# Patient Record
Sex: Female | Born: 1940 | Race: White | Hispanic: No | Smoking: Former smoker
Health system: Southern US, Community
[De-identification: ages and names within clinical notes are randomized; demographics above are authoritative.]

## PROBLEM LIST (undated history)

## (undated) DIAGNOSIS — F419 Anxiety disorder, unspecified: Secondary | ICD-10-CM

## (undated) DIAGNOSIS — E785 Hyperlipidemia, unspecified: Secondary | ICD-10-CM

## (undated) DIAGNOSIS — F329 Major depressive disorder, single episode, unspecified: Secondary | ICD-10-CM

## (undated) DIAGNOSIS — F32A Depression, unspecified: Secondary | ICD-10-CM

## (undated) DIAGNOSIS — G43909 Migraine, unspecified, not intractable, without status migrainosus: Secondary | ICD-10-CM

## (undated) DIAGNOSIS — G8929 Other chronic pain: Secondary | ICD-10-CM

## (undated) DIAGNOSIS — K559 Vascular disorder of intestine, unspecified: Secondary | ICD-10-CM

## (undated) DIAGNOSIS — H269 Unspecified cataract: Secondary | ICD-10-CM

## (undated) DIAGNOSIS — T7840XA Allergy, unspecified, initial encounter: Secondary | ICD-10-CM

## (undated) DIAGNOSIS — M1990 Unspecified osteoarthritis, unspecified site: Secondary | ICD-10-CM

## (undated) DIAGNOSIS — M545 Low back pain, unspecified: Secondary | ICD-10-CM

## (undated) DIAGNOSIS — I341 Nonrheumatic mitral (valve) prolapse: Secondary | ICD-10-CM

## (undated) DIAGNOSIS — K219 Gastro-esophageal reflux disease without esophagitis: Secondary | ICD-10-CM

## (undated) DIAGNOSIS — K589 Irritable bowel syndrome without diarrhea: Secondary | ICD-10-CM

## (undated) HISTORY — DX: Anxiety disorder, unspecified: F41.9

## (undated) HISTORY — DX: Vascular disorder of intestine, unspecified: K55.9

## (undated) HISTORY — PX: BREAST BIOPSY: SHX20

## (undated) HISTORY — PX: APPENDECTOMY: SHX54

## (undated) HISTORY — DX: Depression, unspecified: F32.A

## (undated) HISTORY — PX: EYE SURGERY: SHX253

## (undated) HISTORY — DX: Unspecified cataract: H26.9

## (undated) HISTORY — DX: Irritable bowel syndrome, unspecified: K58.9

## (undated) HISTORY — DX: Nonrheumatic mitral (valve) prolapse: I34.1

## (undated) HISTORY — PX: TONSILLECTOMY AND ADENOIDECTOMY: SHX28

## (undated) HISTORY — DX: Low back pain, unspecified: G89.29

## (undated) HISTORY — DX: Allergy, unspecified, initial encounter: T78.40XA

## (undated) HISTORY — DX: Low back pain, unspecified: M54.50

## (undated) HISTORY — DX: Unspecified osteoarthritis, unspecified site: M19.90

## (undated) HISTORY — DX: Major depressive disorder, single episode, unspecified: F32.9

## (undated) HISTORY — DX: Low back pain: M54.5

## (undated) HISTORY — PX: OTHER SURGICAL HISTORY: SHX169

## (undated) HISTORY — DX: Migraine, unspecified, not intractable, without status migrainosus: G43.909

## (undated) HISTORY — DX: Hyperlipidemia, unspecified: E78.5

---

## 1989-10-23 HISTORY — PX: ABDOMINAL HYSTERECTOMY: SHX81

## 1991-11-18 HISTORY — PX: HEMILAMINOTOMY LUMBAR SPINE: SUR654

## 1993-10-23 HISTORY — PX: OTHER SURGICAL HISTORY: SHX169

## 1997-08-18 HISTORY — PX: SPINAL FUSION: SHX223

## 2004-10-23 HISTORY — PX: ESOPHAGOGASTRODUODENOSCOPY: SHX1529

## 2005-10-23 ENCOUNTER — Encounter: Payer: Self-pay | Admitting: Family Medicine

## 2008-02-17 ENCOUNTER — Ambulatory Visit: Payer: Self-pay | Admitting: Family Medicine

## 2008-02-17 ENCOUNTER — Telehealth (INDEPENDENT_AMBULATORY_CARE_PROVIDER_SITE_OTHER): Payer: Self-pay | Admitting: *Deleted

## 2008-02-17 DIAGNOSIS — N809 Endometriosis, unspecified: Secondary | ICD-10-CM | POA: Insufficient documentation

## 2008-02-17 DIAGNOSIS — M5137 Other intervertebral disc degeneration, lumbosacral region: Secondary | ICD-10-CM | POA: Insufficient documentation

## 2008-02-17 DIAGNOSIS — F331 Major depressive disorder, recurrent, moderate: Secondary | ICD-10-CM | POA: Insufficient documentation

## 2008-02-17 DIAGNOSIS — F339 Major depressive disorder, recurrent, unspecified: Secondary | ICD-10-CM | POA: Insufficient documentation

## 2008-02-17 DIAGNOSIS — Z8719 Personal history of other diseases of the digestive system: Secondary | ICD-10-CM | POA: Insufficient documentation

## 2008-02-25 ENCOUNTER — Ambulatory Visit: Payer: Self-pay | Admitting: Family Medicine

## 2008-02-25 DIAGNOSIS — R310 Gross hematuria: Secondary | ICD-10-CM | POA: Insufficient documentation

## 2008-02-25 LAB — CONVERTED CEMR LAB
Nitrite: NEGATIVE
Protein, U semiquant: NEGATIVE
Urobilinogen, UA: NEGATIVE

## 2008-02-26 ENCOUNTER — Encounter: Payer: Self-pay | Admitting: Family Medicine

## 2008-03-30 ENCOUNTER — Encounter: Payer: Self-pay | Admitting: Family Medicine

## 2008-03-30 DIAGNOSIS — K219 Gastro-esophageal reflux disease without esophagitis: Secondary | ICD-10-CM | POA: Insufficient documentation

## 2008-03-30 DIAGNOSIS — K5730 Diverticulosis of large intestine without perforation or abscess without bleeding: Secondary | ICD-10-CM | POA: Insufficient documentation

## 2008-04-17 ENCOUNTER — Telehealth: Payer: Self-pay | Admitting: Family Medicine

## 2008-06-02 ENCOUNTER — Ambulatory Visit: Payer: Self-pay | Admitting: Family Medicine

## 2008-06-02 DIAGNOSIS — R5383 Other fatigue: Secondary | ICD-10-CM

## 2008-06-02 DIAGNOSIS — R5381 Other malaise: Secondary | ICD-10-CM | POA: Insufficient documentation

## 2008-06-02 DIAGNOSIS — R531 Weakness: Secondary | ICD-10-CM | POA: Insufficient documentation

## 2008-06-02 DIAGNOSIS — K589 Irritable bowel syndrome without diarrhea: Secondary | ICD-10-CM | POA: Insufficient documentation

## 2008-06-03 LAB — CONVERTED CEMR LAB
AST: 20 units/L (ref 0–37)
Alkaline Phosphatase: 96 units/L (ref 39–117)
BUN: 12 mg/dL (ref 6–23)
Calcium: 9.2 mg/dL (ref 8.4–10.5)
Chloride: 101 meq/L (ref 96–112)
Creatinine, Ser: 0.8 mg/dL (ref 0.40–1.20)
HCT: 41.8 % (ref 36.0–46.0)
HDL: 46 mg/dL (ref >39)
Hemoglobin: 13.5 g/dL (ref 12.0–15.0)
MCHC: 32.3 g/dL (ref 30.0–36.0)
MCV: 99.1 fL (ref 78.0–100.0)
RDW: 13.4 % (ref 11.5–15.5)
TSH: 1.374 microintl units/mL (ref 0.350–4.50)
Total Bilirubin: 0.5 mg/dL (ref 0.3–1.2)
Total CHOL/HDL Ratio: 4.3

## 2008-06-17 ENCOUNTER — Ambulatory Visit: Payer: Self-pay | Admitting: Family Medicine

## 2008-06-17 DIAGNOSIS — N390 Urinary tract infection, site not specified: Secondary | ICD-10-CM | POA: Insufficient documentation

## 2008-06-17 LAB — CONVERTED CEMR LAB
Bilirubin Urine: NEGATIVE
Glucose, Urine, Semiquant: NEGATIVE
Protein, U semiquant: 30
pH: 5.5

## 2008-07-10 ENCOUNTER — Encounter: Payer: Self-pay | Admitting: Family Medicine

## 2008-07-20 ENCOUNTER — Telehealth: Payer: Self-pay | Admitting: Family Medicine

## 2008-08-19 ENCOUNTER — Ambulatory Visit: Payer: Self-pay | Admitting: Family Medicine

## 2008-09-02 ENCOUNTER — Telehealth: Payer: Self-pay | Admitting: Family Medicine

## 2008-09-30 ENCOUNTER — Encounter: Payer: Self-pay | Admitting: Family Medicine

## 2009-01-08 ENCOUNTER — Encounter: Payer: Self-pay | Admitting: Family Medicine

## 2009-03-31 ENCOUNTER — Ambulatory Visit: Payer: Self-pay | Admitting: Family Medicine

## 2009-04-21 ENCOUNTER — Ambulatory Visit: Payer: Self-pay | Admitting: Family Medicine

## 2009-04-21 DIAGNOSIS — R413 Other amnesia: Secondary | ICD-10-CM | POA: Insufficient documentation

## 2009-04-22 ENCOUNTER — Encounter: Payer: Self-pay | Admitting: Family Medicine

## 2009-04-22 LAB — CONVERTED CEMR LAB: TSH: 0.683 microintl units/mL (ref 0.350–4.500)

## 2009-05-25 ENCOUNTER — Telehealth (INDEPENDENT_AMBULATORY_CARE_PROVIDER_SITE_OTHER): Payer: Self-pay | Admitting: *Deleted

## 2009-06-02 ENCOUNTER — Ambulatory Visit: Payer: Self-pay | Admitting: Family Medicine

## 2009-06-02 DIAGNOSIS — D649 Anemia, unspecified: Secondary | ICD-10-CM | POA: Insufficient documentation

## 2009-06-02 DIAGNOSIS — R1033 Periumbilical pain: Secondary | ICD-10-CM | POA: Insufficient documentation

## 2009-06-02 LAB — CONVERTED CEMR LAB
Bilirubin Urine: NEGATIVE
Glucose, Urine, Semiquant: NEGATIVE
Specific Gravity, Urine: <1.005
pH: 6

## 2009-06-03 ENCOUNTER — Encounter: Payer: Self-pay | Admitting: Family Medicine

## 2009-06-03 LAB — CONVERTED CEMR LAB
ALT: 15 units/L (ref 0–35)
AST: 20 units/L (ref 0–37)
Amylase: 55 units/L (ref 0–105)
CO2: 26 meq/L (ref 19–32)
Calcium: 9.2 mg/dL (ref 8.4–10.5)
Chloride: 100 meq/L (ref 96–112)
Cholesterol: 218 mg/dL — ABNORMAL HIGH (ref 0–200)
LDH: 263 units/L — ABNORMAL HIGH (ref 94–250)
Lipase: 20 units/L (ref 0–75)
Lymphs Abs: 1.3 10*3/uL (ref 0.7–4.0)
Monocytes Relative: 6 % (ref 3–12)
Neutro Abs: 6.9 10*3/uL (ref 1.7–7.7)
Neutrophils Relative %: 78 % — ABNORMAL HIGH (ref 43–77)
RBC: 4.19 M/uL (ref 3.87–5.11)
Sodium: 140 meq/L (ref 135–145)
Total Bilirubin: 0.3 mg/dL (ref 0.3–1.2)
Total Protein: 7.4 g/dL (ref 6.0–8.3)
VLDL: 30 mg/dL (ref 0–40)
WBC: 8.8 10*3/uL (ref 4.0–10.5)

## 2009-06-08 ENCOUNTER — Ambulatory Visit: Payer: Self-pay | Admitting: Family Medicine

## 2009-06-08 ENCOUNTER — Encounter: Payer: Self-pay | Admitting: Family Medicine

## 2009-06-08 LAB — CONVERTED CEMR LAB: OCCULT 3: NEGATIVE

## 2009-06-21 ENCOUNTER — Ambulatory Visit: Payer: Self-pay | Admitting: Family Medicine

## 2009-06-21 LAB — CONVERTED CEMR LAB
Blood in Urine, dipstick: NEGATIVE
Nitrite: NEGATIVE
Urobilinogen, UA: 0.2

## 2009-06-22 ENCOUNTER — Encounter: Payer: Self-pay | Admitting: Family Medicine

## 2009-07-12 ENCOUNTER — Encounter: Payer: Self-pay | Admitting: Family Medicine

## 2009-07-21 ENCOUNTER — Encounter: Payer: Self-pay | Admitting: Family Medicine

## 2009-07-23 ENCOUNTER — Telehealth: Payer: Self-pay | Admitting: Family Medicine

## 2009-11-16 ENCOUNTER — Ambulatory Visit: Payer: Self-pay | Admitting: Family Medicine

## 2009-11-16 DIAGNOSIS — R1013 Epigastric pain: Secondary | ICD-10-CM | POA: Insufficient documentation

## 2010-02-14 ENCOUNTER — Ambulatory Visit: Payer: Self-pay | Admitting: Family Medicine

## 2010-02-14 DIAGNOSIS — M169 Osteoarthritis of hip, unspecified: Secondary | ICD-10-CM | POA: Insufficient documentation

## 2010-02-14 DIAGNOSIS — M1610 Unilateral primary osteoarthritis, unspecified hip: Secondary | ICD-10-CM | POA: Insufficient documentation

## 2010-02-14 DIAGNOSIS — M1990 Unspecified osteoarthritis, unspecified site: Secondary | ICD-10-CM | POA: Insufficient documentation

## 2010-03-28 ENCOUNTER — Telehealth: Payer: Self-pay | Admitting: Family Medicine

## 2010-04-18 ENCOUNTER — Encounter: Payer: Self-pay | Admitting: Family Medicine

## 2010-04-28 ENCOUNTER — Telehealth: Payer: Self-pay | Admitting: Family Medicine

## 2010-06-03 ENCOUNTER — Ambulatory Visit: Payer: Self-pay | Admitting: Family Medicine

## 2010-06-06 ENCOUNTER — Telehealth: Payer: Self-pay | Admitting: Family Medicine

## 2010-06-30 ENCOUNTER — Encounter: Payer: Self-pay | Admitting: Family Medicine

## 2010-07-01 LAB — CONVERTED CEMR LAB
ALT: 14 units/L (ref 0–35)
AST: 17 units/L (ref 0–37)
Albumin: 4.3 g/dL (ref 3.5–5.2)
Alkaline Phosphatase: 90 units/L (ref 39–117)
LDL Cholesterol: 148 mg/dL — ABNORMAL HIGH (ref 0–99)
MCHC: 32.8 g/dL (ref 30.0–36.0)
Platelets: 346 10*3/uL (ref 150–400)
Potassium: 4.8 meq/L (ref 3.5–5.3)
RBC: 3.97 M/uL (ref 3.87–5.11)
RDW: 13.4 % (ref 11.5–15.5)
Sodium: 140 meq/L (ref 135–145)
Total Bilirubin: 0.3 mg/dL (ref 0.3–1.2)
Total Protein: 6.6 g/dL (ref 6.0–8.3)
VLDL: 26 mg/dL (ref 0–40)

## 2010-07-04 ENCOUNTER — Telehealth: Payer: Self-pay | Admitting: Family Medicine

## 2010-07-19 ENCOUNTER — Encounter: Payer: Self-pay | Admitting: Family Medicine

## 2010-07-21 ENCOUNTER — Telehealth: Payer: Self-pay | Admitting: Family Medicine

## 2010-08-03 ENCOUNTER — Ambulatory Visit: Payer: Self-pay | Admitting: Family Medicine

## 2010-08-17 ENCOUNTER — Ambulatory Visit (HOSPITAL_COMMUNITY): Payer: Self-pay | Admitting: Psychology

## 2010-08-24 ENCOUNTER — Ambulatory Visit (HOSPITAL_COMMUNITY): Payer: Self-pay | Admitting: Psychology

## 2010-08-26 ENCOUNTER — Ambulatory Visit: Payer: Self-pay | Admitting: Family Medicine

## 2010-08-31 ENCOUNTER — Ambulatory Visit (HOSPITAL_COMMUNITY): Payer: Self-pay | Admitting: Psychology

## 2010-09-01 ENCOUNTER — Telehealth: Payer: Self-pay | Admitting: Family Medicine

## 2010-09-22 ENCOUNTER — Telehealth (INDEPENDENT_AMBULATORY_CARE_PROVIDER_SITE_OTHER): Payer: Self-pay | Admitting: *Deleted

## 2010-10-18 ENCOUNTER — Encounter: Payer: Self-pay | Admitting: Family Medicine

## 2010-10-25 ENCOUNTER — Telehealth: Payer: Self-pay | Admitting: Family Medicine

## 2010-11-03 ENCOUNTER — Ambulatory Visit
Admission: RE | Admit: 2010-11-03 | Discharge: 2010-11-03 | Payer: Self-pay | Source: Home / Self Care | Attending: Family Medicine | Admitting: Family Medicine

## 2010-11-04 ENCOUNTER — Encounter: Payer: Self-pay | Admitting: Family Medicine

## 2010-11-04 LAB — CONVERTED CEMR LAB
Cholesterol: 211 mg/dL — ABNORMAL HIGH (ref 0–200)
HDL: 45 mg/dL (ref >39)
LDL Cholesterol: 137 mg/dL — ABNORMAL HIGH (ref 0–99)
Triglycerides: 147 mg/dL (ref <150)
VLDL: 29 mg/dL (ref 0–40)

## 2010-11-22 NOTE — Progress Notes (Signed)
Summary: diagnosis codes  Phone Note Call from Patient Call back at [phone]  ext [phone]   Caller: [facility] Summary of Call: need diagnosis for Lipid panel- Medicare not covering the code v77.91 v77.99 Initial call taken by: [name] LPN,  July 21, 2010 4:08 PM  Follow-up for Phone Call        That's strange.  It had been a full 12 mos since the last one and it was covered under this code last year.  I really don't have another code to use. Follow-up by: Seymour Bars DO,  July 22, 2010 8:22 AM  Additional Follow-up for Phone Call Additional follow up Details #1::        Notified [facility] no other codes available for this lipid panel Additional Follow-up by: [name] LPN,  July 22, 2010 8:24 AM

## 2010-11-22 NOTE — Progress Notes (Signed)
Summary: Alprazolam refill  Phone Note Refill Request   Refills Requested: Medication #1:  ALPRAZOLAM 0.5 MG TABS 1 tab by mouth two times a day as needed anxiety Initial call taken by: Payton Spark CMA,  July 04, 2010 8:53 AM    Prescriptions: ALPRAZOLAM 0.5 MG TABS (ALPRAZOLAM) 1 tab by mouth two times a day as needed anxiety  #60 x 0   Entered and Authorized by:   Nani Gasser MD   Signed by:   Nani Gasser MD on 07/04/2010   Method used:   Printed then faxed to ...       [facility] [phone]* (retail)       [address] [place], [place]  [address]       Ph: [phone]       Fax: [phone]   RxID:   [id number]

## 2010-11-22 NOTE — Letter (Signed)
Summary: Depression & Anxiety Questionnaire  Depression & Anxiety Questionnaire   Imported By: Lanelle Bal 09/07/2010 11:07:33  _____________________________________________________________________  External Attachment:    Type:   Image     Comment:   External Document

## 2010-11-22 NOTE — Progress Notes (Signed)
Summary: Xanax refills  Phone Note Call from Patient   Caller: Patient Summary of Call: Pt LMOM stating her husband Leanna Sato) passed away on 03/28/10. Pt states anxiety is really bad right now and can't wait for Xanax to come in from mail order pharm. Pt would like to know if you will send 30 day supply before she orders from mail order. Please advise. Initial call taken by: Payton Spark CMA,  March 28, 2010 11:07 AM    Prescriptions: ALPRAZOLAM 0.5 MG TABS (ALPRAZOLAM) 1 tab by mouth two times a day as needed anxiety  #40 x 0   Entered and Authorized by:   Seymour Bars DO   Signed by:   Seymour Bars DO on 03/28/2010   Method used:   Printed then faxed to ...       [facility] [phone]* (retail)       [address] [place], [place]  [address]       Ph: [phone]       Fax: [phone]   RxID:   [id number]

## 2010-11-22 NOTE — Letter (Signed)
Summary: [facility]  [facility]   Imported By: Lanelle Bal 07/28/2010 10:31:03  _____________________________________________________________________  External Attachment:    Type:   Image     Comment:   External Document

## 2010-11-22 NOTE — Progress Notes (Signed)
Summary: Alprazolam refill  Phone Note Refill Request   Refills Requested: Medication #1:  ALPRAZOLAM 0.5 MG TABS 1 tab by mouth two times a day as needed anxiety Initial call taken by: Payton Spark CMA,  April 28, 2010 1:06 PM    Prescriptions: ALPRAZOLAM 0.5 MG TABS (ALPRAZOLAM) 1 tab by mouth two times a day as needed anxiety  #40 x 0   Entered and Authorized by:   Seymour Bars DO   Signed by:   Seymour Bars DO on 04/28/2010   Method used:   Printed then faxed to ...       [facility] [phone]* (retail)       [address] [place], [place]  [address]       Ph: [phone]       Fax: [phone]   RxID:   [id number]

## 2010-11-22 NOTE — Assessment & Plan Note (Signed)
Summary: f/u mood   Vital Signs:  Patient profile:   70 year old female Height:      66.5 inches Weight:      146 pounds BMI:     23.30 O2 Sat:      96 % on Room air Pulse rate:   83 / minute BP sitting:   109 / 67  (left arm) Cuff size:   regular  Vitals Entered By: Payton Spark CMA (June 03, 2010 1:39 PM)  O2 Flow:  Room air CC: F/U mood.    Primary Care Provider:  Seymour Bars DO  CC:  F/U mood. Marland Kitchen  History of Present Illness: 70 yo WF presents for f/u depression.  She lost her husband in June after a head injury.  She is grieving but she is improving a little bit.    She takes Effexor 75 mg two times a day and Trazadone at night with Xanax use two times a day.  She has a good support system.  She has found herself being nervous when away from home.  She has not done any therapy yet.  This is keeping her from seeing her grandchildren.    She is reading a book about the loss of a spouse.    She is heading to [place] in a wk with her sister.     Current Medications (verified): 1)  Venlafaxine Hcl 75 Mg Tabs (Venlafaxine Hcl) .Marland Kitchen.. 1 Tab By Mouth Bid 2)  Hydrocodone-Acetaminophen 10-500 Mg  Tabs (Hydrocodone-Acetaminophen) .... Take 1 Tablet By Mouth Three Times A Day 3)  Soma 350 Mg  Tabs (Carisoprodol) .... Take 1 Tablet By Mouth Four Times A Day 4)  Caltrate 600+d Plus 600-400 Mg-Unit  Tabs (Calcium Carbonate-Vit D-Min) .... Take Two By Mouth Two Times A Day 5)  Fish Oil 1200 Mg  Caps (Omega-3 Fatty Acids) .... Take 1 Tablet By Mouth Once A Day 6)  Glucosamine Relief 500 Mg  Caps (Glucosamine Sulfate) .... Take 1 Tablet By Mouth Two Times A Day 7)  Trazodone Hcl 50 Mg Tabs (Trazodone Hcl) .Marland Kitchen.. 1 By Mouth At Bedtime 8)  Alprazolam 0.5 Mg Tabs (Alprazolam) .Marland Kitchen.. 1 Tab By Mouth Two Times A Day As Needed Anxiety 9)  Omeprazole 20 Mg Cpdr (Omeprazole) .Marland Kitchen.. 1 Tab By Mouth Daily  Allergies (verified): 1)  ! Morphine  Past History:  Past Medical History: Reviewed  history from 06/02/2009 and no changes required. MVP depression chronic LBP, lumbar DDD endometriosis. [id number] migraines hematuria ischemic colitis hyperlipidemia  hx of SCC and BCC  gyn: Dr Ruby Cola  Past Surgical History: Reviewed history from 03/30/2008 and no changes required. TAH with bilat oophorectomy in 1991 for endometriosis back surgery 1993 laparasocpic laser sugery 1992, 1994 lapartomy 1995 spinal fusion 1998 T&A appendectomy R breast biopsy - benign  Social History: 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 Has 2 kids - son in [place] with 2 kids and daughter in [place] with 1 child. Quit smoking 1982 Denies ETOH. Good diet.   Plants to start going to the [facility].  Review of Systems Psych:  Complains of easily tearful; denies alternate hallucination ( auditory/visual), anxiety, depression, easily angered, irritability, panic attacks, suicidal thoughts/plans, thoughts of violence, unusual visions or sounds, and thoughts /plans of harming others.  Physical Exam  General:  alert, well-developed, well-nourished, and well-hydrated.   Psych:  good eye contact, not anxious appearing, and not depressed appearing.     Impression & Recommendations:  Problem # 1:  DEPRESSION (ICD-311) PHQ9 score of 11 but she is <2 mos s/p grief reaction after the loss of her husband.  She has a good support system.  She declined counseling.  She wants to stay on current meds.    F/U in 2 mos.   Her updated medication list for this problem includes:    Venlafaxine Hcl 75 Mg Tabs (Venlafaxine hcl) .Marland Kitchen... 1 tab by mouth bid    Trazodone Hcl 50 Mg Tabs (Trazodone hcl) .Marland Kitchen... 1 by mouth at bedtime    Alprazolam 0.5 Mg Tabs (Alprazolam) .Marland Kitchen... 1 tab by mouth two times a day as needed anxiety  Complete Medication List: 1)  Venlafaxine Hcl 75 Mg Tabs (Venlafaxine hcl) .Marland Kitchen.. 1 tab by mouth bid 2)  Hydrocodone-acetaminophen 10-500 Mg Tabs  (Hydrocodone-acetaminophen) .... Take 1 tablet by mouth three times a day 3)  Soma 350 Mg Tabs (Carisoprodol) .... Take 1 tablet by mouth four times a day 4)  Caltrate 600+d Plus 600-400 Mg-unit Tabs (Calcium carbonate-vit d-min) .... Take two by mouth two times a day 5)  Fish Oil 1200 Mg Caps (Omega-3 fatty acids) .... Take 1 tablet by mouth once a day 6)  Glucosamine Relief 500 Mg Caps (Glucosamine sulfate) .... Take 1 tablet by mouth two times a day 7)  Trazodone Hcl 50 Mg Tabs (Trazodone hcl) .Marland Kitchen.. 1 by mouth at bedtime 8)  Alprazolam 0.5 Mg Tabs (Alprazolam) .Marland Kitchen.. 1 tab by mouth two times a day as needed anxiety 9)  Omeprazole 20 Mg Cpdr (Omeprazole) .Marland Kitchen.. 1 tab by mouth daily  Other Orders: T-Comprehensive Metabolic Panel [id number]) T-Lipid Profile ([id number]) T-CBC No Diff ([id number])  Patient Instructions: 1)  Update fasting labs at your convenience. 2)  Will call you w/ results. 3)  Consider adding counseling or going up on your venlaxafine dose. 4)  Call me if any problems. 5)  Have fun on your trip. 6)  Return for f/u mood in 2 mos/ flu shot. Prescriptions: ALPRAZOLAM 0.5 MG TABS (ALPRAZOLAM) 1 tab by mouth two times a day as needed anxiety  #60 x 0   Entered and Authorized by:   Seymour Bars DO   Signed by:   Seymour Bars DO on 06/03/2010   Method used:   Print then Give to Patient   RxID:   [id number]

## 2010-11-22 NOTE — Progress Notes (Signed)
Summary: xanax refill  Phone Note Call from Patient Call back at [phone]   Caller: Patient Call For: [facility] Summary of Call: Pt out of town at daughters and will be back in for the weekend then gone all next week and weekend to help daughter out because she is having surgery. Needs refill on her Xanax but not due until Dec 10th but will not be in town to get it then so wanted to know if you would send it in to pharmacy so she could pick up while in town tomorrow or saturday. Please let pt know Initial call taken by: [name] LPN,  September 22, 2010 9:59 AM  Follow-up for Phone Call        Rx Called In Follow-up by: Nani Gasser MD,  September 22, 2010 10:56 AM    Prescriptions: ALPRAZOLAM 0.5 MG TABS (ALPRAZOLAM) 1 tab by mouth two times a day as needed anxiety  #60 x 0   Entered and Authorized by:   Nani Gasser MD   Signed by:   Nani Gasser MD on 09/22/2010   Method used:   Printed then faxed to ...       [facility] [phone]* (retail)       [address] [place], [place]  [address]       Ph: [phone]       Fax: [phone]   RxID:   [id number]

## 2010-11-22 NOTE — Letter (Signed)
Summary: Records Dated 12-01-08 thru 10-27-09/[facility]  Records Dated 12-01-08 thru 10-27-09/[facility]   Imported By: Lanelle Bal 02/28/2010 09:38:33  _____________________________________________________________________  External Attachment:    Type:   Image     Comment:   External Document

## 2010-11-22 NOTE — Letter (Signed)
Summary: Depression Questionnaire  Depression Questionnaire   Imported By: Lanelle Bal 06/14/2010 11:56:40  _____________________________________________________________________  External Attachment:    Type:   Image     Comment:   External Document

## 2010-11-22 NOTE — Progress Notes (Signed)
Summary: refill  Phone Note Refill Request Message from:  Patient on September 01, 2010 9:48 AM  Refills Requested: Medication #1:  ALPRAZOLAM 0.5 MG TABS 1 tab by mouth two times a day as needed anxiety   Supply Requested: 1 month Fax to [facility] in [place]   Method Requested: Fax to Local Pharmacy Initial call taken by: [name] LPN,  September 01, 2010 9:48 AM    Prescriptions: ALPRAZOLAM 0.5 MG TABS (ALPRAZOLAM) 1 tab by mouth two times a day as needed anxiety  #60 x 0   Entered and Authorized by:   Nani Gasser MD   Signed by:   Nani Gasser MD on 09/01/2010   Method used:   Printed then faxed to ...       [facility] [phone]* (retail)       [address] [place], [place]  [address]       Ph: [phone]       Fax: [phone]   RxID:   [id number]

## 2010-11-22 NOTE — Letter (Signed)
Summary: [facility]  [facility]   Imported By: Lanelle Bal 04/27/2010 09:27:21  _____________________________________________________________________  External Attachment:    Type:   Image     Comment:   External Document

## 2010-11-22 NOTE — Assessment & Plan Note (Signed)
Summary: epigastric pain   Vital Signs:  Patient profile:   70 year old female Height:      66.5 inches Weight:      148 pounds BMI:     23.62 O2 Sat:      98 % on Room air Temp:     98.3 degrees F oral Pulse rate:   93 / minute BP sitting:   124 / 72  (left arm) Cuff size:   regular  Vitals Entered By: Payton Spark CMA (November 16, 2009 10:38 AM)  O2 Flow:  Room air CC: Stomach pain    Primary Care Provider:  Seymour Bars DO  CC:  Stomach pain .  History of Present Illness: 70 yo WF presents for problems with chronic abdominal pain.  She has had a lengthy w/u with Dr Marcelene Butte in [place].  She has had CTs, colonoscopy and endoscopy all in the past year.  She has had some chronic constipation.  She is taking Miralax.  She has had chronic epigastric pain.  She has been on PPIs, probiotics and aloe water but nothing helps.  She had a gastrointestinal bug 2 wks ago.    The pain is not worse after eating.  She is taking Omeprazole daily.  No melena or hematochezia.  She is taking chronic pain meds and iron which sometimes constipate her.  She notes that taking Alprazolam relieves her epigastric pain.  Denies N/V/D.  Not taking any NSAIDs.  Current Medications (verified): 1)  Venlafaxine Hcl 75 Mg Tabs (Venlafaxine Hcl) .Marland Kitchen.. 1 Tab By Mouth Bid 2)  Hydrocodone-Acetaminophen 10-500 Mg  Tabs (Hydrocodone-Acetaminophen) .... Take 1 Tablet By Mouth Three Times A Day 3)  Soma 350 Mg  Tabs (Carisoprodol) .... Take 1 Tablet By Mouth Four Times A Day 4)  Caltrate 600+d Plus 600-400 Mg-Unit  Tabs (Calcium Carbonate-Vit D-Min) .... Take Two By Mouth Two Times A Day 5)  Fish Oil 1200 Mg  Caps (Omega-3 Fatty Acids) .... Take 1 Tablet By Mouth Once A Day 6)  Glucosamine Relief 500 Mg  Caps (Glucosamine Sulfate) .... Take 1 Tablet By Mouth Two Times A Day 7)  Co Q-10 100 Mg  Caps (Coenzyme Q10) .... Take 1 Tablet By Mouth Once A Day 8)  Trazodone Hcl 50 Mg Tabs (Trazodone Hcl) .Marland Kitchen.. 1 By Mouth At  Bedtime 9)  Alprazolam 0.5 Mg Tabs (Alprazolam) .Marland Kitchen.. 1 Tab By Mouth Two Times A Day As Needed Anxiety 10)  Omeprazole 40 Mg Cpdr (Omeprazole) .Marland Kitchen.. 1 Tab By Mouth Daily, Take 30 Min Before Breakfast  Allergies (verified): 1)  ! Morphine  Past History:  Past Medical History: Reviewed history from 06/02/2009 and no changes required. MVP depression chronic LBP, lumbar DDD endometriosis. [id number] migraines hematuria ischemic colitis hyperlipidemia  hx of SCC and BCC  gyn: Dr Ruby Cola  Past Surgical History: Reviewed history from 03/30/2008 and no changes required. TAH with bilat oophorectomy in 1991 for endometriosis back surgery 1993 laparasocpic laser sugery 1992, 1994 lapartomy 1995 spinal fusion 1998 T&A appendectomy R breast biopsy - benign  Social History: Reviewed history from 02/17/2008 and no changes required. 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 Has 2 kids - son in [place] with 2 kids and daughter in [place] with 1 child. Quit smoking 1982 Denies ETOH. Good diet.   Plants to start going to the [facility].  Review of Systems General:  Complains of fatigue; denies fever, loss of appetite, sleep disorder, sweats, weakness, and weight loss. GI:  Complains of abdominal pain, constipation, gas, and indigestion; denies bloody stools, change in bowel habits, dark tarry stools, diarrhea, loss of appetite, nausea, and vomiting. GU:  Denies dysuria.  Physical Exam  General:  alert, well-developed, well-nourished, and well-hydrated.   Head:  normocephalic and atraumatic.   Eyes:  sclera non icteric Nose:  no nasal discharge.   Mouth:  pharynx pink and moist.   Neck:  no masses.   Lungs:  Normal respiratory effort, chest expands symmetrically. Lungs are clear to auscultation, no crackles or wheezes. Heart:  normal rate, regular rhythm, no murmur, and systolic click.   Abdomen:  epigastric TTP w/o R/G/R.  No HSM.  NABS.  ND.   Extremities:   no LE edema Skin:  color normal.   Cervical Nodes:  No lymphadenopathy noted Psych:  good eye contact, not anxious appearing, and not depressed appearing.     Impression & Recommendations:  Problem # 1:  EPIGASTRIC PAIN (ICD-789.06) Reassured pt of normal CT abd/ pelvis, EGD and colonoscopy all done by Dr Marcelene Butte in the past year.  On daily PPI. Her symptoms can best be described as stress induced gastritis -- Alprazolam relieves her symptoms.  I RFd this for her along with an RX for hydroxyzine for anxiety.  She can try either one to see if epigastric pain improves.  If unchanged in 2 wks, will proceed with a urease breath test for H. Pylori.  We discussed the role of relaxation, dietary changes and meditation.  Pt agrees that her symptoms are directly related to stress.  Complete Medication List: 1)  Venlafaxine Hcl 75 Mg Tabs (Venlafaxine hcl) .Marland Kitchen.. 1 tab by mouth bid 2)  Hydrocodone-acetaminophen 10-500 Mg Tabs (Hydrocodone-acetaminophen) .... Take 1 tablet by mouth three times a day 3)  Soma 350 Mg Tabs (Carisoprodol) .... Take 1 tablet by mouth four times a day 4)  Caltrate 600+d Plus 600-400 Mg-unit Tabs (Calcium carbonate-vit d-min) .... Take two by mouth two times a day 5)  Fish Oil 1200 Mg Caps (Omega-3 fatty acids) .... Take 1 tablet by mouth once a day 6)  Glucosamine Relief 500 Mg Caps (Glucosamine sulfate) .... Take 1 tablet by mouth two times a day 7)  Co Q-10 100 Mg Caps (Coenzyme q10) .... Take 1 tablet by mouth once a day 8)  Trazodone Hcl 50 Mg Tabs (Trazodone hcl) .Marland Kitchen.. 1 by mouth at bedtime 9)  Alprazolam 0.5 Mg Tabs (Alprazolam) .Marland Kitchen.. 1 tab by mouth two times a day as needed anxiety 10)  Omeprazole 40 Mg Cpdr (Omeprazole) .Marland Kitchen.. 1 tab by mouth daily, take 30 min before breakfast 11)  Hydroxyzine Hcl 50 Mg Tabs (Hydroxyzine hcl) .Marland Kitchen.. 1 tab by mouth q 6 hrs as needed anxiety  Patient Instructions: 1)  For stress induced gastritis, 2)  Stay on current meds. 3)  Take 1  tab of Hydroxyzine after breakfast.   4)  The combo of this + Alprazolam may make you drowsy. 5)  Work on relaxation, bland diet. 6)  High fiber, plenty of water. 7)  If not improved in 10 days, call and we'll get you tested for H. Pylori. Prescriptions: ALPRAZOLAM 0.5 MG TABS (ALPRAZOLAM) 1 tab by mouth two times a day as needed anxiety  #60 x 0   Entered and Authorized by:   Seymour Bars DO   Signed by:   Seymour Bars DO on 11/16/2009   Method used:   Printed then faxed to ...       [facility] [phone]* (retail)       [address] [place], [place]  [address]       Ph: [phone]       Fax: [phone]   RxID:   [id number] HYDROXYZINE HCL 50 MG TABS (HYDROXYZINE HCL) 1 tab by mouth q 6 hrs as needed anxiety  #30 x 0   Entered and Authorized by:   Seymour Bars DO   Signed by:   Seymour Bars DO on 11/16/2009   Method used:   Electronically to        [facility] [phone]* (retail)       [address] [place], [place]  [address]       Ph: [phone]       Fax: [phone]   RxID:   [id number]

## 2010-11-22 NOTE — Assessment & Plan Note (Signed)
Summary: f/u mood   Vital Signs:  Patient profile:   70 year old female Height:      66.5 inches Weight:      143 pounds BMI:     22.82 O2 Sat:      98 % on Room air Pulse rate:   98 / minute BP sitting:   114 / 70  (left arm) Cuff size:   regular  Vitals Entered By: Payton Spark CMA (August 26, 2010 9:44 AM)  O2 Flow:  Room air CC: More frequent panic attacks and "melt downs"   Primary Care Jaslin Novitski:  Seymour Bars DO  CC:  More frequent panic attacks and "melt downs".  History of Present Illness: 70 yo WF presents for problems with her mood.  She is having more frequent panic attacks even on her meds.  She lost her husband a few month ago and just started counseling downstairs.  She is on Effexor 100 mg two times a day, Trazadone 50 mg at bedtime and Xanax 0.5 mg two times a day which she is using more of it.  She has been more tearful and having panic attacks.  She had refused to see a psychiatrist in the past.  She has a good support system.  She is upset with the holidays coming up.      Allergies: 1)  ! Morphine  Past History:  Past Medical History: Reviewed history from 06/02/2009 and no changes required. MVP depression chronic LBP, lumbar DDD endometriosis. [id number] migraines hematuria ischemic colitis hyperlipidemia  hx of SCC and BCC  gyn: Dr Ruby Cola  Past Surgical History: Reviewed history from 03/30/2008 and no changes required. TAH with bilat oophorectomy in 1991 for endometriosis back surgery 1993 laparasocpic laser sugery 1992, 1994 lapartomy 1995 spinal fusion 1998 T&A appendectomy R breast biopsy - benign  Family History: Reviewed history from 02/17/2008 and no changes required. mother died Alzheimers in her 66s father died ACS?, in his 80's sister Ataxia of unknown cause  Social History: Reviewed history from 06/03/2010 and no changes required. 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 Has 2 kids - son in  [place] with 2 kids and daughter in [place] with 1 child. Quit smoking 1982 Denies ETOH. Good diet.   Plants to start going to the [facility].  Review of Systems Psych:  Complains of anxiety, depression, and easily tearful; denies irritability and suicidal thoughts/plans.  Physical Exam  General:  alert, well-developed, well-nourished, and well-hydrated.   Psych:  good eye contact, not anxious appearing, and depressed affect.     Impression & Recommendations:  Problem # 1:  DEPRESSION (ICD-311) Assessment Deteriorated Grief reaction and upcoming holidays has worsened her mood some.  She has a good support system and has started counseling.  She is enjoying time with her grandkids and has thought about what would make her happy.  She denies any suidical thoughts.  Will incrase her Venlaxafine to 1.5 tabs two times a day (300 mg/ day total) and change her Trazadone to Seroquel at night.  Call if any problems.  Not due for Xanax.  Continue coiunseling.  F/U in 2 mos.   Her updated medication list for this problem includes:    Venlafaxine Hcl 100 Mg Tabs (Venlafaxine hcl) .Marland Kitchen... 1.5 tab by mouth two times a day    Alprazolam 0.5 Mg Tabs (Alprazolam) .Marland Kitchen... 1 tab by mouth two times a day as needed anxiety  Complete Medication List: 1)  Venlafaxine Hcl 100 Mg  Tabs (Venlafaxine hcl) .... 1.5 tab by mouth two times a day 2)  Hydrocodone-acetaminophen 10-500 Mg Tabs (Hydrocodone-acetaminophen) .... Take 1 tablet by mouth three times a day 3)  Soma 350 Mg Tabs (Carisoprodol) .... Take 1 tablet by mouth four times a day 4)  Caltrate 600+d Plus 600-400 Mg-unit Tabs (Calcium carbonate-vit d-min) .... Take two by mouth two times a day 5)  Fish Oil 1200 Mg Caps (Omega-3 fatty acids) .... Take 1 tablet by mouth once a day 6)  Glucosamine Relief 500 Mg Caps (Glucosamine sulfate) .... Take 1 tablet by mouth two times a day 7)  Seroquel Xr 50 Mg Xr24h-tab (Quetiapine fumarate) .Marland Kitchen.. 1 tab by mouth q  pm 8)  Alprazolam 0.5 Mg Tabs (Alprazolam) .Marland Kitchen.. 1 tab by mouth two times a day as needed anxiety 9)  Omeprazole 20 Mg Cpdr (Omeprazole) .Marland Kitchen.. 1 tab by mouth daily  Patient Instructions: 1)  Go up on Effexor (Venlaxafine) to 1.5 tabs by mouth two times a day. 2)  Change Trazadone to Seroquel XR -- take 1 tab at 7 or 8 pm every night. 3)  Use Alprazolam as needed -- do not exceed RX dose. 4)  Continue counseling. 5)  REturn for f/u depression in 6 wks. 6)  Call if any problems. Prescriptions: VENLAFAXINE HCL 100 MG TABS (VENLAFAXINE HCL) 1.5 tab by mouth two times a day  #90 x 2   Entered and Authorized by:   Seymour Bars DO   Signed by:   Seymour Bars DO on 08/26/2010   Method used:   Electronically to        [facility] [phone]* (retail)       [address] [place], [place]  [address]       Ph: [phone]       Fax: [phone]   RxID:   [id number] SEROQUEL XR 50 MG XR24H-TAB (QUETIAPINE FUMARATE) 1 tab by mouth q PM  #30 x 2   Entered and Authorized by:   Seymour Bars DO   Signed by:   Seymour Bars DO on 08/26/2010   Method used:   Electronically to        [facility] [phone]* (retail)       [address] [place], [place]  [address]       Ph: [phone]       Fax: [phone]   RxID:   [id number]    Orders Added: 1)  Est. Patient Level III [22025]

## 2010-11-22 NOTE — Letter (Signed)
Summary: Anxiety & Depression Questionnaire  Anxiety & Depression Questionnaire   Imported By: Lanelle Bal 08/12/2010 15:59:38  _____________________________________________________________________  External Attachment:    Type:   Image     Comment:   External Document

## 2010-11-22 NOTE — Assessment & Plan Note (Signed)
Summary: f/u mood   Vital Signs:  Patient profile:   70 year old female Height:      66.5 inches Weight:      145 pounds BMI:     23.14 O2 Sat:      95 % on Room air Pulse rate:   97 / minute BP sitting:   119 / 73  (right arm) Cuff size:   regular  Vitals Entered By: Payton Spark CMA (August 03, 2010 10:31 AM)  O2 Flow:  Room air CC: F/U mood and anxiety   Primary Care Stormie Ventola:  Seymour Bars DO  CC:  F/U mood and anxiety.  History of Present Illness: 70 yo WF presents for f/u depression and anxiety.    She lost her husband in June.  She goes to church regularly and has a best friend and talks to [employer] and her son almost everyday.  Her family is supportive.  She noticed some improvements with higher dose of Effexor.  Her PHQ-9 is a 9.  Her GAD score is a 6.    She has decided to go ahead and do counseling.    Current Medications (verified): 1)  Venlafaxine Hcl 100 Mg Tabs (Venlafaxine Hcl) .Marland Kitchen.. 1 Tab By Mouth Bid 2)  Hydrocodone-Acetaminophen 10-500 Mg  Tabs (Hydrocodone-Acetaminophen) .... Take 1 Tablet By Mouth Three Times A Day 3)  Soma 350 Mg  Tabs (Carisoprodol) .... Take 1 Tablet By Mouth Four Times A Day 4)  Caltrate 600+d Plus 600-400 Mg-Unit  Tabs (Calcium Carbonate-Vit D-Min) .... Take Two By Mouth Two Times A Day 5)  Fish Oil 1200 Mg  Caps (Omega-3 Fatty Acids) .... Take 1 Tablet By Mouth Once A Day 6)  Glucosamine Relief 500 Mg  Caps (Glucosamine Sulfate) .... Take 1 Tablet By Mouth Two Times A Day 7)  Trazodone Hcl 50 Mg Tabs (Trazodone Hcl) .Marland Kitchen.. 1 By Mouth At Bedtime 8)  Alprazolam 0.5 Mg Tabs (Alprazolam) .Marland Kitchen.. 1 Tab By Mouth Two Times A Day As Needed Anxiety 9)  Omeprazole 20 Mg Cpdr (Omeprazole) .Marland Kitchen.. 1 Tab By Mouth Daily  Allergies (verified): 1)  ! Morphine  Past History:  Past Medical History: Reviewed history from 06/02/2009 and no changes required. MVP depression chronic LBP, lumbar  DDD endometriosis. [id number] migraines hematuria ischemic colitis hyperlipidemia  hx of SCC and BCC  gyn: Dr Ruby Cola  Social History: Reviewed history from 06/03/2010 and no changes required. 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 Has 2 kids - son in [place] with 2 kids and daughter in [place] with 1 child. Quit smoking 1982 Denies ETOH. Good diet.   Plants to start going to the [facility].  Review of Systems      See HPI  Physical Exam  General:  alert, well-developed, well-nourished, and well-hydrated.   Skin:  color normal.   Psych:  good eye contact, not anxious appearing, and not depressed appearing.     Impression & Recommendations:  Problem # 1:  DEPRESSION (ICD-311)  PHQ9 score of 9 and GAD score of 6.   Will continue current meds.  Add counseling.  Has a good support system.  Mood worsened by grief reaction. Her updated medication list for this problem includes:    Venlafaxine Hcl 100 Mg Tabs (Venlafaxine hcl) .Marland Kitchen... 1 tab by mouth bid    Trazodone Hcl 50 Mg Tabs (Trazodone hcl) .Marland Kitchen... 1 by mouth at bedtime    Alprazolam 0.5 Mg Tabs (Alprazolam) .Marland Kitchen... 1 tab by mouth  two times a day as needed anxiety  Orders: Psychology Referral (Psychology)  Complete Medication List: 1)  Venlafaxine Hcl 100 Mg Tabs (Venlafaxine hcl) .Marland Kitchen.. 1 tab by mouth bid 2)  Hydrocodone-acetaminophen 10-500 Mg Tabs (Hydrocodone-acetaminophen) .... Take 1 tablet by mouth three times a day 3)  Soma 350 Mg Tabs (Carisoprodol) .... Take 1 tablet by mouth four times a day 4)  Caltrate 600+d Plus 600-400 Mg-unit Tabs (Calcium carbonate-vit d-min) .... Take two by mouth two times a day 5)  Fish Oil 1200 Mg Caps (Omega-3 fatty acids) .... Take 1 tablet by mouth once a day 6)  Glucosamine Relief 500 Mg Caps (Glucosamine sulfate) .... Take 1 tablet by mouth two times a day 7)  Trazodone Hcl 50 Mg Tabs (Trazodone hcl) .Marland Kitchen.. 1 by mouth at bedtime 8)  Alprazolam 0.5 Mg Tabs  (Alprazolam) .Marland Kitchen.. 1 tab by mouth two times a day as needed anxiety 9)  Omeprazole 20 Mg Cpdr (Omeprazole) .Marland Kitchen.. 1 tab by mouth daily  Other Orders: Flu Vaccine 79yrs + MEDICARE PATIENTS (V4098) Administration Flu vaccine - MCR (J1914) Flu Vaccine Consent Questions     Do you have a history of severe allergic reactions to this vaccine? no    Any prior history of allergic reactions to egg and/or gelatin? no    Do you have a sensitivity to the preservative Thimersol? no    Do you have a past history of Guillan-Barre Syndrome? no    Do you currently have an acute febrile illness? no    Have you ever had a severe reaction to latex? no    Vaccine information given and explained to patient? yes    Are you currently pregnant? no    Lot Number:[id number]   Exp Date:04/22/2011   Site Given  Left Deltoid IMccine - MCR ([id number])  Patient Instructions: 1)  Stay on current meds. 2)  Will add counseling downstairs. 3)  Return for PHYSICAL in 3 mos. Prescriptions: ALPRAZOLAM 0.5 MG TABS (ALPRAZOLAM) 1 tab by mouth two times a day as needed anxiety  #60 x 0   Entered and Authorized by:   Seymour Bars DO   Signed by:   Seymour Bars DO on 08/03/2010   Method used:   Printed then faxed to ...       [facility] [phone]* (retail)       [address] [place], [place]  [address]       Ph: [phone]       Fax: [phone]   RxID:   [id number]    .lbmedflu

## 2010-11-22 NOTE — Assessment & Plan Note (Signed)
Summary: f/u mood and med refills//mpm   Vital Signs:  Patient profile:   70 year old female Height:      66.5 inches Weight:      149 pounds BMI:     23.77 O2 Sat:      98 % on Room air Pulse rate:   76 / minute BP sitting:   123 / 76  (left arm) Cuff size:   regular  Vitals Entered By: Payton Spark CMA (February 14, 2010 9:38 AM)  O2 Flow:  Room air CC: F/U mood. Rx refills.   Primary Care Provider:  Seymour Bars DO  CC:  F/U mood. Rx refills..  History of Present Illness: 70 yo WF presents for f/u depression.  She is using Xanax sparingly for anxiety and Effexor 75 mg two times a day which has really been helping.  Under stress with her husband and her daughter.    She has been having RLQ pain on and off x 2 wks with some urgency to void.  Has tried heat/ ice but this does not help.  She has IBS with constipation and diarrhea alternating.  Had a normal colonoscopy in 2006.  Has been seeing Dr Gweneth Fritter all along (GI).  Due for RFs today.  She is alos having a R sided groin pain at night.  Not taking anything.  Hx of osteoarthritis.  Current Medications (verified): 1)  Venlafaxine Hcl 75 Mg Tabs (Venlafaxine Hcl) .Marland Kitchen.. 1 Tab By Mouth Bid 2)  Hydrocodone-Acetaminophen 10-500 Mg  Tabs (Hydrocodone-Acetaminophen) .... Take 1 Tablet By Mouth Three Times A Day 3)  Soma 350 Mg  Tabs (Carisoprodol) .... Take 1 Tablet By Mouth Four Times A Day 4)  Caltrate 600+d Plus 600-400 Mg-Unit  Tabs (Calcium Carbonate-Vit D-Min) .... Take Two By Mouth Two Times A Day 5)  Fish Oil 1200 Mg  Caps (Omega-3 Fatty Acids) .... Take 1 Tablet By Mouth Once A Day 6)  Glucosamine Relief 500 Mg  Caps (Glucosamine Sulfate) .... Take 1 Tablet By Mouth Two Times A Day 7)  Co Q-10 100 Mg  Caps (Coenzyme Q10) .... Take 1 Tablet By Mouth Once A Day 8)  Trazodone Hcl 50 Mg Tabs (Trazodone Hcl) .Marland Kitchen.. 1 By Mouth At Bedtime 9)  Alprazolam 0.5 Mg Tabs (Alprazolam) .Marland Kitchen.. 1 Tab By Mouth Two Times A Day As Needed  Anxiety 10)  Omeprazole 40 Mg Cpdr (Omeprazole) .Marland Kitchen.. 1 Tab By Mouth Daily, Take 30 Min Before Breakfast  Allergies (verified): 1)  ! Morphine  Past History:  Past Medical History: Reviewed history from 06/02/2009 and no changes required. MVP depression chronic LBP, lumbar DDD endometriosis. [id number] migraines hematuria ischemic colitis hyperlipidemia  hx of SCC and BCC  gyn: Dr Ruby Cola  Past Surgical History: Reviewed history from 03/30/2008 and no changes required. TAH with bilat oophorectomy in 1991 for endometriosis back surgery 1993 laparasocpic laser sugery 1992, 1994 lapartomy 1995 spinal fusion 1998 T&A appendectomy R breast biopsy - benign  Family History: Reviewed history from 02/17/2008 and no changes required. mother died Alzheimers in her 60s father died ACS?, in his 70's sister Ataxia of unknown cause  Social History: Reviewed history from 02/17/2008 and no changes required. 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 Has 2 kids - son in [place] with 2 kids and daughter in [place] with 1 child. Quit smoking 1982 Denies ETOH. Good diet.   Plants to start going to the [facility].  Review of Systems      See  HPI  Physical Exam  General:  alert, well-developed, well-nourished, and well-hydrated.   Head:  normocephalic and atraumatic.   Eyes:  pupils equal, pupils round, and pupils reactive to light.   Ears:  no external deformities.   Nose:  no nasal discharge.   Mouth:  pharynx pink and moist.   Neck:  no masses.   Lungs:  Normal respiratory effort, chest expands symmetrically. Lungs are clear to auscultation, no crackles or wheezes. Heart:  normal rate, regular rhythm, no murmur, and systolic click.   Abdomen:  Bowel sounds positive,abdomen soft and non-tender without masses, organomegaly; moderate stool in the colon Msk:  full R hip ROM ith tenderness in full ext rotation Extremities:  no LE edema Skin:  color normal.    Cervical Nodes:  No lymphadenopathy noted Psych:  good eye contact, not anxious appearing, and not depressed appearing.     Impression & Recommendations:  Problem # 1:  DEPRESSION, CHRONIC (ICD-311) Continue current meds.  Mood is stable. The following medications were removed from the medication list:    Hydroxyzine Hcl 50 Mg Tabs (Hydroxyzine hcl) .Marland Kitchen... 1 tab by mouth q 6 hrs as needed anxiety Her updated medication list for this problem includes:    Venlafaxine Hcl 75 Mg Tabs (Venlafaxine hcl) .Marland Kitchen... 1 tab by mouth bid    Trazodone Hcl 50 Mg Tabs (Trazodone hcl) .Marland Kitchen... 1 by mouth at bedtime    Alprazolam 0.5 Mg Tabs (Alprazolam) .Marland Kitchen... 1 tab by mouth two times a day as needed anxiety  Problem # 2:  IRRITABLE BOWEL SYNDROME (ICD-564.1) Wil review her GI notes and recommend a bowel regimen with high fiber diet, colace and miralax.  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  She has reproducible hip tenderness on full ext rotation but declined Xray today. She is already on Pain meds. Her updated medication list for this problem includes:    Hydrocodone-acetaminophen 10-500 Mg Tabs (Hydrocodone-acetaminophen) .Marland Kitchen... Take 1 tablet by mouth three times a day  Complete Medication List: 1)  Venlafaxine Hcl 75 Mg Tabs (Venlafaxine hcl) .Marland Kitchen.. 1 tab by mouth bid 2)  Hydrocodone-acetaminophen 10-500 Mg Tabs (Hydrocodone-acetaminophen) .... Take 1 tablet by mouth three times a day 3)  Soma 350 Mg Tabs (Carisoprodol) .... Take 1 tablet by mouth four times a day 4)  Caltrate 600+d Plus 600-400 Mg-unit Tabs (Calcium carbonate-vit d-min) .... Take two by mouth two times a day 5)  Fish Oil 1200 Mg Caps (Omega-3 fatty acids) .... Take 1 tablet by mouth once a day 6)  Glucosamine Relief 500 Mg Caps (Glucosamine sulfate) .... Take 1 tablet by mouth two times a day 7)  Co Q-10 100 Mg Caps (Coenzyme q10)  .... Take 1 tablet by mouth once a day 8)  Trazodone Hcl 50 Mg Tabs (Trazodone hcl) .Marland Kitchen.. 1 by mouth at bedtime 9)  Alprazolam 0.5 Mg Tabs (Alprazolam) .Marland Kitchen.. 1 tab by mouth two times a day as needed anxiety 10)  Omeprazole 20 Mg Cpdr (Omeprazole) .Marland Kitchen.. 1 tab by mouth daily  Patient Instructions: 1)  Try Tylenol arthritis for hip/ groin pain -- likely DJD. 2)  Use OTC stool softener as needed for constipation. 3)  Try NAKED BRAND GREEN GODDESS -  fruit/ veggie smoothie for extra fiber. 4)  Use Miralax daily. 5)  Will get your records from GI. 6)  Meds RFd. 7)  Return for f/u in 4 mos. Prescriptions: OMEPRAZOLE 20 MG CPDR (OMEPRAZOLE) 1 tab by mouth daily  #90 x 1   Entered and Authorized by:   Seymour Bars DO   Signed by:   Seymour Bars DO on 02/14/2010   Method used:   Printed then faxed to ...       [facility]             , [place]         Ph:        Fax: [phone]   RxID:   [id number] ALPRAZOLAM 0.5 MG TABS (ALPRAZOLAM) 1 tab by mouth two times a day as needed anxiety  #90 x 0   Entered and Authorized by:   Seymour Bars DO   Signed by:   Seymour Bars DO on 02/14/2010   Method used:   Printed then faxed to ...       [facility]             , [place]         Ph:        Fax: [phone]   RxID:   [id number] VENLAFAXINE HCL 75 MG TABS (VENLAFAXINE HCL) 1 tab by mouth bid  #180 x 2   Entered and Authorized by:   Seymour Bars DO   Signed by:   Seymour Bars DO on 02/14/2010   Method used:   Printed then faxed to ...       [facility]             , [place]         Ph:        Fax: [phone]   RxID:   [id number]

## 2010-11-22 NOTE — Progress Notes (Signed)
Summary: Increase effexor  Phone Note Call from Patient   Caller: Patient Summary of Call: Pt decided she would like to increase Effexor to see if this will help more w/ her mood.  Initial call taken by: Payton Spark CMA,  June 06, 2010 9:34 AM    New/Updated Medications: VENLAFAXINE HCL 100 MG TABS (VENLAFAXINE HCL) 1 tab by mouth bid Prescriptions: VENLAFAXINE HCL 100 MG TABS (VENLAFAXINE HCL) 1 tab by mouth bid  #60 x 3   Entered and Authorized by:   Seymour Bars DO   Signed by:   Seymour Bars DO on 06/06/2010   Method used:   Electronically to        [facility] [phone]* (retail)       [address] [place], [place]  [address]       Ph: [phone]       Fax: [phone]   RxID:   [id number]   Appended Document: Increase effexor Pt aware of the above

## 2010-11-23 ENCOUNTER — Encounter: Payer: Self-pay | Admitting: Family Medicine

## 2010-11-24 NOTE — Progress Notes (Signed)
Summary: xanax refill  Phone Note Refill Request Call back at [phone] Message from:  Patient on October 25, 2010 1:24 PM  Refills Requested: Medication #1:  ALPRAZOLAM 0.5 MG TABS 1 tab by mouth two times a day as needed anxiety   Supply Requested: 1 month Fax to [facility] [address] in [place]   Method Requested: Electronic Initial call taken by: [name] LPN,  October 25, 2010 1:24 PM    Prescriptions: ALPRAZOLAM 0.5 MG TABS (ALPRAZOLAM) 1 tab by mouth two times a day as needed anxiety  #60 x 0   Entered and Authorized by:   Seymour Bars DO   Signed by:   Seymour Bars DO on 10/25/2010   Method used:   Printed then faxed to ...       [facility] [phone]* (retail)       [address] [place], [place]  [address]       Ph: [phone]       Fax: [phone]   RxID:   [id number]   Appended Document: xanax refill faxed

## 2010-11-24 NOTE — Letter (Signed)
Summary: [facility]   Imported By: Lanelle Bal 11/17/2010 11:33:53  _____________________________________________________________________  External Attachment:    Type:   Image     Comment:   External Document

## 2010-11-24 NOTE — Letter (Signed)
Summary: [facility]  [facility]   Imported By: Lanelle Bal 11/02/2010 09:30:58  _____________________________________________________________________  External Attachment:    Type:   Image     Comment:   External Document

## 2010-11-24 NOTE — Assessment & Plan Note (Signed)
Summary: CPE   Vital Signs:  Patient profile:   70 year old female Height:      66.5 inches Weight:      148 pounds BMI:     23.62 O2 Sat:      99 % on Room air Pulse rate:   90 / minute BP sitting:   114 / 73  (left arm) Cuff size:   regular  Vitals Entered By: Payton Spark CMA (November 03, 2010 10:21 AM)  O2 Flow:  Room air CC: CPE. Fasting labs.   Primary Care Provider:  Seymour Bars DO  CC:  CPE. Fasting labs..  History of Present Illness: 70 yo WF presents for CPE.    She is a recent widower with a supportive family.  She sees gyn for gyn care and mammograms and DEXA scans and reports that everything is UTD.  She is due for fasting labs.  She had a normal colonoscpy in 2006.    She had her pneumovax at 61 and her Tdap is UTD.  She is doing well emotionally and seeing her pain clinic on a regular basis.    Denies fam hx of premature heart dz,.  Eats healthy but is not exercising.  Denies CP or DOE.  Quit smoking back in the 1980s.    Current Medications (verified): 1)  Venlafaxine Hcl 100 Mg Tabs (Venlafaxine Hcl) .... 1.5 Tab By Mouth Two Times A Day 2)  Hydrocodone-Acetaminophen 10-500 Mg  Tabs (Hydrocodone-Acetaminophen) .... Take 1 Tablet By Mouth Three Times A Day 3)  Caltrate 600+d Plus 600-400 Mg-Unit  Tabs (Calcium Carbonate-Vit D-Min) .... Take Two By Mouth Two Times A Day 4)  Fish Oil 1200 Mg  Caps (Omega-3 Fatty Acids) .... Take 1 Tablet By Mouth Once A Day 5)  Glucosamine Relief 500 Mg  Caps (Glucosamine Sulfate) .... Take 1 Tablet By Mouth Two Times A Day 6)  Seroquel Xr 50 Mg Xr24h-Tab (Quetiapine Fumarate) .Marland Kitchen.. 1 Tab By Mouth Q Pm 7)  Alprazolam 0.5 Mg Tabs (Alprazolam) .Marland Kitchen.. 1 Tab By Mouth Two Times A Day As Needed Anxiety 8)  Omeprazole 20 Mg Cpdr (Omeprazole) .Marland Kitchen.. 1 Tab By Mouth Daily  Allergies (verified): 1)  ! Morphine  Past History:  Past Medical History: Reviewed history from 06/02/2009 and no changes required. MVP depression chronic  LBP, lumbar DDD endometriosis. [id number] migraines hematuria ischemic colitis hyperlipidemia  hx of SCC and BCC  gyn: Dr Ruby Cola  Family History: Reviewed history from 02/17/2008 and no changes required. mother died Alzheimers in her 48s father died ACS?, in his 22's sister Ataxia of unknown cause  Social History: Reviewed history from 06/03/2010 and no changes required. 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 Has 2 kids - son in [place] with 2 kids and daughter in [place] with 1 child. Quit smoking 1982 Denies ETOH. Good diet.   Plants to start going to the [facility].  Review of Systems  The patient denies anorexia, fever, weight loss, weight gain, vision loss, decreased hearing, hoarseness, chest pain, syncope, dyspnea on exertion, peripheral edema, prolonged cough, headaches, hemoptysis, abdominal pain, melena, hematochezia, severe indigestion/heartburn, hematuria, incontinence, genital sores, muscle weakness, suspicious skin lesions, transient blindness, difficulty walking, depression, unusual weight change, abnormal bleeding, enlarged lymph nodes, angioedema, breast masses, and testicular masses.    Physical Exam  General:  alert, well-developed, well-nourished, and well-hydrated.   Head:  normocephalic and atraumatic.   Eyes:  pupils equal, pupils round, and pupils reactive to light.  Ears:  EACs patent; TMs translucent and gray with good cone of light and bony landmarks.  Nose:  no nasal discharge.   Mouth:  pharynx pink and moist and fair dentition.   Neck:  no masses.  no audible carotid bruits Lungs:  Normal respiratory effort, chest expands symmetrically. Lungs are clear to auscultation, no crackles or wheezes. Heart:  normal rate, regular rhythm, no murmur, and systolic click.   Abdomen:  soft, non-tender, normal bowel sounds, no distention, no masses, no guarding, no hepatomegaly, and no splenomegaly.  no AA bruits Pulses:  2+ radial and  pedal pulses Extremities:  no LE edema Neurologic:  gait normal.   Skin:  color normal.   Cervical Nodes:  No lymphadenopathy noted Psych:  good eye contact, not anxious appearing, and not depressed appearing.     Impression & Recommendations:  Problem # 1:  HEALTH MAINTENANCE EXAM (ICD-V70.0) Keeping healthy checkllist for women reviewed. BP and BMI at goal. Wlork on regular exercise , healthy diet.  MVI + Calcium + D daily. Mood stable but due for fasting glucose/ lipids with new start Seroquel. Immunizations UTD. Paps, mammograms/ DEXA per Gyn - UTD. Annual eye and skin exams.    Complete Medication List: 1)  Venlafaxine Hcl 150 Mg Xr24h-cap (Venlafaxine hcl) .Marland Kitchen.. 1 capsule by mouth once daily 2)  Hydrocodone-acetaminophen 10-500 Mg Tabs (Hydrocodone-acetaminophen) .... Take 1 tablet by mouth three times a day 3)  Caltrate 600+d Plus 600-400 Mg-unit Tabs (Calcium carbonate-vit d-min) .... Take two by mouth two times a day 4)  Fish Oil 1200 Mg Caps (Omega-3 fatty acids) .... Take 1 tablet by mouth once a day 5)  Glucosamine Relief 500 Mg Caps (Glucosamine sulfate) .... Take 1 tablet by mouth two times a day 6)  Seroquel Xr 50 Mg Xr24h-tab (Quetiapine fumarate) .Marland Kitchen.. 1 tab by mouth q pm 7)  Alprazolam 0.5 Mg Tabs (Alprazolam) .Marland Kitchen.. 1 tab by mouth two times a day as needed anxiety 8)  Omeprazole 20 Mg Cpdr (Omeprazole) .Marland Kitchen.. 1 tab by mouth daily  Other Orders: T-Glucose, Blood ([id number]) T-Lipid Profile ([id number])  Patient Instructions: 1)  Lab downstairs today. 2)  Will call you w/ result tomorrow. 3)  Keep up the good work, just work on adding regular exercise (shoot for 30-45 min 4 days/ wk) and healthy diet - lean proteins, calcium, fruits and veggies. 4)  Return for f/u in 4 mos. Prescriptions: OMEPRAZOLE 20 MG CPDR (OMEPRAZOLE) 1 tab by mouth daily  #90 x 1   Entered and Authorized by:   Seymour Bars DO   Signed by:   Seymour Bars DO on 11/03/2010   Method used:    Electronically to        [facility] [phone]* (retail)       [address] [place], [place]  [address]       Ph: [phone]       Fax: [phone]   RxID:   [id number] SEROQUEL XR 50 MG XR24H-TAB (QUETIAPINE FUMARATE) 1 tab by mouth q PM  #30 x 3   Entered and Authorized by:   Seymour Bars DO   Signed by:   Seymour Bars DO on 11/03/2010   Method used:   Electronically to        [facility] [phone]* (retail)       [address] [place], [place]  [address]  Ph: [phone]       Fax: [phone]   RxID:   [id number] VENLAFAXINE HCL 150 MG XR24H-CAP (VENLAFAXINE HCL) 1 capsule by mouth once daily  #90 x 1   Entered and Authorized by:   Seymour Bars DO   Signed by:   Seymour Bars DO on 11/03/2010   Method used:   Electronically to        [facility] [phone]* (retail)       [address] [place], [place]  [address]       Ph: [phone]       Fax: [phone]   RxID:   [id number]    Orders Added: 1)  T-Glucose, Blood [[id number]] 2)  T-Lipid Profile [[id number]] 3)  Est. Patient age 65&> [phone]

## 2010-11-30 NOTE — Miscellaneous (Signed)
Summary: GI records  Clinical Lists Changes  Observations: Added new observation of PAST SURG HX: TAH with bilat oophorectomy in 1991 for endometriosis back surgery 1993 laparasocpic laser sugery 1992, 1994 lapartomy 1995 spinal fusion 1998 T&A appendectomy R breast biopsy - benign EGD 06 colonoscopy 06 (11/23/2010 10:02) Added new observation of PAST MED HX: MVP depression chronic LBP, lumbar DDD endometriosis. [id number] migraines hematuria ischemic colitis hyperlipidemia IBS -- Dr Marcelene Butte - on Miralax and Citrucel daily hx of SCC and BCC  gyn: Dr Ruby Cola (11/23/2010 10:02) Added new observation of PRIMARY MD: Seymour Bars DO (11/23/2010 10:02)       Past History:  Past Medical History: MVP depression chronic LBP, lumbar DDD endometriosis. [id number] migraines hematuria ischemic colitis hyperlipidemia IBS -- Dr Marcelene Butte - on Miralax and Citrucel daily hx of SCC and BCC  gyn: Dr Ruby Cola  Past Surgical History: TAH with bilat oophorectomy in 1991 for endometriosis back surgery 1993 laparasocpic laser sugery 1992, 1994 lapartomy 1995 spinal fusion 1998 T&A appendectomy R breast biopsy - benign EGD 06 colonoscopy 06

## 2010-12-01 ENCOUNTER — Telehealth: Payer: Self-pay | Admitting: Family Medicine

## 2010-12-08 NOTE — Progress Notes (Signed)
Summary: [facility]-Refill Alprazolam  Phone Note Refill Request Message from:  Patient  Refills Requested: Medication #1:  ALPRAZOLAM 0.5 MG TABS 1 tab by mouth two times a day as needed anxiety   Dosage confirmed as above?Dosage Confirmed   Supply Requested: 1 month  Method Requested: Fax to Local Pharmacy Initial call taken by: Francee Piccolo CMA Duncan Dull),  December 01, 2010 11:50 AM    Prescriptions: ALPRAZOLAM 0.5 MG TABS (ALPRAZOLAM) 1 tab by mouth two times a day as needed anxiety  #60 x 1   Entered and Authorized by:   Seymour Bars DO   Signed by:   Seymour Bars DO on 12/01/2010   Method used:   Printed then faxed to ...       [facility] [phone]* (retail)       [address] [place], [place]  [address]       Ph: [phone]       Fax: [phone]   RxID:   [id number]

## 2011-01-30 ENCOUNTER — Other Ambulatory Visit: Payer: Self-pay | Admitting: *Deleted

## 2011-01-30 MED ORDER — ALPRAZOLAM 0.5 MG PO TABS: 0.5000 mg | ORAL_TABLET | Freq: Two times a day (BID) | ORAL | Status: DC | PRN

## 2011-02-20 ENCOUNTER — Encounter: Payer: Self-pay | Admitting: Family Medicine

## 2011-02-21 ENCOUNTER — Ambulatory Visit (INDEPENDENT_AMBULATORY_CARE_PROVIDER_SITE_OTHER): Payer: Medicare Other | Admitting: Family Medicine

## 2011-02-21 ENCOUNTER — Encounter: Payer: Self-pay | Admitting: Family Medicine

## 2011-02-21 VITALS — BP 124/80 | HR 85 | Ht 66.5 in | Wt 151.0 lb

## 2011-02-21 DIAGNOSIS — F3289 Other specified depressive episodes: Secondary | ICD-10-CM

## 2011-02-21 DIAGNOSIS — F329 Major depressive disorder, single episode, unspecified: Secondary | ICD-10-CM

## 2011-02-21 MED ORDER — ALPRAZOLAM 0.5 MG PO TABS: 0.5000 mg | ORAL_TABLET | Freq: Three times a day (TID) | ORAL | Status: DC | PRN

## 2011-02-21 NOTE — Patient Instructions (Signed)
Stay on current meds.  Return for follow up in 4 mos.

## 2011-02-21 NOTE — Progress Notes (Signed)
  Subjective:    Patient ID: Carolyn Hendrix, female    DOB: 09-18-1941, 70 y.o.   MRN: [id number]  HPI  70 yo WF presents for f/u mood.  She is emotional about selling her house.  She is going to stay in the area since her son Onalee Hua lives here.  She decided to not move closer to her daughter.  She is happy w/ her decision.  She lost her husband last yr.  She stopped doing counseling.  We changed her Effexor to 150 mg XR once daily.  BP 124/80  Pulse 85  Ht 5' 6.5" (1.689 m)  Wt 151 lb (68.493 kg)  BMI 24.01 kg/m2  SpO2 98%        Review of Systems  Psychiatric/Behavioral: Negative for suicidal ideas, sleep disturbance, self-injury, dysphoric mood, decreased concentration and agitation. The patient is nervous/anxious.        Objective:   Physical Exam  Constitutional: She appears well-developed and well-nourished.  Cardiovascular: Normal rate, regular rhythm and normal heart sounds.   Pulmonary/Chest: Effort normal and breath sounds normal. No respiratory distress. She has no wheezes.  Psychiatric: She has a normal mood and affect. Judgment and thought content normal.          Assessment & Plan:

## 2011-02-21 NOTE — Assessment & Plan Note (Signed)
Still going thru the normal greiving process but I do see progress in how she is handling stressors.  Going thru some family issues but overall, she has a very supportive family and her counselor told her she didn't need to go back.  We discussed how she was handling issues, discussed stress reducition, exercise.  PHQ 9 score 10.  Continue current meds. RTC 4 mos.

## 2011-03-08 ENCOUNTER — Other Ambulatory Visit: Payer: Self-pay | Admitting: Family Medicine

## 2011-03-08 DIAGNOSIS — M7989 Other specified soft tissue disorders: Secondary | ICD-10-CM

## 2011-03-08 DIAGNOSIS — R413 Other amnesia: Secondary | ICD-10-CM

## 2011-03-08 NOTE — Telephone Encounter (Signed)
Received call from Dr Myrtis Ser, her chiropractor that pt had a panoramic xray at her dental office and she had calcifications near her c spine.  Had c spine xrays done and it looks like she might had calcifications in her arteries and recommend carotid and vertebral artery dopplers.  Will order.

## 2011-03-14 ENCOUNTER — Encounter: Payer: Self-pay | Admitting: Family Medicine

## 2011-03-14 ENCOUNTER — Telehealth: Payer: Self-pay | Admitting: Family Medicine

## 2011-03-14 NOTE — Telephone Encounter (Signed)
Pls let pt know that her carotid doppler u/s came back normal.  No sign of blokckages.

## 2011-03-14 NOTE — Telephone Encounter (Signed)
Pt called and wants u/s carotid artery results.  I see the order, but don't see the result.  Please advise. Plan:  Routed to Dr. Arlice Colt, LPN [facility]

## 2011-03-15 ENCOUNTER — Telehealth: Payer: Self-pay | Admitting: Family Medicine

## 2011-03-15 NOTE — Telephone Encounter (Signed)
03-16-11 Pt notified of normal carotid u/s results.  No signs of blockages.   Jarvis Newcomer, LPN [facility]

## 2011-03-15 NOTE — Telephone Encounter (Signed)
Pls see my note below -- her carotid u/s came back normal.  No sign of blockages.

## 2011-03-23 NOTE — Telephone Encounter (Signed)
Jarvis Newcomer, LPN [facility]

## 2011-04-12 ENCOUNTER — Other Ambulatory Visit: Payer: Self-pay | Admitting: Family Medicine

## 2011-04-25 ENCOUNTER — Other Ambulatory Visit: Payer: Self-pay | Admitting: Family Medicine

## 2011-04-27 ENCOUNTER — Other Ambulatory Visit: Payer: Self-pay | Admitting: Family Medicine

## 2011-05-22 ENCOUNTER — Telehealth: Payer: Self-pay | Admitting: Family Medicine

## 2011-05-22 NOTE — Telephone Encounter (Signed)
Patient has called twice request to speak to you about her meds. Request to know if you can call her back on her cell phone today [phone]

## 2011-05-30 ENCOUNTER — Ambulatory Visit (INDEPENDENT_AMBULATORY_CARE_PROVIDER_SITE_OTHER): Payer: Medicare Other | Admitting: Family Medicine

## 2011-05-30 ENCOUNTER — Encounter: Payer: Self-pay | Admitting: Family Medicine

## 2011-05-30 ENCOUNTER — Telehealth: Payer: Self-pay | Admitting: Family Medicine

## 2011-05-30 VITALS — BP 143/83 | HR 80 | Ht 66.0 in | Wt 144.0 lb

## 2011-05-30 DIAGNOSIS — F3289 Other specified depressive episodes: Secondary | ICD-10-CM

## 2011-05-30 DIAGNOSIS — Z23 Encounter for immunization: Secondary | ICD-10-CM

## 2011-05-30 DIAGNOSIS — F329 Major depressive disorder, single episode, unspecified: Secondary | ICD-10-CM

## 2011-05-30 MED ORDER — QUETIAPINE FUMARATE ER 50 MG PO TB24: 50.0000 mg | ORAL_TABLET | Freq: Every day | ORAL | Status: DC

## 2011-05-30 MED ORDER — ZOSTER VACCINE LIVE 19400 UNT/0.65ML ~~LOC~~ SOLR: 0.6500 mL | Freq: Once | SUBCUTANEOUS | Status: DC

## 2011-05-30 MED ORDER — ALPRAZOLAM 0.5 MG PO TABS: 0.5000 mg | ORAL_TABLET | Freq: Three times a day (TID) | ORAL | Status: DC | PRN

## 2011-05-30 NOTE — Progress Notes (Signed)
  Subjective:    Patient ID: Carolyn Hendrix, female    DOB: 1941/09/01, 70 y.o.   MRN: [id number]  HPI69 yo WF presents for f/u mood.  Doing well on Seroquel and is almost ready to come off.  She just moved about 3 wks ago though and it has stirred up more emotions since her husband died last year.  She is crying more often.  She is staying busy and has a lot of family support.  Doing well otherwise.  BP 143/83  Pulse 80  Ht 5\' 6"  (1.676 m)  Wt 144 lb (65.318 kg)  BMI 23.24 kg/m2  SpO2 94%   Review of Systems  Psychiatric/Behavioral: Negative for sleep disturbance, self-injury and dysphoric mood. The patient is not nervous/anxious.        Objective:   Physical Exam  Constitutional: She appears well-developed and well-nourished.  Cardiovascular: Normal rate and normal heart sounds.   Pulmonary/Chest: Effort normal and breath sounds normal.  Psychiatric: She has a normal mood and affect.       At times, tearful          Assessment & Plan:

## 2011-05-30 NOTE — Telephone Encounter (Signed)
RX printed and faxed this AM.  pls call pharmacy to double check.

## 2011-05-30 NOTE — Assessment & Plan Note (Signed)
Doing well overall.  As expected, moving caused her to be more emotional but she has made it through and is almost done unpacking.  She is a bit tearful but also smiling today.  I recommend that she stay on Seroquel for 6 more wks then try to come off.  She is already on the lowest XR dose.  If mood worsens, can call for more RFs than given today.  Has a good support system.

## 2011-05-30 NOTE — Telephone Encounter (Signed)
Patient called adv that she went to the [facility] pharmacy on [address] and they do not have her zanex prescription

## 2011-05-30 NOTE — Patient Instructions (Signed)
Stay on Seroquel for the next 6-8 wks then try to come off. Call if any problems.  Return for f/u mood/ fasting labs/ flu shot in 3 mos.

## 2011-05-31 ENCOUNTER — Telehealth: Payer: Self-pay | Admitting: Family Medicine

## 2011-05-31 NOTE — Telephone Encounter (Signed)
Closed

## 2011-05-31 NOTE — Telephone Encounter (Signed)
Called the pt pharm to see if they had received the xanex prescription.  They had rec yesterday.  Then, contacted the pt to let her know. Jarvis Newcomer, LPN [facility]

## 2011-06-28 ENCOUNTER — Other Ambulatory Visit: Payer: Self-pay | Admitting: Physical Medicine and Rehabilitation

## 2011-06-28 ENCOUNTER — Ambulatory Visit
Admission: RE | Admit: 2011-06-28 | Discharge: 2011-06-28 | Disposition: A | Discharge status: Home / Self Care | Payer: Medicare Other | Source: Ambulatory Visit | Attending: Physical Medicine and Rehabilitation | Admitting: Physical Medicine and Rehabilitation

## 2011-06-28 DIAGNOSIS — M545 Low back pain, unspecified: Secondary | ICD-10-CM

## 2011-06-28 DIAGNOSIS — M961 Postlaminectomy syndrome, not elsewhere classified: Secondary | ICD-10-CM

## 2011-07-25 ENCOUNTER — Other Ambulatory Visit: Payer: Self-pay | Admitting: *Deleted

## 2011-07-25 MED ORDER — OMEPRAZOLE 20 MG PO CPDR: 20.0000 mg | DELAYED_RELEASE_CAPSULE | Freq: Every day | ORAL | Status: DC

## 2011-08-21 ENCOUNTER — Other Ambulatory Visit: Payer: Self-pay | Admitting: Family Medicine

## 2011-08-21 MED ORDER — QUETIAPINE FUMARATE ER 50 MG PO TB24: 50.0000 mg | ORAL_TABLET | Freq: Every day | ORAL | Status: DC

## 2011-08-21 NOTE — Telephone Encounter (Signed)
[facility] calling for refill for seroquel XR 50 mg. Plan:  Refilled # 30/2 refills.  [facility]. Jarvis Newcomer, LPN [facility]

## 2011-08-30 ENCOUNTER — Encounter: Payer: Self-pay | Admitting: Family Medicine

## 2011-08-30 ENCOUNTER — Ambulatory Visit (INDEPENDENT_AMBULATORY_CARE_PROVIDER_SITE_OTHER): Payer: Medicare Other | Admitting: Family Medicine

## 2011-08-30 VITALS — BP 112/62 | HR 81 | Wt 147.0 lb

## 2011-08-30 DIAGNOSIS — F329 Major depressive disorder, single episode, unspecified: Secondary | ICD-10-CM

## 2011-08-30 DIAGNOSIS — F32A Depression, unspecified: Secondary | ICD-10-CM

## 2011-08-30 DIAGNOSIS — F3289 Other specified depressive episodes: Secondary | ICD-10-CM

## 2011-08-30 DIAGNOSIS — E789 Disorder of lipoprotein metabolism, unspecified: Secondary | ICD-10-CM

## 2011-08-30 DIAGNOSIS — Z23 Encounter for immunization: Secondary | ICD-10-CM

## 2011-08-30 MED ORDER — OMEPRAZOLE 20 MG PO CPDR: 20.0000 mg | DELAYED_RELEASE_CAPSULE | Freq: Every day | ORAL | Status: DC

## 2011-08-30 MED ORDER — VENLAFAXINE HCL ER 150 MG PO CP24: 150.0000 mg | ORAL_CAPSULE | Freq: Every day | ORAL | Status: DC

## 2011-08-30 MED ORDER — QUETIAPINE FUMARATE ER 50 MG PO TB24: 50.0000 mg | ORAL_TABLET | Freq: Every day | ORAL | Status: DC

## 2011-08-30 NOTE — Patient Instructions (Signed)
We will call you with your lab results. If you don't here from us in about a week then please give us a call at [phone].  

## 2011-08-30 NOTE — Progress Notes (Signed)
  Subjective:    Patient ID: Carolyn Hendrix, female    DOB: 1941-08-05, 70 y.o.   MRN: [id number]  HPI  Depression- She is so much better since being back on the seroquel.  Husband passed away last year and then moved.  Went off earlier this year and her mood really went down.  Wasn't sleeping well either. Both of these are much better.  Back on it for about a week.  Took a couple of days to start working. Same dose as had taken in the past.   Review of Systems     Objective:   Physical Exam  Constitutional: She is oriented to person, place, and time. She appears well-developed and well-nourished.  HENT:  Head: Normocephalic and atraumatic.  Cardiovascular: Normal rate, regular rhythm and normal heart sounds.   Pulmonary/Chest: Effort normal and breath sounds normal.  Neurological: She is alert and oriented to person, place, and time.  Skin: Skin is warm and dry.  Psychiatric: She has a normal mood and affect. Her behavior is normal.          Assessment & Plan:  Depression - doing really well.  PHQ- 9 is 6. I think when she is back on the medication for a couple more weeks I suspect her PHQ 9 will go back into the normal range. It sounds like firstly while he is already improving. Followup in 4 months. She can call sooner she has any problems. At that point time she might be a better place and we can try weaning off the Seroquel again.   Per previous notes by Dr. Cathey Endow and she wanted her to have her cholesterol and CMP rechecked today. She was given a lab slip in finger which has been fasting for 8 hours. We will call her with the results. Her LDL was elevated at the last visit.  Given flu vaccine today.

## 2011-08-31 LAB — COMPLETE METABOLIC PANEL WITH GFR
CO2: 34 mEq/L — ABNORMAL HIGH (ref 19–32)
Creat: 0.85 mg/dL (ref 0.50–1.10)
GFR, Est African American: 81 mL/min — ABNORMAL LOW (ref >89)
GFR, Est Non African American: 71 mL/min — ABNORMAL LOW (ref >89)
Glucose, Bld: 81 mg/dL (ref 70–99)
Total Bilirubin: 0.5 mg/dL (ref 0.3–1.2)

## 2011-08-31 LAB — LIPID PANEL
Cholesterol: 225 mg/dL — ABNORMAL HIGH (ref 0–200)
HDL: 49 mg/dL (ref >39)
Triglycerides: 145 mg/dL (ref <150)

## 2011-09-28 ENCOUNTER — Encounter: Payer: Self-pay | Admitting: Family Medicine

## 2011-10-18 ENCOUNTER — Other Ambulatory Visit: Payer: Self-pay | Admitting: Family Medicine

## 2011-10-18 ENCOUNTER — Other Ambulatory Visit: Payer: Self-pay | Admitting: *Deleted

## 2011-10-18 MED ORDER — OMEPRAZOLE 20 MG PO CPDR: 20.0000 mg | DELAYED_RELEASE_CAPSULE | Freq: Every day | ORAL | Status: DC

## 2011-11-08 ENCOUNTER — Other Ambulatory Visit: Payer: Self-pay | Admitting: *Deleted

## 2011-11-08 MED ORDER — OMEPRAZOLE 20 MG PO CPDR: 20.0000 mg | DELAYED_RELEASE_CAPSULE | Freq: Every day | ORAL | Status: DC

## 2011-12-13 ENCOUNTER — Telehealth: Payer: Self-pay | Admitting: Family Medicine

## 2011-12-13 DIAGNOSIS — Z780 Asymptomatic menopausal state: Secondary | ICD-10-CM

## 2011-12-13 NOTE — Telephone Encounter (Signed)
Call pt: Due for DEXA. Order placed they will contact her.

## 2011-12-14 NOTE — Telephone Encounter (Signed)
Pt notified. Pt gets bone densities with GYN. She will send [place] a copy when she gets her next one

## 2011-12-28 ENCOUNTER — Encounter: Payer: Medicare Other | Admitting: Family Medicine

## 2011-12-28 DIAGNOSIS — Z0289 Encounter for other administrative examinations: Secondary | ICD-10-CM

## 2011-12-29 NOTE — Progress Notes (Signed)
Error

## 2012-02-05 ENCOUNTER — Encounter: Payer: Self-pay | Admitting: Family Medicine

## 2012-02-05 ENCOUNTER — Ambulatory Visit (INDEPENDENT_AMBULATORY_CARE_PROVIDER_SITE_OTHER): Payer: Medicare Other | Admitting: Family Medicine

## 2012-02-05 VITALS — BP 123/72 | HR 87 | Ht 66.0 in | Wt 147.0 lb

## 2012-02-05 DIAGNOSIS — G5600 Carpal tunnel syndrome, unspecified upper limb: Secondary | ICD-10-CM

## 2012-02-05 DIAGNOSIS — M7710 Lateral epicondylitis, unspecified elbow: Secondary | ICD-10-CM

## 2012-02-05 DIAGNOSIS — F329 Major depressive disorder, single episode, unspecified: Secondary | ICD-10-CM

## 2012-02-05 DIAGNOSIS — F3289 Other specified depressive episodes: Secondary | ICD-10-CM

## 2012-02-05 DIAGNOSIS — G5602 Carpal tunnel syndrome, left upper limb: Secondary | ICD-10-CM

## 2012-02-05 DIAGNOSIS — F32A Depression, unspecified: Secondary | ICD-10-CM

## 2012-02-05 MED ORDER — ALPRAZOLAM 0.5 MG PO TABS: 0.5000 mg | ORAL_TABLET | Freq: Two times a day (BID) | ORAL | Status: DC | PRN

## 2012-02-05 MED ORDER — OMEPRAZOLE 20 MG PO CPDR: 20.0000 mg | DELAYED_RELEASE_CAPSULE | Freq: Every day | ORAL | Status: DC

## 2012-02-05 MED ORDER — VENLAFAXINE HCL ER 150 MG PO CP24: 150.0000 mg | ORAL_CAPSULE | Freq: Every day | ORAL | Status: DC

## 2012-02-05 NOTE — Progress Notes (Signed)
  Subjective:    Patient ID: Carolyn Hendrix, female    DOB: [birth year], 71 y.o.   MRN: [id number]  HPI Depression - doing well. She stopped her seroquel about a month ago but doing well. Says overall sleeping well.   Left elbow pain on and off for years.  Says has had 2 cortisone shots over the years.  Also has carpal tunnel in the hand as well.  Wlebo has been waking er up at night like a toothache.  Says she has been wearing the elbow strap and not really helping. Numbness in her left thrumb and first and 2nd finger.  She says even when she wears night splints her hands still fall asleep at night. Review of Systems     Objective:   Physical Exam  Constitutional: She is oriented to person, place, and time. She appears well-developed and well-nourished.  Musculoskeletal:       Left elbow and wrist with normal range of motion. She is very tender over the lateral epicondyle. There is no apparent swelling or redness or rash. She has no pain over the lateral epicondyle with pronation or supination against resistance.  Neurological: She is alert and oriented to person, place, and time.  Skin: Skin is warm and dry.  Psychiatric: She has a normal mood and affect. Her behavior is normal.          Assessment & Plan:  Depression-PHQ-9 score of 5. At this point he thinks is fairly well-controlled. She says she typically does better in the spring and summer when she gets out of the winter season anyway. We'll continue current regimen. Her meds Seroquel for her medication list. Follow up in 6 months.  Left lateral epicondylitis-she would like another injection. We discussed that a lot of her sxs may actually be coming from her carpal tunnel but I do think she has both.   Left carpal tunnel-based on the severity of her symptoms she really needs to see a surgeon for possible surgical correction. This has been ongoing for years. She is very at surgical correction of the right and had good results  except for some mild residual weakness.   Elbow Injection Procedure Note  Pre-operative Diagnosis: left lateral epicondylitis  Post-operative Diagnosis: same  Indications: Symptomatic relief  Anesthesia: Lidocaine 1% without epinephrine and 1 cc of 40mg  triamcinolone.  Procedure Details   Verbal consent was obtained for the procedure. The point of maximum tenderness was identified and marked. The skin prepped with Betadine and a small wheel of anesthetic was injected into the subcutaneous tissue. A needle was advanced into the left elbow joint to the lateral epicondyle.  Complications:  None; patient tolerated the procedure well.

## 2012-03-23 ENCOUNTER — Other Ambulatory Visit: Payer: Self-pay | Admitting: Family Medicine

## 2012-04-02 ENCOUNTER — Other Ambulatory Visit: Payer: Self-pay | Admitting: *Deleted

## 2012-04-02 MED ORDER — ALPRAZOLAM 0.5 MG PO TABS: 0.5000 mg | ORAL_TABLET | Freq: Two times a day (BID) | ORAL | Status: DC | PRN

## 2012-04-12 ENCOUNTER — Ambulatory Visit (INDEPENDENT_AMBULATORY_CARE_PROVIDER_SITE_OTHER): Payer: Medicare Other | Admitting: Family Medicine

## 2012-04-12 ENCOUNTER — Encounter: Payer: Self-pay | Admitting: Family Medicine

## 2012-04-12 VITALS — BP 116/70 | HR 81 | Wt 141.0 lb

## 2012-04-12 DIAGNOSIS — M7062 Trochanteric bursitis, left hip: Secondary | ICD-10-CM

## 2012-04-12 DIAGNOSIS — M76899 Other specified enthesopathies of unspecified lower limb, excluding foot: Secondary | ICD-10-CM

## 2012-04-12 DIAGNOSIS — M7060 Trochanteric bursitis, unspecified hip: Secondary | ICD-10-CM

## 2012-04-12 DIAGNOSIS — M7061 Trochanteric bursitis, right hip: Secondary | ICD-10-CM

## 2012-04-12 MED ORDER — METHYLPREDNISOLONE ACETATE 40 MG/ML IJ SUSP
80.0000 mg | Freq: Once | INTRAMUSCULAR | Status: AC
  Administered 2012-04-12: 80 mg via INTRA_ARTICULAR

## 2012-04-12 NOTE — Patient Instructions (Addendum)

## 2012-04-12 NOTE — Progress Notes (Signed)
  Subjective:    Patient ID: Carolyn Hendrix, female    DOB: 04-Jun-1941, 71 y.o.   MRN: [id number]  HPI Hx of back pain.  Says painful to lay on her right hip for years. Now her left aches like a tooth aches.  No radiation of pain.  Some pain radiates into her groin. Occ does heating pad.  Take chronic pain meds.  Doesn't help her sleep. No trauma.     Review of Systems     Objective:   Physical Exam  Constitutional: She appears well-developed and well-nourished.  Musculoskeletal: She exhibits no edema.       Low back is mildly tender to the right SI joint. Nontender over the lower lumbar spine. Surgical scar is well-healed. Hip, knee, ankle strength is 5 over 5 bilaterally. She has great range of motion of her right and left hip. There she's slightly decreased with external rotation on the left. Abductor Stregnth is slightly weak on the left compared to the right.  Very tender over bilateral greater trochanters.          Assessment & Plan:  Bilateral trochanteric busitis - discussed tx options including NSAID, exercises, icing, and steroids injection. She does have hx of chronic low back pain which could be contributing.   Subjective:     Carolyn Hendrix is a 71 y.o. female who presents for evaluation of bilateral hip pain. Patient has a several months history of pain, tenderness in the area of both hips. The pain is sharp at times and is on the outside of the hip. There is no radiation down the leg. The patient has had this type of problem before. The patient does not exercise regularly. There was no known injury to the affected area. Onset was gradual. The symptoms are moderate. Associated symptoms include: none. Patient denies bleeding/clotting problems and fevers. Aggravating factors: laying on side. Alleviating factors: nothing. Previous treatments include prescription meds - see below. Limitation on activities include difficulty with walking.  The following portions of the  patient's history were reviewed and updated as appropriate: allergies, current medications, past family history, past medical history, past social history, past surgical history and problem list.  Review of Systems Pertinent items are noted in HPI.    Objective:      Assessment:    Bilateral trochanteric bursitis  - the hips were cleaned with iodine and alcohol. Topical anesthetic was sprayed. Each hip was injected with 1 mL of 40 mg of Kenalog with 9 mL's of 1% lidocaine without epinephrine. She tolerated very well. No blood loss. Band-Aid placed over injection site.   Plan:    1.  I explained the condition to the patient. 2.  All questions answered.  3.  We discussed the option of using a steroid injection to settle the inflammation. 4.  I recommended regular icing for 20 min, three times per day.   5.  I offered the use of anti-inflammatories such as ibuprofen or Aleve to help with the pain.  6.  I gave the patient a handout on this condition and instructed them on the exercises.  Increasing flexibility should help take the tension off of the iliotibial band as it crosses the greater trochanter.  7.  Follow up in prn  and as needed.

## 2012-04-24 ENCOUNTER — Encounter: Payer: Self-pay | Admitting: Family Medicine

## 2012-04-24 ENCOUNTER — Ambulatory Visit (INDEPENDENT_AMBULATORY_CARE_PROVIDER_SITE_OTHER): Payer: Medicare Other | Admitting: Family Medicine

## 2012-04-24 VITALS — BP 117/74 | HR 87 | Ht 66.0 in | Wt 140.0 lb

## 2012-04-24 DIAGNOSIS — F329 Major depressive disorder, single episode, unspecified: Secondary | ICD-10-CM

## 2012-04-24 DIAGNOSIS — F32A Depression, unspecified: Secondary | ICD-10-CM

## 2012-04-24 DIAGNOSIS — F3289 Other specified depressive episodes: Secondary | ICD-10-CM

## 2012-04-24 DIAGNOSIS — K581 Irritable bowel syndrome with constipation: Secondary | ICD-10-CM

## 2012-04-24 DIAGNOSIS — K589 Irritable bowel syndrome without diarrhea: Secondary | ICD-10-CM

## 2012-04-24 MED ORDER — ESCITALOPRAM OXALATE 10 MG PO TABS: 10.0000 mg | ORAL_TABLET | Freq: Every day | ORAL | Status: DC

## 2012-04-24 MED ORDER — VENLAFAXINE HCL ER 37.5 MG PO CP24: 37.5000 mg | ORAL_CAPSULE | Freq: Every day | ORAL | Status: DC

## 2012-04-24 NOTE — Patient Instructions (Signed)
Start the effexor 37.5 (2 tabs daily for one wee).  Then decrease to 1 tab daily for one week Once down to one tab of the effexor daily, start the escitalopram (lexapro) once a day. (they will overlap for one week)

## 2012-04-24 NOTE — Progress Notes (Signed)
  Subjective:    Patient ID: Carolyn Hendrix, female    DOB: [birth year], 71 y.o.   MRN: [id number]  HPI Depression - Used to be on seroquel.  Cam off on her own in March.  Has been more tearful, and irritable.  Her grandhcildren have been annoying her and that is not like her. Says hasn't wanted ot get out of the house.  Feels less motivated.  She has a long history of depression her husband passed away 2 years ago and this has been a major trigger for her.  Constipation predominant IBS - has been worse the last couple of months.  Tried miralax, probiotics, etc. she is mostly complaining of a lot of spasm and discomfort. This is very typical for her IBS. She is currently using MiraLax to help her have more regular bowel movements. It does seem to help.  Was on soma in the past but stopped about a year ago and feel sher IBS has been worse since then   Review of Systems     Objective:   Physical Exam  Constitutional: She is oriented to person, place, and time. She appears well-developed and well-nourished.  HENT:  Head: Normocephalic and atraumatic.  Cardiovascular: Normal rate, regular rhythm and normal heart sounds.   Pulmonary/Chest: Effort normal and breath sounds normal.  Neurological: She is alert and oriented to person, place, and time.  Skin: Skin is warm and dry.  Psychiatric: She has a normal mood and affect. Her behavior is normal.          Assessment & Plan:  Depression - PHQ-9 score of 14 today.  Discussed options.  She says she has tried Paxil (felt too flat) in the past, cymbalta (diarrhea), sertraline, Prozac. Currently she is on Effexor at the maximum dose. We discussed changing her to Lexapro. I wrote down a taper for her. Followup in one month to see how she's doing. She has any problems at all in the interim please call her with an Allis contact the Effexor 150 mg and add back the Seroquel. If we can avoid the Seroquel for the time being I would prefer since it does  tend to cause weight gain and puts her at risk of developing diabetes. She does plan to go to the beach with her family in the next week I encouraged her to do so. 25 minutes spent face-to-face, with more than half the time spent in counseling on anxiety treatment and plan.     IBS with constipation-we discussed different options. She has never tried Amitiza or Linzess. She's currently using MiraLax and still having head and abdomen. She has seen GI multiple times over the years. I gave her samples of Amitiza 8 mcg twice a day to try for one week. If this works well and she tolerates it without any side effects and will send prescription to her pharmacy. It is not covered then we can consider changing her to Linzess. If her pain and spasms or not improving then encouraged her to see her GI again.

## 2012-05-13 ENCOUNTER — Emergency Department (INDEPENDENT_AMBULATORY_CARE_PROVIDER_SITE_OTHER)
Admission: EM | Admit: 2012-05-13 | Discharge: 2012-05-13 | Disposition: A | Discharge status: Home / Self Care | Payer: Medicare Other | Source: Home / Self Care | Attending: Family Medicine | Admitting: Family Medicine

## 2012-05-13 ENCOUNTER — Encounter: Payer: Self-pay | Admitting: *Deleted

## 2012-05-13 DIAGNOSIS — J029 Acute pharyngitis, unspecified: Secondary | ICD-10-CM

## 2012-05-13 DIAGNOSIS — J069 Acute upper respiratory infection, unspecified: Secondary | ICD-10-CM

## 2012-05-13 HISTORY — DX: Gastro-esophageal reflux disease without esophagitis: K21.9

## 2012-05-13 MED ORDER — BENZONATATE 200 MG PO CAPS: 200.0000 mg | ORAL_CAPSULE | Freq: Every day | ORAL | Status: AC

## 2012-05-13 NOTE — ED Notes (Signed)
Pt c/o sore throat, post nasal drip, HA, LT ear ache and white patches on the back of her throat x 3 days. Denies fever.

## 2012-05-13 NOTE — ED Provider Notes (Signed)
History     CSN: [id number]  Arrival date & time 05/13/12  1018   First MD Initiated Contact with Patient 05/13/12 1023      Chief Complaint  Patient presents with  . Sore Throat      HPI Comments: Patient complains of 3 day history of gradually progressive URI symptoms beginning with a mild sore throat (now improved), followed by progressive nasal congestion.  A cough started next. Complains of fatigue but no myalgias.  Cough is somewhat worse at night and generally non-productive during the day.  There has been no pleuritic pain, shortness of breath, or wheezes.  She has a history of perennial rhinitis and takes Zyrtec daily.  The history is provided by the patient.    Past Medical History  Diagnosis Date  . MVP (mitral valve prolapse)   . Depression   . Chronic LBP     Lumbar DDD  . Endometriosis   . Migraines   . Hematuria   . Colitis, ischemic   . Hyperlipidemia   . IBS (irritable bowel syndrome)     Fr Hurrelbrink- on Miralax and Citrucel daily  . GERD (gastroesophageal reflux disease)     Past Surgical History  Procedure Date  . Abdominal hysterectomy 1991    w/ bilat oophorectomy for endometriosis  . Back surgery 1993  . Laparascopic laser surgery 1992, 1994  . Lapartomy 1995  . Spinal fusion 1998  . Tonsillectomy and adenoidectomy   . Appendectomy   . Right breast biopsy- benign   . Esophagogastroduodenoscopy 06    Family History  Problem Relation Age of Onset  . Alzheimer's disease Mother   . Other Father     ACS? - 8's  . Ataxia Sister     History  Substance Use Topics  . Smoking status: Former Smoker    Quit date: 10/23/1980  . Smokeless tobacco: Not on file  . Alcohol Use: No    OB History    Grav Para Term Preterm Abortions TAB SAB Ect Mult Living                  Review of Systems + sore throat + cough No pleuritic pain No wheezing + nasal congestion + post-nasal drainage No sinus pain/pressure No itchy/red eyes No  earache No hemoptysis No SOB No fever/chills No nausea No vomiting No abdominal pain No diarrhea No urinary symptoms No skin rashes + fatigue No myalgias No headache Used OTC meds without relief  Allergies  Morphine  Home Medications   Current Outpatient Rx  Name Route Sig Dispense Refill  . ALPRAZOLAM 0.5 MG PO TABS Oral Take 1 tablet (0.5 mg total) by mouth 2 (two) times daily as needed for anxiety. 60 tablet 1  . BENZONATATE 200 MG PO CAPS Oral Take 1 capsule (200 mg total) by mouth at bedtime. Take as needed for cough 12 capsule 0  . CALCIUM CARBONATE-VITAMIN D 600-400 MG-UNIT PO TABS Oral Take 2 tablets by mouth 2 (two) times daily.      Marland Kitchen ESCITALOPRAM OXALATE 10 MG PO TABS Oral Take 1 tablet (10 mg total) by mouth daily. 30 tablet 0  . GLUCOSAMINE-CHONDROITIN 500-400 MG PO TABS Oral Take 1 tablet by mouth 2 (two) times daily.      Marland Kitchen HYDROCODONE-ACETAMINOPHEN 10-325 MG PO TABS Oral Take 1 tablet by mouth. Take one tablet by mouth five times a day    . FISH OIL 1200 MG PO CAPS Oral Take by mouth.      Marland Kitchen  OMEPRAZOLE 20 MG PO CPDR  TAKE ONE CAPSULE EVERY DAY 90 capsule 1  . TRAZODONE HCL 50 MG PO TABS Oral Take 50-100 mg by mouth at bedtime.    . VENLAFAXINE HCL ER 37.5 MG PO CP24 Oral Take 1 capsule (37.5 mg total) by mouth daily. 30 capsule 1    BP 113/66  Pulse 82  Temp 98.1 F (36.7 C) (Oral)  Resp 16  Ht 5\' 6"  (1.676 m)  Wt 141 lb 8 oz (64.184 kg)  BMI 22.84 kg/m2  SpO2 99%  Physical Exam Nursing notes and Vital Signs reviewed. Appearance:  Patient appears healthy, stated age, and in no acute distress Eyes:  Pupils are equal, round, and reactive to light and accomodation.  Extraocular movement is intact.  Conjunctivae are not inflamed  Ears:  Canals normal.  Tympanic membranes normal.  Nose:  Mildly congested turbinates.  No sinus tenderness.   Pharynx:  Normal Neck:  Supple.  Slightly tender shotty posterior nodes are palpated bilaterally  Lungs:  Clear to  auscultation.  Breath sounds are equal.  Heart:  Regular rate and rhythm without murmurs, rubs, or gallops.  Abdomen:  Nontender without masses or hepatosplenomegaly.  Bowel sounds are present.  No CVA or flank tenderness.  Extremities:  No edema.  No calf tenderness Skin:  No rash present.   ED Course  Procedures  none   Labs Reviewed  POCT RAPID STREP A (OFFICE) negative  STREP A DNA PROBE pending      1. Acute pharyngitis   2. Acute upper respiratory infections of unspecified site; suspect early viral URI      MDM  There is no evidence of bacterial infection today.  Throat culture pending. Treat symptomatically for now: Prescription written for Benzonatate [facility]) to take at bedtime for night-time cough.  Take Mucinex D (guaifenesin with decongestant), or plain Mucinex and Sudafed twice daily for congestion.  Increase fluid intake, rest. May use Afrin nasal spray (or generic oxymetazoline) twice daily for about 5 days.  Also recommend using saline nasal spray several times daily and saline nasal irrigation (AYR is a common brand) Stop all antihistamines for now, and other non-prescription cough/cold preparations. May take Ibuprofen 400mg , 2 tabs every 8 hours with food for sore throat. Follow-up with family doctor if not improving 7 to 10 days.         Lattie Haw, MD 05/13/12 [phone]

## 2012-05-15 ENCOUNTER — Telehealth: Payer: Self-pay | Admitting: Family Medicine

## 2012-05-20 ENCOUNTER — Other Ambulatory Visit: Payer: Self-pay | Admitting: *Deleted

## 2012-05-20 MED ORDER — LUBIPROSTONE 8 MCG PO CAPS
8.0000 ug | ORAL_CAPSULE | Freq: Two times a day (BID) | ORAL | Status: DC
Start: 2012-05-20 — End: 2012-12-28

## 2012-05-20 NOTE — Telephone Encounter (Signed)
Pt states that you gave her the Amitiza to take until she seen you. States that she is out of the samples and would like to have a prescription called in for it. States she has tolerated it well and has had no side effects. Please advise.

## 2012-05-20 NOTE — Telephone Encounter (Signed)
rx sent

## 2012-05-20 NOTE — Telephone Encounter (Signed)
Pt informed

## 2012-05-24 ENCOUNTER — Encounter: Payer: Self-pay | Admitting: Family Medicine

## 2012-05-24 ENCOUNTER — Ambulatory Visit (INDEPENDENT_AMBULATORY_CARE_PROVIDER_SITE_OTHER): Payer: Medicare Other | Admitting: Family Medicine

## 2012-05-24 VITALS — BP 113/75 | HR 70 | Wt 141.0 lb

## 2012-05-24 DIAGNOSIS — F32A Depression, unspecified: Secondary | ICD-10-CM

## 2012-05-24 DIAGNOSIS — F329 Major depressive disorder, single episode, unspecified: Secondary | ICD-10-CM

## 2012-05-24 DIAGNOSIS — F3289 Other specified depressive episodes: Secondary | ICD-10-CM

## 2012-05-24 DIAGNOSIS — J329 Chronic sinusitis, unspecified: Secondary | ICD-10-CM

## 2012-05-24 MED ORDER — AMOXICILLIN 875 MG PO TABS: 875.0000 mg | ORAL_TABLET | Freq: Two times a day (BID) | ORAL | Status: AC

## 2012-05-24 NOTE — Patient Instructions (Signed)

## 2012-05-24 NOTE — Progress Notes (Signed)
  Subjective:    Patient ID: Carolyn Hendrix, female    DOB: [birth year], 71 y.o.   MRN: [id number]  HPI Depression - doing well overall. Happy with mood improvement.   URi started about 2 weeks ago. Cough, congestion, ST, hearing her pulse in her ear.  Feels nauseated from the drainage.  Says felt better for a few days but now getting worse again.  No fever. Taking allergy meds. Did try delsym a few nights.  Using mucinex and sudafed.  Cough is slightly better.    Review of Systems     Objective:   Physical Exam  Constitutional: She is oriented to person, place, and time. She appears well-developed and well-nourished.  HENT:  Head: Normocephalic and atraumatic.  Right Ear: External ear normal.  Left Ear: External ear normal.  Nose: Nose normal.  Mouth/Throat: Oropharynx is clear and moist.       TMs and canals are clear.   Eyes: Conjunctivae and EOM are normal. Pupils are equal, round, and reactive to light.  Neck: Neck supple. No thyromegaly present.  Cardiovascular: Normal rate, regular rhythm and normal heart sounds.   Pulmonary/Chest: Effort normal and breath sounds normal. She has no wheezes.  Lymphadenopathy:    She has no cervical adenopathy.  Neurological: She is alert and oriented to person, place, and time.  Skin: Skin is warm and dry.  Psychiatric: She has a normal mood and affect.          Assessment & Plan:  Depression - PhQ- 9 score of 4 today. Continue lexapro 10mg . F/U in 4-6 months.  She is dong very well.   Sinsutis - Will tx with amoxicillin for sinusitis. Can continue mucinex and sudafed. Can use nasal saline. Call if not better in one week. Restart the zyrtec

## 2012-05-28 ENCOUNTER — Other Ambulatory Visit: Payer: Self-pay | Admitting: *Deleted

## 2012-05-28 NOTE — Telephone Encounter (Signed)
OK to refill early?

## 2012-05-28 NOTE — Telephone Encounter (Signed)
Pt called and wants to know if she can have an early refill on her xanax because she is going out of town for her sister's funeral. Not due until 8/11.please advise

## 2012-05-29 ENCOUNTER — Other Ambulatory Visit: Payer: Self-pay | Admitting: Family Medicine

## 2012-05-29 ENCOUNTER — Other Ambulatory Visit: Payer: Self-pay | Admitting: *Deleted

## 2012-05-29 MED ORDER — ALPRAZOLAM 0.5 MG PO TABS: 0.5000 mg | ORAL_TABLET | Freq: Two times a day (BID) | ORAL | Status: DC | PRN

## 2012-05-29 NOTE — Telephone Encounter (Signed)
Refilled rx

## 2012-05-29 NOTE — Telephone Encounter (Signed)
rx refill

## 2012-06-10 LAB — HM DEXA SCAN

## 2012-06-23 ENCOUNTER — Other Ambulatory Visit: Payer: Self-pay | Admitting: Family Medicine

## 2012-07-22 ENCOUNTER — Other Ambulatory Visit: Payer: Self-pay | Admitting: Family Medicine

## 2012-08-07 ENCOUNTER — Ambulatory Visit (INDEPENDENT_AMBULATORY_CARE_PROVIDER_SITE_OTHER): Payer: Medicare Other | Admitting: Family Medicine

## 2012-08-07 ENCOUNTER — Encounter: Payer: Self-pay | Admitting: Family Medicine

## 2012-08-07 ENCOUNTER — Other Ambulatory Visit: Payer: Self-pay | Admitting: Family Medicine

## 2012-08-07 VITALS — BP 116/70 | HR 72 | Ht 66.0 in | Wt 144.0 lb

## 2012-08-07 DIAGNOSIS — Z23 Encounter for immunization: Secondary | ICD-10-CM

## 2012-08-07 DIAGNOSIS — M7710 Lateral epicondylitis, unspecified elbow: Secondary | ICD-10-CM

## 2012-08-07 DIAGNOSIS — Z13220 Encounter for screening for lipoid disorders: Secondary | ICD-10-CM

## 2012-08-07 DIAGNOSIS — R5381 Other malaise: Secondary | ICD-10-CM

## 2012-08-07 DIAGNOSIS — G5600 Carpal tunnel syndrome, unspecified upper limb: Secondary | ICD-10-CM

## 2012-08-07 DIAGNOSIS — Z0000 Encounter for general adult medical examination without abnormal findings: Secondary | ICD-10-CM

## 2012-08-07 DIAGNOSIS — D649 Anemia, unspecified: Secondary | ICD-10-CM

## 2012-08-07 MED ORDER — ALPRAZOLAM 0.5 MG PO TABS: 0.5000 mg | ORAL_TABLET | Freq: Two times a day (BID) | ORAL | Status: DC | PRN

## 2012-08-07 MED ORDER — ESCITALOPRAM OXALATE 10 MG PO TABS: 10.0000 mg | ORAL_TABLET | Freq: Every day | ORAL | Status: DC

## 2012-08-07 NOTE — Progress Notes (Signed)
Subjective:    Carolyn Hendrix is a 71 y.o. female who presents for Medicare Annual/Subsequent preventive examination.  Preventive Screening-Counseling & Management  Tobacco History  Smoking status  . Former Smoker  . Quit date: 10/23/1980  Smokeless tobacco  . Not on file     Problems Prior to Visit 1. Recently bc sexually active  Current Problems (verified) Patient Active Problem List  Diagnosis  . UNSPECIFIED ANEMIA  . Depressive Disorder, not Elsewhere Classified  . GERD  . DIVERTICULOSIS OF COLON  . IRRITABLE BOWEL SYNDROME  . GROSS HEMATURIA  . ENDOMETRIOSIS  . OSTEOARTHRITIS, HIP, RIGHT  . DISC DISEASE, LUMBAR  . FATIGUE  . MEMORY LOSS  . ISCHEMIC COLITIS, HX OF    Medications Prior to Visit Current Outpatient Prescriptions on File Prior to Visit  Medication Sig Dispense Refill  . ALPRAZolam (XANAX) 0.5 MG tablet Take 1 tablet (0.5 mg total) by mouth 2 (two) times daily as needed for anxiety.  60 tablet  1  . Calcium Carbonate-Vitamin D (CALTRATE 600+D) 600-400 MG-UNIT per tablet Take 2 tablets by mouth 2 (two) times daily.        Marland Kitchen escitalopram (LEXAPRO) 10 MG tablet TAKE 1 TABLET (10 MG TOTAL) BY MOUTH DAILY.  30 tablet  0  . glucosamine-chondroitin 500-400 MG tablet Take 1 tablet by mouth 2 (two) times daily.        Marland Kitchen HYDROcodone-acetaminophen (NORCO) 10-325 MG per tablet Take 1 tablet by mouth. Take one tablet by mouth five times a day      . Omega-3 Fatty Acids (FISH OIL) 1200 MG CAPS Take by mouth.        Marland Kitchen omeprazole (PRILOSEC) 20 MG capsule TAKE ONE CAPSULE EVERY DAY  90 capsule  1  . traZODone (DESYREL) 50 MG tablet Take 50-100 mg by mouth at bedtime.        Current Medications (verified) Current Outpatient Prescriptions  Medication Sig Dispense Refill  . ALPRAZolam (XANAX) 0.5 MG tablet Take 1 tablet (0.5 mg total) by mouth 2 (two) times daily as needed for anxiety.  60 tablet  1  . Calcium Carbonate-Vitamin D (CALTRATE 600+D) 600-400 MG-UNIT  per tablet Take 2 tablets by mouth 2 (two) times daily.        Marland Kitchen escitalopram (LEXAPRO) 10 MG tablet TAKE 1 TABLET (10 MG TOTAL) BY MOUTH DAILY.  30 tablet  0  . glucosamine-chondroitin 500-400 MG tablet Take 1 tablet by mouth 2 (two) times daily.        Marland Kitchen HYDROcodone-acetaminophen (NORCO) 10-325 MG per tablet Take 1 tablet by mouth. Take one tablet by mouth five times a day      . lubiprostone (AMITIZA) 24 MCG capsule Take 24 mcg by mouth as needed.      . Omega-3 Fatty Acids (FISH OIL) 1200 MG CAPS Take by mouth.        Marland Kitchen omeprazole (PRILOSEC) 20 MG capsule TAKE ONE CAPSULE EVERY DAY  90 capsule  1  . traZODone (DESYREL) 50 MG tablet Take 50-100 mg by mouth at bedtime.         Allergies (verified) Morphine   PAST HISTORY  Family History Family History  Problem Relation Age of Onset  . Alzheimer's disease Mother   . Other Father     ACS? - 71's  . Ataxia Sister     Social History History  Substance Use Topics  . Smoking status: Former Smoker    Quit date: 10/23/1980  . Smokeless tobacco: Not  on file  . Alcohol Use: No     Are there smokers in your home (other than you)? No  Risk Factors Current exercise habits: walking, Wii Fit  Dietary issues discussed: non   Cardiac risk factors: advanced age (older than 46 for men, 44 for women).  Depression Screen (Note: if answer to either of the following is "Yes", a more complete depression screening is indicated)   Over the past two weeks, have you felt down, depressed or hopeless? No  Over the past two weeks, have you felt little interest or pleasure in doing things? No  Have you lost interest or pleasure in daily life? No  Do you often feel hopeless? No  Do you cry easily over simple problems? No  Activities of Daily Living In your present state of health, do you have any difficulty performing the following activities?:  Driving? No Managing money?  No Feeding yourself? No Getting from bed to chair? No Climbing a  flight of stairs? No Preparing food and eating?: No Bathing or showering? No Getting dressed: No Getting to the toilet? No Using the toilet:No Moving around from place to place: No In the past year have you fallen or had a near fall?:No   Are you sexually active?  Yes  Do you have more than one partner?  No  Hearing Difficulties: No Do you often ask people to speak up or repeat themselves? No Do you experience ringing or noises in your ears? No Do you have difficulty understanding soft or whispered voices? No   Do you feel that you have a problem with memory? No  Do you often misplace items? No  Do you feel safe at home?  Yes  Cognitive Testing  Alert? Yes  Normal Appearance?Yes  Oriented to person? Yes  Place? Yes   Time? Yes  Recall of three objects?  Yes  Can perform simple calculations? Yes  Displays appropriate judgment?Yes  Can read the correct time from a watch face?Yes   Advanced Directives have been discussed with the patient? Yes  List the Names of Other Physician/Practitioners you currently use: 1.  Dr. Ruby Cola (OB/GYN) 2. Dr. Donnal Debar (pain management)   Indicate any recent Medical Services you may have received from other than [facility] providers in the past year (date may be approximate).  Immunization History  Administered Date(s) Administered  . Influenza Split 08/30/2011  . Influenza Whole 08/19/2008, 08/03/2010  . Pneumococcal Polysaccharide 06/02/2009  . Td 05/16/2006  . Zoster 05/30/2011    Screening Tests Health Maintenance  Topic Date Due  . Influenza Vaccine  06/23/2012  . Mammogram  10/26/2013  . Colonoscopy  05/19/2015  . Tetanus/tdap  05/16/2016  . Pneumococcal Polysaccharide Vaccine Age 43 And Over  Completed  . Zostavax  Completed    All answers were reviewed with the patient and necessary referrals were made:  Lonita Debes, MD   08/07/2012   History reviewed: allergies, current medications, past family history, past medical  history, past social history, past surgical history and problem list  Review of Systems A comprehensive review of systems was negative.    Objective:     Vision by Snellen chart: not measured.   Body mass index is 23.24 kg/(m^2). BP 116/70  Pulse 72  Ht 5\' 6"  (1.676 m)  Wt 144 lb (65.318 kg)  BMI 23.24 kg/m2  BP 116/70  Pulse 72  Ht 5\' 6"  (1.676 m)  Wt 144 lb (65.318 kg)  BMI 23.24 kg/m2  General Appearance:  Alert, cooperative, no distress, appears stated age  Head:    Normocephalic, without obvious abnormality, atraumatic  Eyes:    PERRL, conjunctiva/corneas clear, EOM's intact, both eyes  Ears:    Normal TM's and external ear canals, both ears  Nose:   Nares normal, septum midline, mucosa normal, no drainage    or sinus tenderness  Throat:   Lips, mucosa, and tongue normal; teeth and gums normal  Neck:   Supple, symmetrical, trachea midline, no adenopathy;    thyroid:  no enlargement/tenderness/nodules; no carotid   bruit or JVD  Back:     Symmetric, no curvature, ROM normal, no CVA tenderness  Lungs:     Clear to auscultation bilaterally, respirations unlabored  Chest Wall:    No tenderness or deformity   Heart:    Regular rate and rhythm, S1 and S2 normal, 2/6 SEM murmur,  No rub or gallop  Breast Exam:    No t perfromed  Abdomen:     Soft, non-tender, bowel sounds active all four quadrants,    no masses, no organomegaly  Genitalia:    Not perfromed.   Rectal:    Not performed  Extremities:   Extremities normal, atraumatic, no cyanosis or edema  Pulses:   2+ and symmetric all extremities  Skin:   Skin color, texture, turgor normal, no rashes or lesions  Lymph nodes:   Cervical, supraclavicular, and axillary nodes normal  Neurologic:   CNII-XII intact, normal strength, sensation and reflexes    throughout       Assessment:     Annual Wellness Exam.      Plan:     During the course of the visit the patient was educated and counseled about appropriate  screening and preventive services including:    Influenza vaccine  mammo and pelvic up to date  Other vaccines up to date  Lipid screening  BP well controlled.   Diet review for nutrition referral? Yes ____  Not Indicated _x___   Patient Instructions (the written plan) was given to the patient.  Medicare Attestation I have personally reviewed: The patient's medical and social history Their use of alcohol, tobacco or illicit drugs Their current medications and supplements The patient's functional ability including ADLs,fall risks, home safety risks, cognitive, and hearing and visual impairment Diet and physical activities Evidence for depression or mood disorders  The patient's weight, height, BMI, and visual acuity have been recorded in the chart.  I have made referrals, counseling, and provided education to the patient based on review of the above and I have provided the patient with a written personalized care plan for preventive services.     Tra Wilemon, MD   08/07/2012

## 2012-08-07 NOTE — Progress Notes (Signed)
  Subjective:    Patient ID: Carolyn Hendrix, female    DOB: Oct 03, 1941, 71 y.o.   MRN: [id number]  HPI She complains of tennis elbow today on her left elbow. She said she has had flares on and off over the last several years. It initially started when she was helping take care of her grandchild that was the first time it flared. She said more recently it started about a month and half ago after she was raking leaves and using a blower doing yardwork. It is been uncomfortable since then. She says it throbs at night which tries to sleep and keeps her awake. She also has a history of carpal tunnel in her left wrist. She has been wearing her splint for her wrist as well as her tennis elbow strap without any significant relief. She has been wearing both for at least 2-3 weeks. occ using ibuprofen and has pain meds for her back. Says hands will go numb when she is drying her hair, etc.    Review of Systems     Objective:   Physical Exam   left elbow with normal range of motion. She is very tender over the lateral epicondyle. No significant pain with pronation or supination against resistance. Wrist with normal range of motion. Neg tinnels and phalens sign. Wrist with NROM.       Assessment & Plan:  Lateral epicondylitis-discussed options. She prefers to try an injection first. If she's not getting significant relief then consider that some of her pain in her elbow could be coming from her carpal tunnel syndrome.  Carpal tunnel syndrome-it was recommended that she have surgery done several years ago from her pain doctor but never had it done. She says she is starting to notice a little bit of strength decreased with her grip strength on the left hand. She's had a previous carpal tunnel release surgery on her right wrist.   Elbow Injection Procedure Note  Pre-operative Diagnosis: left lateral epicondylitis    Post-operative Diagnosis: left lateral epicongylitis   Indications:  Pain  Anesthesia: Lidocaine 1% without epinephrine without added sodium bicarbonate  Procedure Details   Verbal consent was obtained for the procedure. The point of maximum tenderness was identified and marked. The skin prepped with Betadine and a small wheel of anesthetic was injected into the subcutaneous tissue. A needle was advanced to the left lateral epicondyle and then withdrawn just slighly and 1 cc of 40 mg Kenalog and 1 cc of 1% lidocaine without epinephrine was injected. I did pull up the skin to avoid atrophy, and during injection. Patient tolerated well.  Complications:  None; patient tolerated the procedure well.

## 2012-08-08 ENCOUNTER — Other Ambulatory Visit: Payer: Self-pay | Admitting: Family Medicine

## 2012-08-08 LAB — COMPLETE METABOLIC PANEL WITH GFR
Albumin: 4.5 g/dL (ref 3.5–5.2)
Alkaline Phosphatase: 85 U/L (ref 39–117)
BUN: 14 mg/dL (ref 6–23)
CO2: 33 mEq/L — ABNORMAL HIGH (ref 19–32)
Calcium: 9.5 mg/dL (ref 8.4–10.5)
GFR, Est African American: 88 mL/min
GFR, Est Non African American: 77 mL/min
Glucose, Bld: 75 mg/dL (ref 70–99)
Potassium: 4.6 mEq/L (ref 3.5–5.3)
Total Protein: 6.8 g/dL (ref 6.0–8.3)

## 2012-08-08 LAB — CBC WITH DIFFERENTIAL/PLATELET
Basophils Absolute: 0.1 10*3/uL (ref 0.0–0.1)
Basophils Relative: 1 % (ref 0–1)
HCT: 38.4 % (ref 36.0–46.0)
Lymphocytes Relative: 26 % (ref 12–46)
MCHC: 33.6 g/dL (ref 30.0–36.0)
Neutro Abs: 4.7 10*3/uL (ref 1.7–7.7)
Neutrophils Relative %: 64 % (ref 43–77)
Platelets: 328 10*3/uL (ref 150–400)
RDW: 13.3 % (ref 11.5–15.5)
WBC: 7.2 10*3/uL (ref 4.0–10.5)

## 2012-08-08 LAB — LIPID PANEL
Cholesterol: 237 mg/dL — ABNORMAL HIGH (ref 0–200)
Total CHOL/HDL Ratio: 5.3 Ratio

## 2012-08-08 MED ORDER — PRAVASTATIN SODIUM 40 MG PO TABS: 40.0000 mg | ORAL_TABLET | Freq: Every day | ORAL | Status: DC

## 2012-09-17 ENCOUNTER — Other Ambulatory Visit: Payer: Self-pay | Admitting: Family Medicine

## 2012-10-07 ENCOUNTER — Other Ambulatory Visit: Payer: Self-pay | Admitting: Family Medicine

## 2012-12-19 ENCOUNTER — Other Ambulatory Visit: Payer: Self-pay | Admitting: Family Medicine

## 2012-12-28 ENCOUNTER — Other Ambulatory Visit: Payer: Self-pay | Admitting: Family Medicine

## 2013-02-18 ENCOUNTER — Other Ambulatory Visit: Payer: Self-pay | Admitting: Family Medicine

## 2013-03-05 ENCOUNTER — Other Ambulatory Visit: Payer: Self-pay | Admitting: *Deleted

## 2013-03-05 NOTE — Telephone Encounter (Signed)
Received fax from [facility] for refill of Escitalopram 10 mg denied due to pt not being seen since 10.16.2013. She will need to make an appt.Loralee Pacas [place]

## 2013-03-12 ENCOUNTER — Other Ambulatory Visit: Payer: Self-pay | Admitting: Family Medicine

## 2013-03-12 NOTE — Telephone Encounter (Signed)
Needs appointment

## 2013-03-18 ENCOUNTER — Encounter: Payer: Self-pay | Admitting: Family Medicine

## 2013-03-18 ENCOUNTER — Ambulatory Visit (INDEPENDENT_AMBULATORY_CARE_PROVIDER_SITE_OTHER): Payer: Medicare Other | Admitting: Family Medicine

## 2013-03-18 VITALS — BP 106/58 | HR 81 | Wt 144.0 lb

## 2013-03-18 DIAGNOSIS — E785 Hyperlipidemia, unspecified: Secondary | ICD-10-CM

## 2013-03-18 DIAGNOSIS — F329 Major depressive disorder, single episode, unspecified: Secondary | ICD-10-CM

## 2013-03-18 DIAGNOSIS — F3289 Other specified depressive episodes: Secondary | ICD-10-CM

## 2013-03-18 DIAGNOSIS — F5101 Primary insomnia: Secondary | ICD-10-CM | POA: Insufficient documentation

## 2013-03-18 DIAGNOSIS — G4700 Insomnia, unspecified: Secondary | ICD-10-CM

## 2013-03-18 MED ORDER — ESCITALOPRAM OXALATE 10 MG PO TABS: ORAL_TABLET | ORAL | Status: DC

## 2013-03-18 MED ORDER — OMEPRAZOLE 20 MG PO CPDR: DELAYED_RELEASE_CAPSULE | ORAL | Status: DC

## 2013-03-18 NOTE — Progress Notes (Signed)
  Subjective:    Patient ID: Carolyn Hendrix, female    DOB: 02/23/1941, 72 y.o.   MRN: [id number]  HPI Depression - She is ok overalll. She has had back pain for 23 years.  She feels a little more uptight recently.  She had tried to back off on her vicodin adn had a lot of pain.  Dr. Wray Kearns in [facility].  Lexapro is still working well.  She feels down occasionally.  Trazodone is wokring well to help her sleep.  Uses her xanax most days but not every day.   Hyperlipidemia-she says she took the statin that I prescribed for her over the winter for about a month. She said she had difficulty with her vehicle registration and got very confused and felt like it could be from the statin. She says she usually dependent on her husband who is now deceased for these types of things and says she wondered if that was causing the problem so she stopped it. She otherwise tolerated it well. She says she thinks she may have just gotten overly stressed about it and so decided to restart the statin about a week ago. So far she's tolerating it well.  Review of Systems     Objective:   Physical Exam  Constitutional: She is oriented to person, place, and time. She appears well-developed and well-nourished.  HENT:  Head: Normocephalic and atraumatic.  Neck: Neck supple. No thyromegaly present.  Cardiovascular: Normal rate, regular rhythm and normal heart sounds.   Pulmonary/Chest: Effort normal and breath sounds normal.  Musculoskeletal: She exhibits no edema.  Lymphadenopathy:    She has no cervical adenopathy.  Neurological: She is alert and oriented to person, place, and time.  Skin: Skin is warm and dry.  Psychiatric: She has a normal mood and affect. Her behavior is normal.          Assessment & Plan:  Depression - PHQ-9 score of 4 today. Under fair control. Refills sent to pharmacy for Lexapro 90 day supply. Followup in 6 months.  Insomnia.-Currently well controlled on  trazodone.  Hyperlipidemia-Uncontrolled.  Due to repeat lipids. Given lab slip today and she says she will go about 3 weeks when she gets back from the beach. A glide she's willing to give the statin a second chance. I suspect it was probably not the medication causing mental confusion.  She's also having some elbow pain. She said she iced it last night it actually felt better and did not bother her. She wants to try this for a few more days. If she's not improving then please come in and see me and we will evaluate her elbow.

## 2013-03-19 ENCOUNTER — Emergency Department (INDEPENDENT_AMBULATORY_CARE_PROVIDER_SITE_OTHER): Payer: Medicare Other

## 2013-03-19 ENCOUNTER — Emergency Department
Admission: EM | Admit: 2013-03-19 | Discharge: 2013-03-19 | Disposition: A | Discharge status: Home / Self Care | Payer: Medicare Other | Source: Home / Self Care | Attending: Family Medicine | Admitting: Family Medicine

## 2013-03-19 ENCOUNTER — Encounter: Payer: Self-pay | Admitting: Emergency Medicine

## 2013-03-19 DIAGNOSIS — S6990XA Unspecified injury of unspecified wrist, hand and finger(s), initial encounter: Secondary | ICD-10-CM

## 2013-03-19 DIAGNOSIS — M20019 Mallet finger of unspecified finger(s): Secondary | ICD-10-CM

## 2013-03-19 DIAGNOSIS — M20011 Mallet finger of right finger(s): Secondary | ICD-10-CM

## 2013-03-19 DIAGNOSIS — W19XXXA Unspecified fall, initial encounter: Secondary | ICD-10-CM

## 2013-03-19 NOTE — ED Notes (Signed)
Appointment with Dr T 5/29, 1:30

## 2013-03-19 NOTE — ED Notes (Signed)
C/O Rt fifth finger injury last night fell back on her hands and jammed her finger on the floor.

## 2013-03-19 NOTE — ED Provider Notes (Signed)
History     CSN: [id number]  Arrival date & time 03/19/13  1129   First MD Initiated Contact with Patient 03/19/13 1158      Chief Complaint  Patient presents with  . Finger Injury       HPI Comments: Patient was squatting on her kitchen floor last night when she lost her balance and fell backwards, jamming her right 5th finger.  She has had persistent tenderness and inability to extend her DIP joint.  Patient is a 72 y.o. female presenting with hand pain. The history is provided by the patient.  Hand Pain This is a new problem. The current episode started yesterday. The problem occurs constantly. The problem has not changed since onset.Associated symptoms comments: none. Exacerbated by: moving fingertip. Nothing relieves the symptoms. Treatments tried: splint. The treatment provided no relief.    Past Medical History  Diagnosis Date  . MVP (mitral valve prolapse)   . Depression   . Chronic LBP     Lumbar DDD  . Endometriosis   . Migraines   . Hematuria   . Colitis, ischemic   . Hyperlipidemia   . IBS (irritable bowel syndrome)     Fr Hurrelbrink- on Miralax and Citrucel daily  . GERD (gastroesophageal reflux disease)     Past Surgical History  Procedure Laterality Date  . Abdominal hysterectomy  1991    w/ bilat oophorectomy for endometriosis  . Back surgery  1993  . Laparascopic laser surgery  1992, 1994  . Lapartomy  1995  . Spinal fusion  1998  . Tonsillectomy and adenoidectomy    . Appendectomy    . Right breast biopsy- benign    . Esophagogastroduodenoscopy  06    Family History  Problem Relation Age of Onset  . Alzheimer's disease Mother   . Other Father     ACS? - 9's  . Ataxia Sister     History  Substance Use Topics  . Smoking status: Former Smoker    Quit date: 10/23/1980  . Smokeless tobacco: Not on file  . Alcohol Use: No    OB History   Grav Para Term Preterm Abortions TAB SAB Ect Mult Living                  Review of Systems    All other systems reviewed and are negative.    Allergies  Morphine  Home Medications   Current Outpatient Rx  Name  Route  Sig  Dispense  Refill  . ALPRAZolam (XANAX) 0.5 MG tablet      TAKE 1 TABLET TWICE A DAY AS NEEDED ANXIETY   60 tablet   1   . AMITIZA 8 MCG capsule      TAKE ONE CAPSULE TWICE A DAY WITH FOOD   60 capsule   3   . Calcium Carbonate-Vitamin D (CALTRATE 600+D) 600-400 MG-UNIT per tablet   Oral   Take 2 tablets by mouth 2 (two) times daily.           Marland Kitchen escitalopram (LEXAPRO) 10 MG tablet      TAKE 1 TABLET (10 MG TOTAL) BY MOUTH DAILY.   90 tablet   1   . glucosamine-chondroitin 500-400 MG tablet   Oral   Take 1 tablet by mouth 2 (two) times daily.           Marland Kitchen HYDROcodone-acetaminophen (NORCO) 10-325 MG per tablet   Oral   Take 1 tablet by mouth. Take one tablet by mouth  five times a day         . Omega-3 Fatty Acids (FISH OIL) 1200 MG CAPS   Oral   Take by mouth.           Marland Kitchen omeprazole (PRILOSEC) 20 MG capsule      TAKE ONE CAPSULE EVERY DAY   90 capsule   1   . pravastatin (PRAVACHOL) 40 MG tablet   Oral   Take 1 tablet (40 mg total) by mouth at bedtime.   30 tablet   4   . traZODone (DESYREL) 50 MG tablet   Oral   Take 50-100 mg by mouth at bedtime.           BP 113/72  Pulse 83  Temp(Src) 98 F (36.7 C) (Oral)  Ht 5\' 6"  (1.676 m)  Wt 145 lb (65.772 kg)  BMI 23.41 kg/m2  SpO2 96%  Physical Exam  Nursing note and vitals reviewed. Constitutional: She is oriented to person, place, and time. She appears well-developed and well-nourished. No distress.  HENT:  Head: Atraumatic.  Eyes: Conjunctivae are normal. Pupils are equal, round, and reactive to light.  Musculoskeletal: She exhibits tenderness.       Right hand: She exhibits decreased range of motion, tenderness and bony tenderness. She exhibits normal two-point discrimination, normal capillary refill, no deformity, no laceration and no swelling. Normal  sensation noted.       Hands: Patient has tenderness over her right 5th finger DIP joint without swelling.  She is unable to extend the DIP joint but flexion is normal.  Distal neurovascular function is intact.   Neurological: She is alert and oriented to person, place, and time.  Skin: Skin is warm and dry.    ED Course  Procedures  none  Labs Reviewed - No data to display Dg Finger Little Right  03/19/2013   *RADIOLOGY REPORT*  Clinical Data: Injury, fall.  RIGHT LITTLE FINGER 2+V  Comparison: None.  Findings: The DIP joint is imaged in a mild flexed position, reportedly unable to straighten.  There is no fracture, subluxation or dislocation.  Joint spaces are maintained.  IMPRESSION: No visible acute bony abnormality.   Original Report Authenticated By: Charlett Nose, M.D.     1. Mallet finger, acquired, right       MDM  Stack splint applied. [facility] information and instruction handout given) Leave splint in place until evaluation by Dr. Rodney Langton tomorrow.          Lattie Haw, MD 03/19/13 1327

## 2013-03-20 ENCOUNTER — Ambulatory Visit (INDEPENDENT_AMBULATORY_CARE_PROVIDER_SITE_OTHER): Payer: Medicare Other | Admitting: Sports Medicine

## 2013-03-20 ENCOUNTER — Encounter: Payer: Self-pay | Admitting: Sports Medicine

## 2013-03-20 VITALS — BP 115/67 | HR 80 | Wt 146.0 lb

## 2013-03-20 DIAGNOSIS — M20011 Mallet finger of right finger(s): Secondary | ICD-10-CM | POA: Insufficient documentation

## 2013-03-20 DIAGNOSIS — M20019 Mallet finger of unspecified finger(s): Secondary | ICD-10-CM

## 2013-03-20 NOTE — Assessment & Plan Note (Signed)
Continue extension splinting for at least 6 weeks. Return in one month to see how things are going. If she lets her right fifth distal interphalangeal joint flexed, we need to start over from scratch.

## 2013-03-20 NOTE — Progress Notes (Signed)
  Subjective:    CC: Finger injury  HPI: This pleasant 72 year old female had an unfortunate incident where she fell a few days ago, impacting her right finger. She had minimal pain over her right fifth distal interphalangeal joint, was unable to fully straighten it out. She went to urgent care where x-rays were negative for fracture and she was placed in a stack splint. She returns here having been a stack splint for a few days, but she's taken off several times and allow her finger to drop since it was placed. She has no pain. Symptoms are mild, persistent.  Past medical history, Surgical history, Family history not pertinant except as noted below, Social history, Allergies, and medications have been entered into the medical record, reviewed, and no changes needed.   Review of Systems: No fevers, chills, night sweats, weight loss, chest pain, or shortness of breath.   Objective:    General: Well Developed, well nourished, and in no acute distress.  Neuro: Alert and oriented x3, extra-ocular muscles intact, sensation grossly intact.  HEENT: Normocephalic, atraumatic, pupils equal round reactive to light, neck supple, no masses, no lymphadenopathy, thyroid nonpalpable.  Skin: Warm and dry, no rashes. Cardiac: Regular rate and rhythm, no murmurs rubs or gallops, no lower extremity edema.  Respiratory: Clear to auscultation bilaterally. Not using accessory muscles, speaking in full sentences. Right hand: There is mallet finger deformity of the right fifth distal interphalangeal joint without any tenderness to palpation. She has 0/5 strength to extension at the right distal interphalangeal joint of the fifth digit.  X-rays are reviewed and mallet deformity is visible, there is no visible fracture.  Impression and Recommendations:

## 2013-03-29 DIAGNOSIS — F329 Major depressive disorder, single episode, unspecified: Secondary | ICD-10-CM | POA: Insufficient documentation

## 2013-03-29 DIAGNOSIS — M545 Low back pain, unspecified: Secondary | ICD-10-CM | POA: Insufficient documentation

## 2013-03-29 DIAGNOSIS — G8929 Other chronic pain: Secondary | ICD-10-CM | POA: Insufficient documentation

## 2013-03-29 DIAGNOSIS — M47817 Spondylosis without myelopathy or radiculopathy, lumbosacral region: Secondary | ICD-10-CM | POA: Insufficient documentation

## 2013-03-29 DIAGNOSIS — M62838 Other muscle spasm: Secondary | ICD-10-CM | POA: Insufficient documentation

## 2013-03-29 DIAGNOSIS — M533 Sacrococcygeal disorders, not elsewhere classified: Secondary | ICD-10-CM | POA: Insufficient documentation

## 2013-03-29 DIAGNOSIS — F32A Depression, unspecified: Secondary | ICD-10-CM | POA: Insufficient documentation

## 2013-03-29 DIAGNOSIS — K5900 Constipation, unspecified: Secondary | ICD-10-CM | POA: Insufficient documentation

## 2013-03-29 DIAGNOSIS — F411 Generalized anxiety disorder: Secondary | ICD-10-CM | POA: Insufficient documentation

## 2013-03-29 DIAGNOSIS — R29898 Other symptoms and signs involving the musculoskeletal system: Secondary | ICD-10-CM | POA: Insufficient documentation

## 2013-04-15 ENCOUNTER — Other Ambulatory Visit: Payer: Self-pay | Admitting: Family Medicine

## 2013-04-15 NOTE — Telephone Encounter (Signed)
It looks like this is too early?

## 2013-04-17 ENCOUNTER — Ambulatory Visit (INDEPENDENT_AMBULATORY_CARE_PROVIDER_SITE_OTHER): Payer: Medicare Other | Admitting: Sports Medicine

## 2013-04-17 ENCOUNTER — Encounter: Payer: Self-pay | Admitting: Sports Medicine

## 2013-04-17 VITALS — BP 118/72 | HR 95 | Wt 143.0 lb

## 2013-04-17 DIAGNOSIS — M20019 Mallet finger of unspecified finger(s): Secondary | ICD-10-CM

## 2013-04-17 DIAGNOSIS — M20011 Mallet finger of right finger(s): Secondary | ICD-10-CM

## 2013-04-17 NOTE — Progress Notes (Signed)
  Subjective:    CC: Followup  HPI: I have been seeing this pleasant 72 year old female for the past 4 weeks, she has a right-sided mallet finger. She has been in constant extension splinting for the past 4 weeks, and is pain-free today.  Past medical history, Surgical history, Family history not pertinant except as noted below, Social history, Allergies, and medications have been entered into the medical record, reviewed, and no changes needed.   Review of Systems: No fevers, chills, night sweats, weight loss, chest pain, or shortness of breath.   Objective:    General: Well Developed, well nourished, and in no acute distress.  Neuro: Alert and oriented x3, extra-ocular muscles intact, sensation grossly intact.  HEENT: Normocephalic, atraumatic, pupils equal round reactive to light, neck supple, no masses, no lymphadenopathy, thyroid nonpalpable.  Skin: Warm and dry, no rashes. Cardiac: Regular rate and rhythm, no murmurs rubs or gallops, no lower extremity edema.  Respiratory: Clear to auscultation bilaterally. Not using accessory muscles, speaking in full sentences. Right hand: The splint is removed, and she is able to maintain full extension of the fifth distal interphalangeal joint. I did not attempt to bend the joint. Splint was reapplied.  Impression and Recommendations:

## 2013-04-17 NOTE — Assessment & Plan Note (Signed)
When I first saw her she was unable to extend at the distal interphalangeal joint, and had significant extension lag. Today she is able to hold her finger and full extension. Continue extension splinting for an additional 4 weeks. Return to see me, and we can consider discontinuing of the splint.

## 2013-05-15 ENCOUNTER — Ambulatory Visit (INDEPENDENT_AMBULATORY_CARE_PROVIDER_SITE_OTHER): Payer: Medicare Other | Admitting: Sports Medicine

## 2013-05-15 VITALS — BP 114/69 | HR 81 | Wt 148.0 lb

## 2013-05-15 DIAGNOSIS — G5600 Carpal tunnel syndrome, unspecified upper limb: Secondary | ICD-10-CM

## 2013-05-15 DIAGNOSIS — M7710 Lateral epicondylitis, unspecified elbow: Secondary | ICD-10-CM

## 2013-05-15 DIAGNOSIS — M20011 Mallet finger of right finger(s): Secondary | ICD-10-CM

## 2013-05-15 DIAGNOSIS — M7712 Lateral epicondylitis, left elbow: Secondary | ICD-10-CM | POA: Insufficient documentation

## 2013-05-15 DIAGNOSIS — M20019 Mallet finger of unspecified finger(s): Secondary | ICD-10-CM

## 2013-05-15 DIAGNOSIS — G5602 Carpal tunnel syndrome, left upper limb: Secondary | ICD-10-CM | POA: Insufficient documentation

## 2013-05-15 MED ORDER — IBUPROFEN 800 MG PO TABS: 800.0000 mg | ORAL_TABLET | Freq: Three times a day (TID) | ORAL | Status: DC | PRN

## 2013-05-15 NOTE — Progress Notes (Addendum)
  Subjective:    CC: Followup  HPI: Mallet finger:  Right fifth distal interphalangeal joint has been immobilized in extension for 8 weeks now. She is in no pain.  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 More recently she's been wearing her counter force brace, but not doing exercises. She's not taking any oral medications for this yet. It is localized, doesn't radiate, moderate.  Left carpal tunnel syndrome: Numbness, tingling into the first through third digits of the left hand. She does have a night splint but she's wearing. Per patient this is EMG and nerve conduction confirmed. She desires to pursue conservative measures today.  Past medical history, Surgical history, Family history not pertinant except as noted below, Social history, Allergies, and medications have been entered into the medical record, reviewed, and no changes needed.   Review of Systems: No fevers, chills, night sweats, weight loss, chest pain, or shortness of breath.   Objective:    General: Well Developed, well nourished, and in no acute distress.  Neuro: Alert and oriented x3, extra-ocular muscles intact, sensation grossly intact.  HEENT: Normocephalic, atraumatic, pupils equal round reactive to light, neck supple, no masses, no lymphadenopathy, thyroid nonpalpable.  Skin: Warm and dry, no rashes. Cardiac: Regular rate and rhythm, no murmurs rubs or gallops, no lower extremity edema.  Respiratory: Clear to auscultation bilaterally. Not using accessory muscles, speaking in full sentences. Right hand: Fifth distal interphalangeal joint lacks approximately 3 of extension but she does maintain excellent strength to extension and flexion of the joint. Left Elbow: Unremarkable to inspection. Range of motion full pronation, supination, flexion, extension. Strength is full to all of the above directions Stable to varus, valgus  stress. Negative moving valgus stress test. Moderately tender to palpation at the common extensor tendon origin of the lateral epicondyle. Ulnar nerve does not sublux. Negative cubital tunnel Tinel's.  Impression and Recommendations:

## 2013-05-15 NOTE — Assessment & Plan Note (Signed)
Wear night splints. Ibuprofen 800 mg 3 times a day. Home exercises. If no better in 2 weeks I will perform a median nerve hydrodissection.

## 2013-05-15 NOTE — Assessment & Plan Note (Addendum)
We finished 8 weeks of extension splinting. She has excellent strength to extension, and only has a couple of degrees of extension lag. Turning her loose regarding this.

## 2013-05-15 NOTE — Assessment & Plan Note (Signed)
Ibuprofen 800 mg 3 times a day. Counterforce brace which she already has at home. Home exercises. Return in 2 weeks, consider injection if no better.

## 2013-05-29 ENCOUNTER — Ambulatory Visit (INDEPENDENT_AMBULATORY_CARE_PROVIDER_SITE_OTHER): Payer: Medicare Other | Admitting: Sports Medicine

## 2013-05-29 ENCOUNTER — Encounter: Payer: Self-pay | Admitting: Sports Medicine

## 2013-05-29 VITALS — BP 118/70 | HR 73 | Wt 147.0 lb

## 2013-05-29 DIAGNOSIS — G5600 Carpal tunnel syndrome, unspecified upper limb: Secondary | ICD-10-CM

## 2013-05-29 DIAGNOSIS — G5602 Carpal tunnel syndrome, left upper limb: Secondary | ICD-10-CM

## 2013-05-29 DIAGNOSIS — M20011 Mallet finger of right finger(s): Secondary | ICD-10-CM

## 2013-05-29 DIAGNOSIS — M7712 Lateral epicondylitis, left elbow: Secondary | ICD-10-CM

## 2013-05-29 DIAGNOSIS — M20019 Mallet finger of unspecified finger(s): Secondary | ICD-10-CM

## 2013-05-29 DIAGNOSIS — M7710 Lateral epicondylitis, unspecified elbow: Secondary | ICD-10-CM

## 2013-05-29 NOTE — Assessment & Plan Note (Signed)
Did extremely well with each weeks of extension splinting, unfortunately the mallet finger has returned. I'm going to send her to Dr. Janee Morn with [facility] for consideration of surgical repair.

## 2013-05-29 NOTE — Assessment & Plan Note (Signed)
This has been confirmed with nerve conduction in the past. Median nerve hydrodissection as above. Continue night splinting. Return in one month.

## 2013-05-29 NOTE — Progress Notes (Signed)
  Subjective:    CC: Followup  HPI: Right fifth distal interphalangeal mallet finger: Did well after 8 weeks of extension splinting, at the last visit she had full extension without lag. Importantly she returns today with recurrence of the mallet finger, and inability to fully straighten it out.  Left lateral epicondylitis: Failed counterforce bracing, ibuprofen, home exercises. Desires interventional treatment.  Left carpal tunnel syndrome: Status post right carpal tunnel release, I placed her through some home exercises, night splinting, analgesics, anti-inflammatories. She has nerve conduction confirmed carpal tunnel. Overall no improvement, desires interventional treatment.  Past medical history, Surgical history, Family history not pertinant except as noted below, Social history, Allergies, and medications have been entered into the medical record, reviewed, and no changes needed.   Review of Systems: No fevers, chills, night sweats, weight loss, chest pain, or shortness of breath.   Objective:    General: Well Developed, well nourished, and in no acute distress.  Neuro: Alert and oriented x3, extra-ocular muscles intact, sensation grossly intact.  HEENT: Normocephalic, atraumatic, pupils equal round reactive to light, neck supple, no masses, no lymphadenopathy, thyroid nonpalpable.  Skin: Warm and dry, no rashes. Cardiac: Regular rate and rhythm, no murmurs rubs or gallops, no lower extremity edema.  Respiratory: Clear to auscultation bilaterally. Not using accessory muscles, speaking in full sentences. Right hand: She has proximally 45 of extension lag, and 0/5 strength to extension at the distal interphalangeal joint of the fifth digit.  Procedure: Real-time Ultrasound Guided Injection of left common extensor tendon origin Device: GE Logiq E  Verbal informed consent obtained.  Time-out conducted.  Noted no overlying erythema, induration, or other signs of local infection.  Skin  prepped in a sterile fashion.  Local anesthesia: Topical Ethyl chloride.  With sterile technique and under real time ultrasound guidance:  A total 1 cc Kenalog 40, 3 cc lidocaine injected both superficial to and deep to the insertion of the tendon.  Completed without difficulty  Pain immediately resolved suggesting accurate placement of the medication.  Advised to call if fevers/chills, erythema, induration, drainage, or persistent bleeding.  Images permanently stored and available for review in the ultrasound unit.  Impression: Technically successful ultrasound guided injection.  Procedure: Real-time Ultrasound Guided Injection/hydrodissection of the left median nerve Device: GE Logiq E  Verbal informed consent obtained.  Time-out conducted.  Noted no overlying erythema, induration, or other signs of local infection.  Skin prepped in a sterile fashion.  Local anesthesia: Topical Ethyl chloride.  With sterile technique and under real time ultrasound guidance:  Noted enlarged median nerve on ultrasound, needle advanced both superficial to the median nerve as well as deep to it, I injected a total of 1 cc Kenalog 40, 3 cc lidocaine between the nerve in the transverse carpal ligament, as well as deep into the carpal tunnel. Completed without difficulty  Pain immediately resolved suggesting accurate placement of the medication.  Advised to call if fevers/chills, erythema, induration, drainage, or persistent bleeding.  Images permanently stored and available for review in the ultrasound unit.  Impression: Technically successful ultrasound guided hydrodissection.  Impression and Recommendations:

## 2013-05-29 NOTE — Assessment & Plan Note (Signed)
Failed conservative measures with counter force bracing, rehabilitation exercises, ibuprofen for one month. Common extensor tendon injection as above. Return in one month.

## 2013-06-11 ENCOUNTER — Other Ambulatory Visit: Payer: Self-pay | Admitting: Family Medicine

## 2013-06-12 ENCOUNTER — Telehealth: Payer: Self-pay | Admitting: *Deleted

## 2013-06-26 ENCOUNTER — Ambulatory Visit: Payer: Medicare Other | Admitting: Sports Medicine

## 2013-07-01 NOTE — Telephone Encounter (Signed)
g

## 2013-07-02 ENCOUNTER — Other Ambulatory Visit: Payer: Self-pay | Admitting: Family Medicine

## 2013-08-11 ENCOUNTER — Other Ambulatory Visit: Payer: Self-pay | Admitting: Family Medicine

## 2013-08-13 ENCOUNTER — Encounter: Payer: Self-pay | Admitting: Family Medicine

## 2013-08-13 ENCOUNTER — Ambulatory Visit (INDEPENDENT_AMBULATORY_CARE_PROVIDER_SITE_OTHER): Payer: Medicare Other | Admitting: Family Medicine

## 2013-08-13 VITALS — BP 127/69 | HR 105 | Wt 144.0 lb

## 2013-08-13 DIAGNOSIS — D509 Iron deficiency anemia, unspecified: Secondary | ICD-10-CM

## 2013-08-13 DIAGNOSIS — R109 Unspecified abdominal pain: Secondary | ICD-10-CM

## 2013-08-13 DIAGNOSIS — R140 Abdominal distension (gaseous): Secondary | ICD-10-CM

## 2013-08-13 DIAGNOSIS — R141 Gas pain: Secondary | ICD-10-CM

## 2013-08-13 DIAGNOSIS — K589 Irritable bowel syndrome without diarrhea: Secondary | ICD-10-CM

## 2013-08-13 DIAGNOSIS — E785 Hyperlipidemia, unspecified: Secondary | ICD-10-CM

## 2013-08-13 NOTE — Progress Notes (Signed)
Subjective:    Patient ID: Carolyn Hendrix, female    DOB: Dec 23, 1940, 72 y.o.   MRN: [id number]  HPI Followup constipation predominant irritable bowel syndrome-she's been on the Amitiza for him as to year and feels like it's not really working very well. She then discontinued it for a period time just to see.Usinng a stool softerner and the probiotic at night. Still gets a lot of abdominal pain, bloating and gas. No blood in her stools. Her last colonoscopy is up-to-date. Last colonoscopy was in 2006  she was told to followup in 10 years.  Hyperlipidemia-tolerating pravastatin well without side effects or problems. Last labs were approximately a year ago. Lab Results  Component Value Date   CHOL 237* 08/07/2012   HDL 45 08/07/2012   LDLCALC 158* 08/07/2012   TRIG 171* 08/07/2012   CHOLHDL 5.3 08/07/2012   Iron deficiency anemia-July to have her hemoglobin and ferritin rechecked. He does complain of fatigue. Review of Systems  BP 127/69  Pulse 105  Wt 144 lb (65.318 kg)  BMI 23.25 kg/m2    Allergies  Allergen Reactions  . Morphine     Past Medical History  Diagnosis Date  . MVP (mitral valve prolapse)   . Depression   . Chronic LBP     Lumbar DDD  . Endometriosis   . Migraines   . Hematuria   . Colitis, ischemic   . Hyperlipidemia   . IBS (irritable bowel syndrome)     Fr Hurrelbrink- on Miralax and Citrucel daily  . GERD (gastroesophageal reflux disease)     Past Surgical History  Procedure Laterality Date  . Abdominal hysterectomy  1991    w/ bilat oophorectomy for endometriosis  . Back surgery  1993  . Laparascopic laser surgery  1992, 1994  . Lapartomy  1995  . Spinal fusion  1998  . Tonsillectomy and adenoidectomy    . Appendectomy    . Right breast biopsy- benign    . Esophagogastroduodenoscopy  06    History   Social History  . Marital Status: Widowed    Spouse Name: N/A    Number of Children: N/A  . Years of Education: N/A   Occupational  History  . Not on file.   Social History Main Topics  . Smoking status: Former Smoker    Quit date: 10/23/1980  . Smokeless tobacco: Not on file  . Alcohol Use: No  . Drug Use:   . Sexual Activity:    Other Topics Concern  . Not on file   Social History Narrative  . No narrative on file    Family History  Problem Relation Age of Onset  . Alzheimer's disease Mother   . Other Father     ACS? - 55's  . Ataxia Sister     Outpatient Encounter Prescriptions as of 08/13/2013  Medication Sig Dispense Refill  . ALPRAZolam (XANAX) 0.5 MG tablet TAKE 1 TABLET BY MOUTH TWICE A DAY AS NEEDED FOR ANXIETY  60 tablet  1  . Calcium Carbonate-Vitamin D (CALTRATE 600+D) 600-400 MG-UNIT per tablet Take 2 tablets by mouth 2 (two) times daily.        Marland Kitchen escitalopram (LEXAPRO) 10 MG tablet TAKE 1 TABLET (10 MG TOTAL) BY MOUTH DAILY.  90 tablet  1  . glucosamine-chondroitin 500-400 MG tablet Take 1 tablet by mouth 2 (two) times daily.        Marland Kitchen HYDROcodone-acetaminophen (NORCO) 10-325 MG per tablet Take 1 tablet by mouth. Take one  tablet by mouth five times a day      . ibuprofen (ADVIL,MOTRIN) 800 MG tablet Take 1 tablet (800 mg total) by mouth every 8 (eight) hours as needed for pain.  60 tablet  2  . Omega-3 Fatty Acids (FISH OIL) 1200 MG CAPS Take by mouth.        Marland Kitchen omeprazole (PRILOSEC) 20 MG capsule TAKE ONE CAPSULE EVERY DAY  90 capsule  1  . pravastatin (PRAVACHOL) 40 MG tablet TAKE 1 TABLET AT BEDTIME  30 tablet  4  . traZODone (DESYREL) 50 MG tablet Take 50-100 mg by mouth at bedtime.      . [DISCONTINUED] AMITIZA 8 MCG capsule TAKE ONE CAPSULE TWICE A DAY WITH FOOD  60 capsule  3   No facility-administered encounter medications on file as of 08/13/2013.          Objective:   Physical Exam  Constitutional: She is oriented to person, place, and time. She appears well-developed and well-nourished.  HENT:  Head: Normocephalic and atraumatic.  Abdominal: Soft. Bowel sounds are normal.  She exhibits no distension and no mass. There is no tenderness. There is no rebound and no guarding.  Neurological: She is alert and oriented to person, place, and time.  Skin: Skin is warm and dry.  Psychiatric: She has a normal mood and affect. Her behavior is normal.          Assessment & Plan:  Irritable bowel syndrome, constipation predominant-we discussed that the Amitiza should help with the bowel movements but may or may not significantly improve pain and discomfort. I would also like to test her for milk allergy as well as celiac. I think this is reasonable and she's never been tested for this before. We can try switching to Linzess. Samples given. She will call if she feels it's helpful I would like a prescription for it.  Deficiency anemia-we'll recheck hemoglobin and ferritin today.  Hyperlipidemia-due to repeat  lipids today. Has been one year. Also will check liver enzymes.

## 2013-08-14 LAB — LIPID PANEL
LDL Cholesterol: 85 mg/dL (ref 0–99)
VLDL: 25 mg/dL (ref 0–40)

## 2013-08-14 LAB — COMPLETE METABOLIC PANEL WITH GFR
ALT: 18 U/L (ref 0–35)
AST: 22 U/L (ref 0–37)
Albumin: 4.2 g/dL (ref 3.5–5.2)
CO2: 31 mEq/L (ref 19–32)
Calcium: 9.2 mg/dL (ref 8.4–10.5)
Chloride: 99 mEq/L (ref 96–112)
GFR, Est African American: >89 mL/min
Potassium: 4.6 mEq/L (ref 3.5–5.3)
Sodium: 138 mEq/L (ref 135–145)
Total Protein: 6.9 g/dL (ref 6.0–8.3)

## 2013-08-14 LAB — CBC
Hemoglobin: 12.8 g/dL (ref 12.0–15.0)
Platelets: 311 10*3/uL (ref 150–400)
RBC: 4.07 MIL/uL (ref 3.87–5.11)

## 2013-08-14 LAB — FERRITIN: Ferritin: 76 ng/mL (ref 10–291)

## 2013-08-14 NOTE — Addendum Note (Signed)
Addended by: Deno Etienne on: 08/14/2013 10:36 AM   Modules accepted: Orders

## 2013-08-14 NOTE — Addendum Note (Signed)
Addended by: Deno Etienne on: 08/14/2013 10:19 AM   Modules accepted: Orders

## 2013-08-15 LAB — ALLERGEN MILK: Milk IgE: <0.1 kU/L

## 2013-08-15 LAB — GLIA (IGA/G) + TTG IGA
Gliadin IgA: 4.6 U/mL (ref <20)
Tissue Transglutaminase Ab, IgA: 5.1 U/mL (ref <20)

## 2013-08-29 ENCOUNTER — Other Ambulatory Visit: Payer: Self-pay | Admitting: *Deleted

## 2013-08-29 MED ORDER — PRAVASTATIN SODIUM 40 MG PO TABS: ORAL_TABLET | ORAL | Status: DC

## 2013-09-03 DIAGNOSIS — M961 Postlaminectomy syndrome, not elsewhere classified: Secondary | ICD-10-CM | POA: Insufficient documentation

## 2013-09-08 ENCOUNTER — Other Ambulatory Visit: Payer: Self-pay | Admitting: Family Medicine

## 2013-09-22 ENCOUNTER — Ambulatory Visit: Payer: Medicare Other | Admitting: Family Medicine

## 2013-09-23 ENCOUNTER — Ambulatory Visit (INDEPENDENT_AMBULATORY_CARE_PROVIDER_SITE_OTHER): Payer: Medicare Other | Admitting: Family Medicine

## 2013-09-23 ENCOUNTER — Encounter: Payer: Self-pay | Admitting: Family Medicine

## 2013-09-23 VITALS — BP 115/67 | HR 87 | Temp 97.6°F | Ht 66.0 in | Wt 145.0 lb

## 2013-09-23 DIAGNOSIS — K900 Celiac disease: Secondary | ICD-10-CM

## 2013-09-23 DIAGNOSIS — K9041 Non-celiac gluten sensitivity: Secondary | ICD-10-CM | POA: Insufficient documentation

## 2013-09-23 DIAGNOSIS — Z0000 Encounter for general adult medical examination without abnormal findings: Secondary | ICD-10-CM

## 2013-09-23 MED ORDER — ESCITALOPRAM OXALATE 10 MG PO TABS: ORAL_TABLET | ORAL | Status: DC

## 2013-09-23 NOTE — Progress Notes (Signed)
Subjective:    Carolyn Hendrix is a 72 y.o. female who presents for Medicare Annual/Subsequent preventive examination.  Preventive Screening-Counseling & Management  Tobacco History  Smoking status  . Former Smoker  . Quit date: 10/23/1980  Smokeless tobacco  . Not on file     Problems Prior to Visit 1.  pt has had labs, she has living will, she has not had her eyes examined this year however, she is going to schedule  2. hypertension repair performed on the small finger in August this year. 3. she has been seeing a chiropractor regularly for her back. Has been helpful and she's been doing some therapy exercises through them as well.   Current Problems (verified) Patient Active Problem List   Diagnosis Date Noted  . 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 Left lateral epicondylitis 05/15/2013  . 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 Insomnia 03/18/2013  . OSTEOARTHRITIS, HIP, RIGHT 02/14/2010  . UNSPECIFIED ANEMIA 06/02/2009  . 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 IRRITABLE BOWEL SYNDROME 06/02/2008  . FATIGUE 06/02/2008  . GERD 03/30/2008  . DIVERTICULOSIS OF COLON 03/30/2008  . GROSS HEMATURIA 02/25/2008  . Depressive Disorder, not Elsewhere Classified 02/17/2008  . ENDOMETRIOSIS 02/17/2008  . DISC DISEASE, LUMBAR 02/17/2008  . ISCHEMIC COLITIS, HX OF 02/17/2008    Medications Prior to Visit Current Outpatient Prescriptions on File Prior to Visit  Medication Sig Dispense Refill  . ALPRAZolam (XANAX) 0.5 MG tablet TAKE 1 TABLET BY MOUTH TWICE A DAY AS NEEDED FOR ANXIETY  60 tablet  1  . Calcium Carbonate-Vitamin D (CALTRATE 600+D) 600-400 MG-UNIT per tablet Take 2 tablets by mouth 2 (two) times daily.        Marland Kitchen glucosamine-chondroitin 500-400 MG tablet Take 1 tablet by mouth 2 (two) times daily.        Marland Kitchen HYDROcodone-acetaminophen (NORCO) 10-325 MG per tablet Take 1 tablet by mouth. Take one tablet by mouth five times a day      . ibuprofen  (ADVIL,MOTRIN) 800 MG tablet Take 1 tablet (800 mg total) by mouth every 8 (eight) hours as needed for pain.  60 tablet  2  . Omega-3 Fatty Acids (FISH OIL) 1200 MG CAPS Take by mouth.        Marland Kitchen omeprazole (PRILOSEC) 20 MG capsule TAKE ONE CAPSULE EVERY DAY  90 capsule  1  . pravastatin (PRAVACHOL) 40 MG tablet TAKE 1 TABLET AT BEDTIME  90 tablet  1  . traZODone (DESYREL) 50 MG tablet Take 50-100 mg by mouth at bedtime.       No current facility-administered medications on file prior to visit.    Current Medications (verified) Current Outpatient Prescriptions  Medication Sig Dispense Refill  . ALPRAZolam (XANAX) 0.5 MG tablet TAKE 1 TABLET BY MOUTH TWICE A DAY AS NEEDED FOR ANXIETY  60 tablet  1  . Calcium Carbonate-Vitamin D (CALTRATE 600+D) 600-400 MG-UNIT per tablet Take 2 tablets by mouth 2 (two) times daily.        Marland Kitchen escitalopram (LEXAPRO) 10 MG tablet TAKE 1 TABLET (10 MG TOTAL) BY MOUTH DAILY.  90 tablet  1  . glucosamine-chondroitin 500-400 MG tablet Take 1 tablet by mouth 2 (two) times daily.        Marland Kitchen HYDROcodone-acetaminophen (NORCO) 10-325 MG per tablet Take 1 tablet by mouth. Take one tablet by mouth five times a day      . ibuprofen (ADVIL,MOTRIN) 800 MG tablet Take 1 tablet (800 mg total) by  mouth every 8 (eight) hours as needed for pain.  60 tablet  2  . Omega-3 Fatty Acids (FISH OIL) 1200 MG CAPS Take by mouth.        Marland Kitchen omeprazole (PRILOSEC) 20 MG capsule TAKE ONE CAPSULE EVERY DAY  90 capsule  1  . pravastatin (PRAVACHOL) 40 MG tablet TAKE 1 TABLET AT BEDTIME  90 tablet  1  . traZODone (DESYREL) 50 MG tablet Take 50-100 mg by mouth at bedtime.       No current facility-administered medications for this visit.     Allergies (verified) Morphine   PAST HISTORY  Family History Family History  Problem Relation Age of Onset  . Alzheimer's disease Mother   . Other Father     ACS? - 62's  . Ataxia Sister     Social History History  Substance Use Topics  . Smoking  status: Former Smoker    Quit date: 10/23/1980  . Smokeless tobacco: Not on file  . Alcohol Use: No     Are there smokers in your home (other than you)? No  Risk Factors Current exercise habits: therapy exercises through chiropractor  Dietary issues discussed: gluten sensitive   Cardiac risk factors: advanced age (older than 61 for men, 39 for women).  Depression Screen (Note: if answer to either of the following is "Yes", a more complete depression screening is indicated)   Over the past two weeks, have you felt down, depressed or hopeless? No  Over the past two weeks, have you felt little interest or pleasure in doing things? No  Have you lost interest or pleasure in daily life? No  Do you often feel hopeless? No  Do you cry easily over simple problems? No  Activities of Daily Living In your present state of health, do you have any difficulty performing the following activities?:  Driving? No Managing money?  No Feeding yourself? No Getting from bed to chair? No Climbing a flight of stairs? No Preparing food and eating?: No Bathing or showering? No Getting dressed: No Getting to the toilet? No Using the toilet:No Moving around from place to place: No In the past year have you fallen or had a near fall?:No   Are you sexually active?  Yes  Do you have more than one partner?  No  Hearing Difficulties: No Do you often ask people to speak up or repeat themselves? No Do you experience ringing or noises in your ears? No Do you have difficulty understanding soft or whispered voices? No   Do you feel that you have a problem with memory? No  Do you often misplace items? No  Do you feel safe at home?  Yes  Cognitive Testing  Alert? Yes  Normal Appearance?Yes  Oriented to person? Yes  Place? Yes   Time? Yes  Recall of three objects?  Yes  Can perform simple calculations? Yes  Displays appropriate judgment?Yes  Can read the correct time from a watch face?Yes   Advanced  Directives have been discussed with the patient? Yes  List the Names of Other Physician/Practitioners you currently use: 1.  Dr. Ruby Cola (gyn) 2. Dr. Percell Boston 3  Dr. Lendon Collar ( chiropractor)  Indicate any recent Medical Services you may have received from other than [facility] providers in the past year (date may be approximate).  Immunization History  Administered Date(s) Administered  . Influenza Split 08/30/2011, 08/07/2012  . Influenza Whole 08/19/2008, 08/03/2010  . Influenza, Seasonal, Injecte, Preservative Fre 07/22/2013  . Pneumococcal Polysaccharide-23  06/02/2009  . Td 05/16/2006  . Zoster 05/30/2011    Screening Tests Health Maintenance  Topic Date Due  . Influenza Vaccine  05/23/2014  . Colonoscopy  05/19/2015  . Tetanus/tdap  05/16/2016  . Pneumococcal Polysaccharide Vaccine Age 58 And Over  Completed  . Zostavax  Completed    All answers were reviewed with the patient and necessary referrals were made:  Zacariah Belue, MD   09/23/2013   History reviewed: allergies, current medications, past family history, past medical history, past social history, past surgical history and problem list  Review of Systems A comprehensive review of systems was negative.    Objective:     Vision by Snellen chart: right eye:20/30, left eye:20/50  Body mass index is 23.41 kg/(m^2). BP 115/67  Pulse 87  Temp(Src) 97.6 F (36.4 C)  Ht 5\' 6"  (1.676 m)  Wt 145 lb (65.772 kg)  BMI 23.41 kg/m2  BP 115/67  Pulse 87  Temp(Src) 97.6 F (36.4 C)  Ht 5\' 6"  (1.676 m)  Wt 145 lb (65.772 kg)  BMI 23.41 kg/m2  General Appearance:    Alert, cooperative, no distress, appears stated age  Head:    Normocephalic, without obvious abnormality, atraumatic  Eyes:    PERRL, conjunctiva/corneas clear, EOM's intact, both eyes  Ears:    Normal TM's and external ear canals, both ears  Nose:   Nares normal, septum midline, mucosa normal, no drainage    or sinus tenderness  Throat:   Lips,  mucosa, and tongue normal; teeth and gums normal  Neck:   Supple, symmetrical, trachea midline, no adenopathy;    thyroid:  no enlargement/tenderness/nodules; no carotid   bruit or JVD  Back:     Symmetric, no curvature, ROM normal, no CVA tenderness  Lungs:     Clear to auscultation bilaterally, respirations unlabored  Chest Wall:    No tenderness or deformity   Heart:    Regular rate and rhythm, S1 and S2 normal, no murmur, rub   or gallop  Breast Exam:    Not perfromed  Abdomen:     Soft, non-tender, bowel sounds active all four quadrants,    no masses, no organomegaly  Genitalia:    Not performed  Rectal:    Not performed.   Extremities:   Extremities normal, atraumatic, no cyanosis or edema  Pulses:   2+ and symmetric all extremities  Skin:   Skin color, texture, turgor normal, no rashes or lesions  Lymph nodes:   Cervical, supraclavicular, and axillary nodes normal  Neurologic:   CNII-XII intact, normal strength, sensation and reflexes    throughout       Assessment:     Annual Wellness     Plan:     During the course of the visit the patient was educated and counseled about appropriate screening and preventive services including:    plans on schedule eye exam, has had some double vision in the left eye  Diet review for nutrition referral? Yes ____  Not Indicated X__   Patient Instructions (the written plan) was given to the patient.  Medicare Attestation I have personally reviewed: The patient's medical and social history Their use of alcohol, tobacco or illicit drugs Their current medications and supplements The patient's functional ability including ADLs,fall risks, home safety risks, cognitive, and hearing and visual impairment Diet and physical activities Evidence for depression or mood disorders  The patient's weight, height, BMI, and visual acuity have been recorded in the chart.  I have  made referrals, counseling, and provided education to the patient  based on review of the above and I have provided the patient with a written personalized care plan for preventive services.     Victorious Cosio, MD   09/23/2013

## 2013-10-09 ENCOUNTER — Other Ambulatory Visit: Payer: Self-pay | Admitting: Family Medicine

## 2013-10-23 ENCOUNTER — Other Ambulatory Visit: Payer: Self-pay | Admitting: Family Medicine

## 2013-12-31 ENCOUNTER — Other Ambulatory Visit: Payer: Self-pay | Admitting: Family Medicine

## 2014-02-24 ENCOUNTER — Other Ambulatory Visit: Payer: Self-pay | Admitting: Physician Assistant

## 2014-03-01 ENCOUNTER — Other Ambulatory Visit: Payer: Self-pay | Admitting: Family Medicine

## 2014-03-02 ENCOUNTER — Other Ambulatory Visit: Payer: Self-pay | Admitting: Physician Assistant

## 2014-03-24 ENCOUNTER — Encounter: Payer: Self-pay | Admitting: Family Medicine

## 2014-03-24 ENCOUNTER — Ambulatory Visit (INDEPENDENT_AMBULATORY_CARE_PROVIDER_SITE_OTHER): Payer: Medicare Other | Admitting: Family Medicine

## 2014-03-24 VITALS — BP 120/76 | HR 80 | Wt 144.0 lb

## 2014-03-24 DIAGNOSIS — F32A Depression, unspecified: Secondary | ICD-10-CM

## 2014-03-24 DIAGNOSIS — K900 Celiac disease: Secondary | ICD-10-CM

## 2014-03-24 DIAGNOSIS — K219 Gastro-esophageal reflux disease without esophagitis: Secondary | ICD-10-CM

## 2014-03-24 DIAGNOSIS — F329 Major depressive disorder, single episode, unspecified: Secondary | ICD-10-CM

## 2014-03-24 DIAGNOSIS — F3289 Other specified depressive episodes: Secondary | ICD-10-CM

## 2014-03-24 DIAGNOSIS — K9041 Non-celiac gluten sensitivity: Secondary | ICD-10-CM

## 2014-03-24 MED ORDER — ALPRAZOLAM 0.5 MG PO TABS: ORAL_TABLET | ORAL | Status: DC

## 2014-03-24 MED ORDER — ESCITALOPRAM OXALATE 10 MG PO TABS: ORAL_TABLET | ORAL | Status: DC

## 2014-03-24 NOTE — Progress Notes (Signed)
   Subjective:    Patient ID: Carolyn Hendrix, female    DOB: August 12, 1941, 73 y.o.   MRN: [id number]  HPI Six-month followup for depression-she's currently on Lexapro 10 mg. Sleeping well. Got married in November and very hapy.   GERD-Still taking prilosec. Still on calcium to protect her bones.    Gluten intolreance - doing well on her gluten diet.   Review of Systems     Objective:   Physical Exam  Constitutional: She is oriented to person, place, and time. She appears well-developed and well-nourished.  HENT:  Head: Normocephalic and atraumatic.  Cardiovascular: Normal rate, regular rhythm and normal heart sounds.   Pulmonary/Chest: Effort normal and breath sounds normal.  Neurological: She is alert and oriented to person, place, and time.  Skin: Skin is warm and dry.  Psychiatric: She has a normal mood and affect. Her behavior is normal.          Assessment & Plan:  Depression-PHQ 9 score of 2. Well controlled.   GERD- Well controlled.    Gluten intolerenace - well controlled as long as sticks with her diet.

## 2014-05-10 ENCOUNTER — Other Ambulatory Visit: Payer: Self-pay | Admitting: Family Medicine

## 2014-05-30 ENCOUNTER — Other Ambulatory Visit: Payer: Self-pay | Admitting: Family Medicine

## 2014-06-03 ENCOUNTER — Other Ambulatory Visit: Payer: Self-pay | Admitting: Family Medicine

## 2014-08-13 ENCOUNTER — Other Ambulatory Visit: Payer: Self-pay | Admitting: Family Medicine

## 2014-08-30 ENCOUNTER — Other Ambulatory Visit: Payer: Self-pay | Admitting: Family Medicine

## 2014-09-21 ENCOUNTER — Telehealth: Payer: Self-pay | Admitting: *Deleted

## 2014-09-21 DIAGNOSIS — Z13220 Encounter for screening for lipoid disorders: Secondary | ICD-10-CM

## 2014-09-21 DIAGNOSIS — Z0000 Encounter for general adult medical examination without abnormal findings: Secondary | ICD-10-CM

## 2014-09-21 NOTE — Telephone Encounter (Signed)
Orders placed and faxed.Carolyn Hendrix  

## 2014-09-23 ENCOUNTER — Ambulatory Visit (INDEPENDENT_AMBULATORY_CARE_PROVIDER_SITE_OTHER): Payer: Medicare Other | Admitting: Family Medicine

## 2014-09-23 ENCOUNTER — Encounter: Payer: Self-pay | Admitting: Family Medicine

## 2014-09-23 VITALS — BP 116/76 | HR 81 | Temp 99.2°F | Ht 66.0 in | Wt 149.0 lb

## 2014-09-23 DIAGNOSIS — Z0000 Encounter for general adult medical examination without abnormal findings: Secondary | ICD-10-CM

## 2014-09-23 DIAGNOSIS — F339 Major depressive disorder, recurrent, unspecified: Secondary | ICD-10-CM

## 2014-09-23 DIAGNOSIS — Z23 Encounter for immunization: Secondary | ICD-10-CM

## 2014-09-23 LAB — CBC WITH DIFFERENTIAL/PLATELET
Basophils Absolute: 0.1 10*3/uL (ref 0.0–0.1)
Basophils Relative: 1 % (ref 0–1)
EOS ABS: 0.1 10*3/uL (ref 0.0–0.7)
EOS PCT: 1 % (ref 0–5)
HEMATOCRIT: 37.5 % (ref 36.0–46.0)
HEMOGLOBIN: 13 g/dL (ref 12.0–15.0)
LYMPHS ABS: 2 10*3/uL (ref 0.7–4.0)
Lymphocytes Relative: 32 % (ref 12–46)
MCH: 31.2 pg (ref 26.0–34.0)
MCHC: 34.7 g/dL (ref 30.0–36.0)
MCV: 89.9 fL (ref 78.0–100.0)
MONO ABS: 0.3 10*3/uL (ref 0.1–1.0)
MONOS PCT: 5 % (ref 3–12)
MPV: 8.9 fL — ABNORMAL LOW (ref 9.4–12.4)
Neutro Abs: 3.7 10*3/uL (ref 1.7–7.7)
Neutrophils Relative %: 61 % (ref 43–77)
Platelets: 320 10*3/uL (ref 150–400)
RBC: 4.17 MIL/uL (ref 3.87–5.11)
RDW: 14 % (ref 11.5–15.5)
WBC: 6.1 10*3/uL (ref 4.0–10.5)

## 2014-09-23 LAB — LIPID PANEL
Cholesterol: 169 mg/dL (ref 0–200)
HDL: 43 mg/dL (ref >39)
LDL Cholesterol: 96 mg/dL (ref 0–99)
Total CHOL/HDL Ratio: 3.9 Ratio
Triglycerides: 150 mg/dL — ABNORMAL HIGH (ref <150)
VLDL: 30 mg/dL (ref 0–40)

## 2014-09-23 LAB — COMPLETE METABOLIC PANEL WITH GFR
ALT: 15 U/L (ref 0–35)
AST: 19 U/L (ref 0–37)
Albumin: 4.4 g/dL (ref 3.5–5.2)
Alkaline Phosphatase: 94 U/L (ref 39–117)
BILIRUBIN TOTAL: 0.5 mg/dL (ref 0.2–1.2)
BUN: 13 mg/dL (ref 6–23)
CO2: 31 meq/L (ref 19–32)
CREATININE: 0.87 mg/dL (ref 0.50–1.10)
Calcium: 9.8 mg/dL (ref 8.4–10.5)
Chloride: 100 mEq/L (ref 96–112)
GFR, Est African American: 78 mL/min
GFR, Est Non African American: 67 mL/min
Glucose, Bld: 81 mg/dL (ref 70–99)
Potassium: 4.2 mEq/L (ref 3.5–5.3)
Sodium: 141 mEq/L (ref 135–145)
Total Protein: 7 g/dL (ref 6.0–8.3)

## 2014-09-23 LAB — FERRITIN: FERRITIN: 66 ng/mL (ref 10–291)

## 2014-09-23 NOTE — Patient Instructions (Signed)
Keep up a regular exercise program and make sure you are eating a healthy diet Try to eat 4 servings of dairy a day, or if you are lactose intolerant take a calcium with vitamin D daily.  Your vaccines are up to date.   

## 2014-09-23 NOTE — Progress Notes (Signed)
Subjective:    Carolyn Hendrix is a 73 y.o. female who presents for Medicare Annual/Subsequent preventive examination.  Preventive Screening-Counseling & Management  Tobacco History  Smoking status  . Former Smoker  . Quit date: 10/23/1980  Smokeless tobacco  . Not on file     Problems Prior to Visit 1. Says has been under a lot of stress.  Says will get down when she feels overwhelmed.    Current Problems (verified) Patient Active Problem List   Diagnosis Date Noted  . Gluten intolerance 09/23/2013  . 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 Left lateral epicondylitis 05/15/2013  . 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 Insomnia 03/18/2013  . OSTEOARTHRITIS, HIP, RIGHT 02/14/2010  . UNSPECIFIED ANEMIA 06/02/2009  . 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 IRRITABLE BOWEL SYNDROME 06/02/2008  . FATIGUE 06/02/2008  . GERD 03/30/2008  . DIVERTICULOSIS OF COLON 03/30/2008  . GROSS HEMATURIA 02/25/2008  . Depressive Disorder, not Elsewhere Classified 02/17/2008  . ENDOMETRIOSIS 02/17/2008  . DISC DISEASE, LUMBAR 02/17/2008  . ISCHEMIC COLITIS, HX OF 02/17/2008    Medications Prior to Visit Current Outpatient Prescriptions on File Prior to Visit  Medication Sig Dispense Refill  . ALPRAZolam (XANAX) 0.5 MG tablet TAKE 1 TAB BY MOUTH 2X A DAY AS NEEDED FOR ANXIETY 60 tablet 1  . Calcium Carbonate-Vitamin D (CALTRATE 600+D) 600-400 MG-UNIT per tablet Take 2 tablets by mouth 2 (two) times daily.      Marland Kitchen. escitalopram (LEXAPRO) 10 MG tablet TAKE 1 TABLET (10 MG TOTAL) BY MOUTH DAILY. 90 tablet 1  . glucosamine-chondroitin 500-400 MG tablet Take 1 tablet by mouth 2 (two) times daily.      Marland Kitchen. HYDROcodone-acetaminophen (NORCO) 10-325 MG per tablet Take 1 tablet by mouth. Take one tablet by mouth five times a day    . ibuprofen (ADVIL,MOTRIN) 800 MG tablet Take 1 tablet (800 mg total) by mouth every 8 (eight) hours as needed for pain. 60 tablet 2  . Omega-3 Fatty Acids  (FISH OIL) 1200 MG CAPS Take by mouth.      Marland Kitchen. omeprazole (PRILOSEC) 20 MG capsule TAKE ONE CAPSULE EVERY DAY 90 capsule 1  . pravastatin (PRAVACHOL) 40 MG tablet TAKE 1 TABLET AT BEDTIME 90 tablet 1  . RESTASIS 0.05 % ophthalmic emulsion     . traZODone (DESYREL) 50 MG tablet Take 50-100 mg by mouth at bedtime.     No current facility-administered medications on file prior to visit.    Current Medications (verified) Current Outpatient Prescriptions  Medication Sig Dispense Refill  . ALPRAZolam (XANAX) 0.5 MG tablet TAKE 1 TAB BY MOUTH 2X A DAY AS NEEDED FOR ANXIETY 60 tablet 1  . Calcium Carbonate-Vitamin D (CALTRATE 600+D) 600-400 MG-UNIT per tablet Take 2 tablets by mouth 2 (two) times daily.      Marland Kitchen. escitalopram (LEXAPRO) 10 MG tablet TAKE 1 TABLET (10 MG TOTAL) BY MOUTH DAILY. 90 tablet 1  . glucosamine-chondroitin 500-400 MG tablet Take 1 tablet by mouth 2 (two) times daily.      Marland Kitchen. HYDROcodone-acetaminophen (NORCO) 10-325 MG per tablet Take 1 tablet by mouth. Take one tablet by mouth five times a day    . ibuprofen (ADVIL,MOTRIN) 800 MG tablet Take 1 tablet (800 mg total) by mouth every 8 (eight) hours as needed for pain. 60 tablet 2  . Omega-3 Fatty Acids (FISH OIL) 1200 MG CAPS Take by mouth.      Marland Kitchen. omeprazole (PRILOSEC) 20 MG capsule TAKE ONE  CAPSULE EVERY DAY 90 capsule 1  . pravastatin (PRAVACHOL) 40 MG tablet TAKE 1 TABLET AT BEDTIME 90 tablet 1  . RESTASIS 0.05 % ophthalmic emulsion     . traZODone (DESYREL) 50 MG tablet Take 50-100 mg by mouth at bedtime.     No current facility-administered medications for this visit.     Allergies (verified) Morphine   PAST HISTORY  Family History Family History  Problem Relation Age of Onset  . Alzheimer's disease Mother   . Other Father     ACS? - 8280's  . Ataxia Sister     Social History History  Substance Use Topics  . Smoking status: Former Smoker    Quit date: 10/23/1980  . Smokeless tobacco: Not on file  . Alcohol  Use: No     Are there smokers in your home (other than you)? No  Risk Factors Current exercise habits: yoga and chair exercises  Dietary issues discussed: gluten intolerance   Cardiac risk factors: advanced age (older than 2055 for men, 6665 for women).  Depression Screen (Note: if answer to either of the following is "Yes", a more complete depression screening is indicated)   Over the past two weeks, have you felt down, depressed or hopeless? Yes  Over the past two weeks, have you felt little interest or pleasure in doing things? Yes  Have you lost interest or pleasure in daily life? No  Do you often feel hopeless? No  Do you cry easily over simple problems? No  Activities of Daily Living In your present state of health, do you have any difficulty performing the following activities?:  Driving? No Managing money?  No Feeding yourself? No Getting from bed to chair? No  Climbing a flight of stairs? No Preparing food and eating?: No Bathing or showering? No Getting dressed: No Getting to the toilet? No Using the toilet:No Moving around from place to place: No In the past year have you fallen or had a near fall?:No   Are you sexually active?  No  Do you have more than one partner?  No  Hearing Difficulties: No Do you often ask people to speak up or repeat themselves? No Do you experience ringing or noises in your ears? No Do you have difficulty understanding soft or whispered voices? No   Do you feel that you have a problem with memory? No  Do you often misplace items? No  Do you feel safe at home?  Yes  Cognitive Testing  Alert? Yes  Normal Appearance?Yes  Oriented to person? Yes  Place? Yes   Time? Yes  Recall of three objects?  Yes  Can perform simple calculations? Yes  Displays appropriate judgment?Yes  Can read the correct time from a watch face?Yes   6 CIT score of 0/28 ( normal)     Advanced Directives have been discussed with the patient? Yes  List the  Names of Other Physician/Practitioners you currently use: 1.   Dr. Ruby ColaPittaway  - Oncologist 2. Dr. Percell Bostonananis - Pain managment  Indicate any recent Medical Services you may have received from other than [facility] providers in the past year (date may be approximate).  Immunization History  Administered Date(s) Administered  . Influenza Split 08/30/2011, 08/07/2012  . Influenza Whole 08/19/2008, 08/03/2010  . Influenza, Seasonal, Injecte, Preservative Fre 07/22/2013  . Pneumococcal Polysaccharide-23 06/02/2009  . Td 05/16/2006  . Zoster 05/30/2011    Screening Tests Health Maintenance  Topic Date Due  . INFLUENZA VACCINE  05/23/2014  .  MAMMOGRAM  12/03/2014  . COLONOSCOPY  05/19/2015  . TETANUS/TDAP  05/16/2016  . DEXA SCAN  Completed  . PNEUMOCOCCAL POLYSACCHARIDE VACCINE AGE 53 AND OVER  Completed  . ZOSTAVAX  Completed    All answers were reviewed with the patient and necessary referrals were made:  Raidyn Breiner, MD   09/23/2014   History reviewed: allergies, current medications, past family history, past medical history, past social history, past surgical history and problem list  Review of Systems A comprehensive review of systems was negative.    Objective:     Vision by Snellen chart: she does get a yearly eye exam here and [place].  There is no weight on file to calculate BMI. There were no vitals taken for this visit.  There were no vitals taken for this visit. General appearance: alert, cooperative and appears stated age Head: Normocephalic, without obvious abnormality, atraumatic Eyes: conj clear, EOMi, PEERAL Ears: normal TM's and external ear canals both ears Nose: Nares normal. Septum midline. Mucosa normal. No drainage or sinus tenderness. Throat: lips, mucosa, and tongue normal; teeth and gums normal Neck: no adenopathy, no carotid bruit, no JVD, supple, symmetrical, trachea midline and thyroid not enlarged, symmetric, no  tenderness/mass/nodules Back: symmetric, no curvature. ROM normal. No CVA tenderness. Lungs: clear to auscultation bilaterally Breasts: normal appearance, no masses or tenderness Heart: regular rate and rhythm, S1, S2 normal, no murmur, click, rub or gallop Abdomen: soft, non-tender; bowel sounds normal; no masses,  no organomegaly Extremities: extremities normal, atraumatic, no cyanosis or edema Pulses: 2+ and symmetric Skin: Skin color, texture, turgor normal. No rashes or lesions Lymph nodes: Cervical, supraclavicular, and axillary nodes normal. Neurologic: Alert and oriented X 3, normal strength and tone. Normal symmetric reflexes. Normal coordination and gait     Assessment:     Annual Medicare Wellness Exam.      Plan:     During the course of the visit the patient was educated and counseled about appropriate screening and preventive services including:    Pneumococcal vaccine   Influenza vaccine   Has mammograme schedule for latter this year  Depression - Discussed weaning the xanax. Discussed inc risk fo dementia and falls on this medication.  She agrees to try. F/U in 6 months.  Also recommended a trial of light therapy since she does tend to feel little bit more down in the wintertime.  Diet review for nutrition referral? Yes ____  Not Indicated _X_   Patient Instructions (the written plan) was given to the patient.  Medicare Attestation I have personally reviewed: The patient's medical and social history Their use of alcohol, tobacco or illicit drugs Their current medications and supplements The patient's functional ability including ADLs,fall risks, home safety risks, cognitive, and hearing and visual impairment Diet and physical activities Evidence for depression or mood disorders  The patient's weight, height, BMI, and visual acuity have been recorded in the chart.  I have made referrals, counseling, and provided education to the patient based on review of  the above and I have provided the patient with a written personalized care plan for preventive services.     Orey Moure, MD   09/23/2014

## 2014-10-13 ENCOUNTER — Encounter: Payer: Self-pay | Admitting: Family Medicine

## 2014-11-05 ENCOUNTER — Other Ambulatory Visit: Payer: Self-pay | Admitting: Family Medicine

## 2014-11-10 ENCOUNTER — Ambulatory Visit (INDEPENDENT_AMBULATORY_CARE_PROVIDER_SITE_OTHER): Payer: 59 | Admitting: Family Medicine

## 2014-11-10 ENCOUNTER — Encounter: Payer: Self-pay | Admitting: Family Medicine

## 2014-11-10 VITALS — BP 116/76 | HR 102 | Ht 69.0 in | Wt 149.0 lb

## 2014-11-10 DIAGNOSIS — M7712 Lateral epicondylitis, left elbow: Secondary | ICD-10-CM

## 2014-11-10 DIAGNOSIS — F339 Major depressive disorder, recurrent, unspecified: Secondary | ICD-10-CM

## 2014-11-10 DIAGNOSIS — F411 Generalized anxiety disorder: Secondary | ICD-10-CM

## 2014-11-10 MED ORDER — DULOXETINE HCL 30 MG PO CPEP: 30.0000 mg | ORAL_CAPSULE | Freq: Every day | ORAL | Status: DC

## 2014-11-10 NOTE — Patient Instructions (Signed)
Cut lexpro tab in half.  Take half a tab a day for one week, then half tab every other day for a week and then stop.   When you get to the tab every other day then you can start the cymbalta.

## 2014-11-10 NOTE — Progress Notes (Signed)
Subjective:    Patient ID: Carolyn Hendrix, female    DOB: Dec 28, 1940, 74 y.o.   MRN: [id number]  HPI F/U major depression - she is doing fair over all. She had a very stressful time over the holidays. They had a lot of accompanying guest and she found herself stressed and anxious. She does feel like it's getting a little bit better. The family just put the holiday decorations. She complains of feeling nervous and on edge more than half the days and difficulty relaxing more than half the days. She also feels quite irritable more than half the days and feels little pleasure interest in doing things several days of the week. She's currently on Lexapro. Also over the holiday she exacerbated her low back. This is been a recurring problem. She had surgery years ago and follows with pain management.  Left lateral epidondyltis- last injection was in 05/2014 with Dr. Karie Schwalbe.  She moved in the fall and did a lot of lifting and decorating around the [place] and says the area has been really sore and tender.  Her lat injection was over a year ago and she would like to have another one today. She does wear her elbow brace at times. She is getting to the point where it's tender with any position. No redness fever or swelling.    Review of Systems     Objective:   Physical Exam  Constitutional: She is oriented to person, place, and time. She appears well-developed and well-nourished.  HENT:  Head: Normocephalic and atraumatic.  Musculoskeletal:  Left elbow with normal range of motion and strength. She has pain with supination against resistance. And tenderness over the lateral epicondyle.  Neurological: She is alert and oriented to person, place, and time. No cranial nerve deficit. Coordination normal.  Skin: Skin is warm and dry.  Psychiatric: She has a normal mood and affect. Her behavior is normal.          Assessment & Plan:  Depression/anxiety-PHQ 9 score of 8 today and Gad 7 score of 9. She rates  her symptoms is somewhat difficult. We discussed options including switching to Cymbalta which could potentially help with her mood at least as well as Lexapro and maybe even help with her chronic muscle skeletal back pain. She is willing to try this. We'll wean the Lexapro and start Cymbalta 30 mg. I would like to see her back in a month to make sure that she's doing well. Next  Left lateral epicondylitis-discussed options.  she would really like to have another injection today.  She is aware of risk os skin discoloration and atropy recommend wear her brace for the next 2 days. Avoid any excessive cleaning scrubbing or range of motion stretches and exercises. Avoid heavy lifting especially away from the body. Recommend that she ice for about 5-10 minutes when she gets home and then again this evening. Call if any problems or concerns  Elbow Injection Procedure Note  Pre-operative Diagnosis: left lateral epidondylitis  Post-operative Diagnosis: same  Indications: pain  Anesthesia: Lidocaine 1% without epinephrine without added sodium bicarbonate  Procedure Details   Verbal consent was obtained for the procedure. The point of maximum tenderness was identified and marked over the lateral epicondyles. The skin prepped with chlorhexidine and cold spray was used.  A needle was advanced into the left lateral epicondyles and withdrawn just slightly. The skin was raised and tented before injection. 1 mL 1% lidocaine without epinephrine with 1 mL of 40  mg Kenalog injected. Patient tolerated well. Band-Aid placed over injection site.   Complications:  None; patient tolerated the procedure well.

## 2014-11-18 ENCOUNTER — Other Ambulatory Visit: Payer: Self-pay | Admitting: Family Medicine

## 2014-11-24 ENCOUNTER — Encounter: Payer: Self-pay | Admitting: Family Medicine

## 2014-11-24 ENCOUNTER — Ambulatory Visit (INDEPENDENT_AMBULATORY_CARE_PROVIDER_SITE_OTHER): Payer: 59 | Admitting: Family Medicine

## 2014-11-24 VITALS — BP 126/70 | HR 95 | Ht 69.0 in | Wt 149.0 lb

## 2014-11-24 DIAGNOSIS — M545 Low back pain, unspecified: Secondary | ICD-10-CM

## 2014-11-24 DIAGNOSIS — G8929 Other chronic pain: Secondary | ICD-10-CM

## 2014-11-24 NOTE — Progress Notes (Signed)
   Subjective:    Patient ID: Kathlene NovemberSandra R Zeien, female    DOB: 1941-04-23, 74 y.o.   MRN: [id number]  HPI Chronic Back Pain on the right side. Says can't lay on the right hip.  Radiates into the groin crease and buttock.    Has had SI joint injections in the past. Helps some but on ly for a few weeks.  Pain is 6-7/10. Has her TENS unit on. Has tried PT.   Says her pain started after her hysterectomy for endometriosis. Had hemilaminectomy of L5 on right with lysis of vascular adhension, explortation of L5 and S1 nerve roots on 11/18/91.  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     She is interested in a referral to Dr. Beckie [facility] Hnilica at [facility], neurosurgery.      Review of Systems     Objective:   Physical Exam  Constitutional: She is oriented to person, place, and time. She appears well-developed and well-nourished.  HENT:  Head: Normocephalic and atraumatic.  Neurological: She is alert and oriented to person, place, and time.  Skin: Skin is warm and dry.  Psychiatric: She has a normal mood and affect. Her behavior is normal.          Assessment & Plan:  Chronic right low back - Failed PT, injections, etc.  Will get CT. Will then refer to Neurosurgeon.

## 2014-11-25 ENCOUNTER — Encounter: Payer: Self-pay | Admitting: Family Medicine

## 2014-11-27 ENCOUNTER — Telehealth: Payer: Self-pay

## 2014-11-27 NOTE — Telephone Encounter (Signed)
PA required for CT of lower back without - Approved [id number]

## 2014-11-30 ENCOUNTER — Ambulatory Visit (INDEPENDENT_AMBULATORY_CARE_PROVIDER_SITE_OTHER): Payer: Medicare Other

## 2014-11-30 DIAGNOSIS — M5136 Other intervertebral disc degeneration, lumbar region: Secondary | ICD-10-CM

## 2014-11-30 DIAGNOSIS — M545 Low back pain: Secondary | ICD-10-CM

## 2014-11-30 DIAGNOSIS — M5126 Other intervertebral disc displacement, lumbar region: Secondary | ICD-10-CM

## 2014-11-30 DIAGNOSIS — G8929 Other chronic pain: Secondary | ICD-10-CM

## 2014-11-30 DIAGNOSIS — Z981 Arthrodesis status: Secondary | ICD-10-CM

## 2014-11-30 DIAGNOSIS — M4806 Spinal stenosis, lumbar region: Secondary | ICD-10-CM

## 2014-11-30 DIAGNOSIS — M4310 Spondylolisthesis, site unspecified: Secondary | ICD-10-CM

## 2014-12-10 ENCOUNTER — Other Ambulatory Visit: Payer: Self-pay | Admitting: *Deleted

## 2014-12-10 ENCOUNTER — Telehealth: Payer: Self-pay | Admitting: *Deleted

## 2014-12-10 DIAGNOSIS — M5136 Other intervertebral disc degeneration, lumbar region: Secondary | ICD-10-CM

## 2014-12-10 DIAGNOSIS — G8929 Other chronic pain: Secondary | ICD-10-CM

## 2014-12-10 DIAGNOSIS — M545 Low back pain: Principal | ICD-10-CM

## 2014-12-10 DIAGNOSIS — M4310 Spondylolisthesis, site unspecified: Secondary | ICD-10-CM

## 2014-12-10 NOTE — Telephone Encounter (Signed)
Pt called to f/u about referral. Pt informed that her referral was placed on 2/11.Loralee PacasBarkley, Atthew Coutant RowenaLynetta

## 2014-12-15 ENCOUNTER — Other Ambulatory Visit: Payer: Self-pay | Admitting: Family Medicine

## 2014-12-21 ENCOUNTER — Telehealth: Payer: Self-pay | Admitting: *Deleted

## 2014-12-21 NOTE — Telephone Encounter (Signed)
Pt called and reported that the cymbalta caused her to have diarrhea and she d/c'd and started taking lexapro.Loralee PacasBarkley, Gianni Fuchs HilltopLynetta

## 2015-01-25 ENCOUNTER — Other Ambulatory Visit: Payer: Self-pay | Admitting: Family Medicine

## 2015-02-25 ENCOUNTER — Other Ambulatory Visit: Payer: Self-pay | Admitting: Family Medicine

## 2015-03-21 ENCOUNTER — Other Ambulatory Visit: Payer: Self-pay | Admitting: Family Medicine

## 2015-03-25 ENCOUNTER — Encounter: Payer: Self-pay | Admitting: Family Medicine

## 2015-03-25 ENCOUNTER — Ambulatory Visit (INDEPENDENT_AMBULATORY_CARE_PROVIDER_SITE_OTHER): Payer: 59 | Admitting: Family Medicine

## 2015-03-25 VITALS — BP 121/73 | HR 83 | Ht 69.0 in | Wt 148.0 lb

## 2015-03-25 DIAGNOSIS — F411 Generalized anxiety disorder: Secondary | ICD-10-CM

## 2015-03-25 DIAGNOSIS — G4700 Insomnia, unspecified: Secondary | ICD-10-CM

## 2015-03-25 DIAGNOSIS — F339 Major depressive disorder, recurrent, unspecified: Secondary | ICD-10-CM | POA: Diagnosis not present

## 2015-03-25 MED ORDER — TRAZODONE HCL 100 MG PO TABS: 100.0000 mg | ORAL_TABLET | Freq: Every day | ORAL | Status: DC

## 2015-03-25 NOTE — Progress Notes (Signed)
   Subjective:    Patient ID: Carolyn NovemberSandra R Fedie, female    DOB: 02-03-1941, 74 y.o.   MRN: [id number]  HPI Follow-up depression/anxiety-we are tried to switch her to Cymbalta about 4 months ago. Unfortunately she experienced diarrhea with it so she went back to the Lexapro. She is here for follow-up today. No signficant. Mood has been better.   Insomnia-she currently uses trazodone.  Chronic low back pain - She is now going to [facility].  Now seeing Dr. Idelle CrouchKupril.  Did see the neurosurgeon and they recommended pain management.    Review of Systems     Objective:   Physical Exam  Constitutional: She is oriented to person, place, and time. She appears well-developed and well-nourished.  HENT:  Head: Normocephalic and atraumatic.  Cardiovascular: Normal rate, regular rhythm and normal heart sounds.   Pulmonary/Chest: Effort normal and breath sounds normal.  Neurological: She is alert and oriented to person, place, and time.  Skin: Skin is warm and dry.  Psychiatric: She has a normal mood and affect. Her behavior is normal.          Assessment & Plan:  Depression/Anxiety - PHQ 9 score of 3 and Gad 7 score of 1. Overall she is doing fantastic. We will continue the Lexapro. She says she feels very positive after meeting her new pain management doctor and feels very hopeful that he might be able to provide some pain relief for her. Follow-up in 6 months.  Insomnia - will change to 100mg  tab so doesn't have to take 2 of the 50mg  tabs.    Chronic low back pain - She says she feels very positive after meeting her new pain management doctor and feels very hopeful that he might be able to provide some pain relief for her.

## 2015-04-29 ENCOUNTER — Other Ambulatory Visit: Payer: Self-pay | Admitting: Sports Medicine

## 2015-05-02 ENCOUNTER — Other Ambulatory Visit: Payer: Self-pay | Admitting: Family Medicine

## 2015-05-03 NOTE — Telephone Encounter (Signed)
Called pharmacy, Rx was received by pharmacy. No error. 

## 2015-06-11 DIAGNOSIS — M9903 Segmental and somatic dysfunction of lumbar region: Secondary | ICD-10-CM | POA: Insufficient documentation

## 2015-06-16 ENCOUNTER — Telehealth: Payer: Self-pay

## 2015-06-16 NOTE — Telephone Encounter (Signed)
Carolyn Hendrix complains of diarrhea on Saturday Sunday Monday and Tuesday. Diarrhea has resolved. This morning she has a headache and some nausea. I advised her to increase her fluid intake with Gatorade and go on a BRAT diet. Also if her headache does not resolve in a few hours she should call back.

## 2015-06-16 NOTE — Telephone Encounter (Signed)
I agree with below

## 2015-06-19 ENCOUNTER — Other Ambulatory Visit: Payer: Self-pay | Admitting: Family Medicine

## 2015-06-25 ENCOUNTER — Ambulatory Visit (INDEPENDENT_AMBULATORY_CARE_PROVIDER_SITE_OTHER): Payer: 59 | Admitting: Family Medicine

## 2015-06-25 ENCOUNTER — Encounter: Payer: Self-pay | Admitting: Family Medicine

## 2015-06-25 VITALS — BP 114/70 | HR 76 | Temp 98.1°F | Ht 69.0 in | Wt 148.0 lb

## 2015-06-25 DIAGNOSIS — R519 Headache, unspecified: Secondary | ICD-10-CM

## 2015-06-25 DIAGNOSIS — R5383 Other fatigue: Secondary | ICD-10-CM | POA: Diagnosis not present

## 2015-06-25 DIAGNOSIS — Z8639 Personal history of other endocrine, nutritional and metabolic disease: Secondary | ICD-10-CM | POA: Diagnosis not present

## 2015-06-25 DIAGNOSIS — Z23 Encounter for immunization: Secondary | ICD-10-CM | POA: Diagnosis not present

## 2015-06-25 DIAGNOSIS — R51 Headache: Secondary | ICD-10-CM

## 2015-06-25 NOTE — Progress Notes (Signed)
   Subjective:    Patient ID: Carolyn Hendrix, female    DOB: September 12, 1941, 74 y.o.   MRN: [id number]  HPI Says went to the beach in July and when she got back she got a bad stomach bug  (5 days of diarrhea). Afterwards says started to develop a bad HA. Says she doesn't typically get HA so that was unusual for her.   Then noticed that her temples where sore more recently. Has been more fatigue.  She also had hurt her back at the beach too from twisting her umbrella into the sand. Saw her massage therapist.   Review of Systems  No vision changes.     Objective:   Physical Exam  Constitutional: She is oriented to person, place, and time. She appears well-developed and well-nourished.  HENT:  Head: Normocephalic and atraumatic.  Right Ear: External ear normal.  Left Ear: External ear normal.  Nose: Nose normal.  Mouth/Throat: Oropharynx is clear and moist.  TMs and canals are clear. Nontender over the temples.   Eyes: Conjunctivae and EOM are normal. Pupils are equal, round, and reactive to light.  Neck: Neck supple. No thyromegaly present.  Cardiovascular: Normal rate, regular rhythm and normal heart sounds.   No carotid bruits.    Pulmonary/Chest: Effort normal and breath sounds normal. She has no wheezes.  Lymphadenopathy:    She has no cervical adenopathy.  Neurological: She is alert and oriented to person, place, and time.  Skin: Skin is warm and dry.  Psychiatric: She has a normal mood and affect.          Assessment & Plan:  HA with temples tenderness - consider temperal arteritis. We'll check a sedimentation rate and a CRP. Though her symptoms are bilateral which make this less typical. I think it's worth a consideration. Her headache has resolved at this time may have been secondary to some mild dehydration right after the acute viral illness. Her diarrhea and vomiting have resolved as well.   Fatigue-I think this may be secondary to some increased stressors in her life  recently. Her husband has been in and out of the hospital and she's just felt overwhelmed. Though I think it may be worth evaluating for anemia. She did report a prior history of low iron. She weren't there's if her B12 could be low. The she denies any special diet such as vegetarian. We'll also check thyroid level.

## 2015-06-26 LAB — CBC WITH DIFFERENTIAL/PLATELET
BASOS PCT: 1 % (ref 0–1)
Basophils Absolute: 0.1 10*3/uL (ref 0.0–0.1)
EOS ABS: 0.2 10*3/uL (ref 0.0–0.7)
Eosinophils Relative: 2 % (ref 0–5)
HCT: 37 % (ref 36.0–46.0)
Hemoglobin: 12.2 g/dL (ref 12.0–15.0)
Lymphocytes Relative: 24 % (ref 12–46)
Lymphs Abs: 2.1 10*3/uL (ref 0.7–4.0)
MCH: 30.7 pg (ref 26.0–34.0)
MCHC: 33 g/dL (ref 30.0–36.0)
MCV: 93 fL (ref 78.0–100.0)
MONO ABS: 0.4 10*3/uL (ref 0.1–1.0)
MONOS PCT: 5 % (ref 3–12)
MPV: 9 fL (ref 8.6–12.4)
NEUTROS PCT: 68 % (ref 43–77)
Neutro Abs: 5.8 10*3/uL (ref 1.7–7.7)
PLATELETS: 342 10*3/uL (ref 150–400)
RBC: 3.98 MIL/uL (ref 3.87–5.11)
RDW: 13.5 % (ref 11.5–15.5)
WBC: 8.6 10*3/uL (ref 4.0–10.5)

## 2015-06-26 LAB — TSH: TSH: 0.718 u[IU]/mL (ref 0.350–4.500)

## 2015-06-26 LAB — SEDIMENTATION RATE: Sed Rate: 10 mm/hr (ref 0–30)

## 2015-06-26 LAB — C-REACTIVE PROTEIN: CRP: <0.5 mg/dL (ref <0.60)

## 2015-06-26 LAB — VITAMIN B12: VITAMIN B 12: 1121 pg/mL — AB (ref 211–911)

## 2015-08-04 ENCOUNTER — Other Ambulatory Visit: Payer: Self-pay | Admitting: Family Medicine

## 2015-08-20 ENCOUNTER — Other Ambulatory Visit: Payer: Self-pay | Admitting: Family Medicine

## 2015-09-08 ENCOUNTER — Other Ambulatory Visit: Payer: Self-pay | Admitting: Family Medicine

## 2015-09-13 ENCOUNTER — Other Ambulatory Visit: Payer: Self-pay | Admitting: Family Medicine

## 2015-09-20 ENCOUNTER — Telehealth: Payer: Self-pay | Admitting: Family Medicine

## 2015-09-20 DIAGNOSIS — R5383 Other fatigue: Secondary | ICD-10-CM

## 2015-09-20 DIAGNOSIS — D649 Anemia, unspecified: Secondary | ICD-10-CM

## 2015-09-20 NOTE — Telephone Encounter (Signed)
Pt called and would like to go ahead and get her labs done for her physical she has on Monday . Thanks

## 2015-09-20 NOTE — Telephone Encounter (Signed)
Ok for CMP, lipids, b12, Please place order

## 2015-09-20 NOTE — Telephone Encounter (Signed)
Labs sent and patient notified.

## 2015-09-21 LAB — COMPLETE METABOLIC PANEL WITH GFR
ALBUMIN: 4 g/dL (ref 3.6–5.1)
ALT: 24 U/L (ref 6–29)
AST: 25 U/L (ref 10–35)
Alkaline Phosphatase: 90 U/L (ref 33–130)
BILIRUBIN TOTAL: 0.4 mg/dL (ref 0.2–1.2)
BUN: 12 mg/dL (ref 7–25)
CO2: 31 mmol/L (ref 20–31)
CREATININE: 0.82 mg/dL (ref 0.60–0.93)
Calcium: 9.2 mg/dL (ref 8.6–10.4)
Chloride: 100 mmol/L (ref 98–110)
GFR, EST AFRICAN AMERICAN: 83 mL/min (ref >=60)
GFR, Est Non African American: 72 mL/min (ref >=60)
Glucose, Bld: 83 mg/dL (ref 65–99)
Potassium: 4.3 mmol/L (ref 3.5–5.3)
Sodium: 140 mmol/L (ref 135–146)
TOTAL PROTEIN: 6.5 g/dL (ref 6.1–8.1)

## 2015-09-21 LAB — VITAMIN B12: VITAMIN B 12: 800 pg/mL (ref 211–911)

## 2015-09-21 LAB — LIPID PANEL
Cholesterol: 165 mg/dL (ref 125–200)
HDL: 39 mg/dL — ABNORMAL LOW (ref >=46)
LDL CALC: 93 mg/dL (ref <130)
TRIGLYCERIDES: 163 mg/dL — AB (ref <150)
Total CHOL/HDL Ratio: 4.2 Ratio (ref <=5.0)
VLDL: 33 mg/dL — ABNORMAL HIGH (ref <30)

## 2015-09-27 ENCOUNTER — Ambulatory Visit (INDEPENDENT_AMBULATORY_CARE_PROVIDER_SITE_OTHER): Payer: Medicare Other | Admitting: Family Medicine

## 2015-09-27 ENCOUNTER — Encounter: Payer: Self-pay | Admitting: Family Medicine

## 2015-09-27 VITALS — BP 130/72 | HR 94 | Temp 98.4°F | Resp 18 | Wt 147.2 lb

## 2015-09-27 DIAGNOSIS — Z0000 Encounter for general adult medical examination without abnormal findings: Secondary | ICD-10-CM

## 2015-09-27 DIAGNOSIS — Z1211 Encounter for screening for malignant neoplasm of colon: Secondary | ICD-10-CM

## 2015-09-27 MED ORDER — ESCITALOPRAM OXALATE 10 MG PO TABS: ORAL_TABLET | ORAL | Status: DC

## 2015-09-27 NOTE — Patient Instructions (Signed)
Keep up a regular exercise program and make sure you are eating a healthy diet Try to eat 4 servings of dairy a day, or if you are lactose intolerant take a calcium with vitamin D daily.  Your vaccines are up to date.   

## 2015-09-27 NOTE — Progress Notes (Signed)
Subjective:    Carolyn Hendrix is a 74 y.o. female who presents for Medicare Annual/Subsequent preventive examination.  Preventive Screening-Counseling & Management  Tobacco History  Smoking status  . Former Smoker  . Quit date: 10/23/1980  Smokeless tobacco  . Not on file     Problems Prior to Visit 1.   Current Problems (verified) Patient Active Problem List   Diagnosis Date Noted  . Gluten intolerance 09/23/2013  . 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 Left lateral epicondylitis 05/15/2013  . 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 Insomnia 03/18/2013  . OSTEOARTHRITIS, HIP, RIGHT 02/14/2010  . UNSPECIFIED ANEMIA 06/02/2009  . 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 IRRITABLE BOWEL SYNDROME 06/02/2008  . Fatigue 06/02/2008  . GERD 03/30/2008  . DIVERTICULOSIS OF COLON 03/30/2008  . GROSS HEMATURIA 02/25/2008  . Major depression, recurrent, chronic (HCC) 02/17/2008  . ENDOMETRIOSIS 02/17/2008  . DISC DISEASE, LUMBAR 02/17/2008  . ISCHEMIC COLITIS, HX OF 02/17/2008    Medications Prior to Visit Current Outpatient Prescriptions on File Prior to Visit  Medication Sig Dispense Refill  . ALPRAZolam (XANAX) 0.5 MG tablet TAKE 1 TABLET BY MOUTH TWICE A DAY AS NEEDED FOR ANXIETY 60 tablet 0  . escitalopram (LEXAPRO) 10 MG tablet TAKE 1 TABLET (10 MG TOTAL) BY MOUTH DAILY. 90 tablet 0  . glucosamine-chondroitin 500-400 MG tablet Take 1 tablet by mouth 2 (two) times daily.      Marland Kitchen HYDROcodone-acetaminophen (NORCO) 10-325 MG per tablet Take 1 tablet by mouth. Take one tablet by mouth five times a day    . Omega-3 Fatty Acids (FISH OIL) 1200 MG CAPS Take by mouth.      Marland Kitchen omeprazole (PRILOSEC) 20 MG capsule TAKE ONE CAPSULE EVERY DAY 90 capsule 1  . pravastatin (PRAVACHOL) 40 MG tablet Take 1 tablet (40 mg total) by mouth at bedtime. Labs due at the end of November 90 tablet 0  . traZODone (DESYREL) 100 MG tablet TAKE 1 TABLET (100 MG TOTAL) BY MOUTH AT BEDTIME.  90 tablet 1   No current facility-administered medications on file prior to visit.    Current Medications (verified) Current Outpatient Prescriptions  Medication Sig Dispense Refill  . ALPRAZolam (XANAX) 0.5 MG tablet TAKE 1 TABLET BY MOUTH TWICE A DAY AS NEEDED FOR ANXIETY 60 tablet 0  . escitalopram (LEXAPRO) 10 MG tablet TAKE 1 TABLET (10 MG TOTAL) BY MOUTH DAILY. 90 tablet 0  . glucosamine-chondroitin 500-400 MG tablet Take 1 tablet by mouth 2 (two) times daily.      Marland Kitchen HYDROcodone-acetaminophen (NORCO) 10-325 MG per tablet Take 1 tablet by mouth. Take one tablet by mouth five times a day    . Omega-3 Fatty Acids (FISH OIL) 1200 MG CAPS Take by mouth.      Marland Kitchen omeprazole (PRILOSEC) 20 MG capsule TAKE ONE CAPSULE EVERY DAY 90 capsule 1  . pravastatin (PRAVACHOL) 40 MG tablet Take 1 tablet (40 mg total) by mouth at bedtime. Labs due at the end of November 90 tablet 0  . traZODone (DESYREL) 100 MG tablet TAKE 1 TABLET (100 MG TOTAL) BY MOUTH AT BEDTIME. 90 tablet 1   No current facility-administered medications for this visit.     Allergies (verified) Cymbalta and Morphine   PAST HISTORY  Family History Family History  Problem Relation Age of Onset  . Alzheimer's disease Mother   . Other Father     ACS? - 59's  . Ataxia Sister     Social  History Social History  Substance Use Topics  . Smoking status: Former Smoker    Quit date: 10/23/1980  . Smokeless tobacco: Not on file  . Alcohol Use: No     Are there smokers in your home (other than you)? No  Risk Factors Current exercise habits: just had pain stimulator placed so not exercising yet.   Dietary issues discussed: None, has cut back back on sweets.   Cardiac risk factors: advanced age (older than 32 for men, 70 for women) and sedentary lifestyle.  Depression Screen (Note: if answer to either of the following is "Yes", a more complete depression screening is indicated)   Over the past two weeks, have you felt down,  depressed or hopeless? No  Over the past two weeks, have you felt little interest or pleasure in doing things? No  Have you lost interest or pleasure in daily life? No  Do you often feel hopeless? No  Do you cry easily over simple problems? No  Activities of Daily Living In your present state of health, do you have any difficulty performing the following activities?:  Driving? No Managing money?  No Feeding yourself? No Getting from bed to chair? No   Climbing a flight of stairs? No Preparing food and eating?: No Bathing or showering? No Getting dressed: No Getting to the toilet? No Using the toilet:No Moving around from place to place: No In the past year have you fallen or had a near fall?:No     Hearing Difficulties: No Do you often ask people to speak up or repeat themselves? No Do you experience ringing or noises in your ears? No Do you have difficulty understanding soft or whispered voices? No   Do you feel that you have a problem with memory? No  Do you often misplace items? No  Do you feel safe at home?  Yes  Cognitive Testing  Alert? Yes  Normal Appearance?Yes  Oriented to person? Yes  Place? Yes   Time? Yes  Recall of three objects?  Yes  Can perform simple calculations? Yes  Displays appropriate judgment?Yes  Can read the correct time from a watch face?Yes     6 CIT score of 0/28 - normal    Advanced Directives have been discussed with the patient? Yes  List the Names of Other Physician/Practitioners you currently use: 1.  Pain managmement   Indicate any recent Medical Services you may have received from other than [facility] providers in the past year (date may be approximate).  Immunization History  Administered Date(s) Administered  . Influenza Split 08/30/2011, 08/07/2012  . Influenza Whole 08/19/2008, 08/03/2010  . Influenza, Seasonal, Injecte, Preservative Fre 07/22/2013  . Influenza,inj,Quad PF,36+ Mos 09/23/2014, 06/25/2015  . Pneumococcal  Conjugate-13 09/23/2014  . Pneumococcal Polysaccharide-23 06/02/2009  . Td 05/16/2006  . Zoster 05/30/2011    Screening Tests Health Maintenance  Topic Date Due  . COLONOSCOPY  05/19/2015  . TETANUS/TDAP  05/16/2016  . INFLUENZA VACCINE  05/23/2016  . MAMMOGRAM  10/02/2016  . DEXA SCAN  Completed  . ZOSTAVAX  Completed  . PNA vac Low Risk Adult  Completed    All answers were reviewed with the patient and necessary referrals were made:  Aevah Stansbery, MD   09/27/2015   History reviewed: allergies, current medications, past family history, past medical history, past social history, past surgical history and problem list  Review of Systems Pertinent items noted in HPI and remainder of comprehensive ROS otherwise negative.    Objective:  Vision by Snellen chart: Eye exam UTD.    Body mass index is 21.73 kg/(m^2). BP 130/72 mmHg  Pulse 94  Temp(Src) 98.4 F (36.9 C)  Resp 18  Wt 147 lb 3.2 oz (66.769 kg)  SpO2 98%  BP 130/72 mmHg  Pulse 94  Temp(Src) 98.4 F (36.9 C)  Resp 18  Wt 147 lb 3.2 oz (66.769 kg)  SpO2 98% General appearance: alert, cooperative and appears stated age Head: Normocephalic, without obvious abnormality, atraumatic Eyes: conj clear, EOMi, PEERLA Ears: normal TM's and external ear canals both ears Nose: Nares normal. Septum midline. Mucosa normal. No drainage or sinus tenderness. Throat: lips, mucosa, and tongue normal; teeth and gums normal Neck: no adenopathy, no carotid bruit, no JVD, supple, symmetrical, trachea midline and thyroid not enlarged, symmetric, no tenderness/mass/nodules Back: symmetric, no curvature. ROM normal. No CVA tenderness. Lungs: clear to auscultation bilaterally Breasts: normal appearance, no masses or tenderness Heart: regular rate and rhythm, S1, S2 normal, no murmur, click, rub or gallop Abdomen: soft, non-tender; bowel sounds normal; no masses,  no organomegaly Extremities: extremities normal, atraumatic,  no cyanosis or edema Pulses: 2+ and symmetric Skin: Skin color, texture, turgor normal. No rashes or lesions Lymph nodes: Cervical, supraclavicular, and axillary nodes normal. Neurologic: Alert and oriented X 3, normal strength and tone. Normal symmetric reflexes. Normal coordination and gait     Assessment:     Medicare Wellness Exam       Plan:     During the course of the visit the patient was educated and counseled about appropriate screening and preventive services including:    Screening mammography - scheduled for this month  Labs UTD.   Refer for screening colonoscopy.    Depresion - doing well. REfill lexapro. F/U in 6 mo.   Diet review for nutrition referral? Yes ____  Not Indicated __X__   Patient Instructions (the written plan) was given to the patient.  Medicare Attestation I have personally reviewed: The patient's medical and social history Their use of alcohol, tobacco or illicit drugs Their current medications and supplements The patient's functional ability including ADLs,fall risks, home safety risks, cognitive, and hearing and visual impairment Diet and physical activities Evidence for depression or mood disorders  The patient's weight, height, BMI, and visual acuity have been recorded in the chart.  I have made referrals, counseling, and provided education to the patient based on review of the above and I have provided the patient with a written personalized care plan for preventive services.     Ryle Buscemi, MD   09/27/2015

## 2015-10-21 LAB — HM MAMMOGRAPHY

## 2015-10-30 ENCOUNTER — Other Ambulatory Visit: Payer: Self-pay | Admitting: Family Medicine

## 2015-11-04 ENCOUNTER — Encounter: Payer: Self-pay | Admitting: Family Medicine

## 2015-11-04 ENCOUNTER — Ambulatory Visit (INDEPENDENT_AMBULATORY_CARE_PROVIDER_SITE_OTHER): Payer: Medicare Other | Admitting: Family Medicine

## 2015-11-04 VITALS — BP 108/64 | HR 105 | Ht 69.0 in | Wt 148.0 lb

## 2015-11-04 DIAGNOSIS — F418 Other specified anxiety disorders: Secondary | ICD-10-CM | POA: Diagnosis not present

## 2015-11-04 MED ORDER — ESCITALOPRAM OXALATE 20 MG PO TABS: 20.0000 mg | ORAL_TABLET | Freq: Every day | ORAL | Status: DC

## 2015-11-04 MED ORDER — ALPRAZOLAM 0.5 MG PO TABS: ORAL_TABLET | ORAL | Status: DC

## 2015-11-04 NOTE — Progress Notes (Addendum)
   Subjective:    Patient ID: Carolyn Hendrix, female    DOB: 05-29-1941, 75 y.o.   MRN: [id number]  HPI Depression/Anxiety - patient came in today to discuss her mood. She is just feeling stressed and overwhelmed. Unfortunately she's had some family problems. She remarried about 3 years ago. She's been very happy in her marriage but her husband has 4 daughters to have not been very supportive and has been accusing her of not taking good care of their father. She said she was confronted around Thanksgiving by them and then again right after Christmas. She says at this point she's not sure what she is going to do. She may decide to end the marriage if this continues. She is actually going to her daughters today who lives about 2 hours away. She can spend a few days there just to try to relax and decompressed. Says several years ago she went into a deep depression where she became amiss catatonic. She was unable to take care for herself or dress herself. She says emotionally she feels herself slipping in that direction. She is currently on Lexapro 10 mg and uses alprazolam as needed.   Review of Systems     Objective:   Physical Exam  Constitutional: She is oriented to person, place, and time. She appears well-developed and well-nourished.  HENT:  Head: Normocephalic and atraumatic.  Eyes: Conjunctivae and EOM are normal.  Cardiovascular: Normal rate.   Pulmonary/Chest: Effort normal.  Neurological: She is alert and oriented to person, place, and time.  Skin: Skin is dry. No pallor.  Psychiatric: She has a normal mood and affect. Her behavior is normal.  Vitals reviewed.         Assessment & Plan:  Depression/anxiety-we discussed several options today. I would really like to think about getting her in with a therapist or counselor. It does sound like she has a good support system with her family but I encouraged her to still think about this. She said that she would consider it and let me  know. In the meantime we will try increasing her Lexapro to 20 mg and a quite refill her alprazolam. I want to see her back in one month to make sure that she is doing okay. I strongly strongly encouraged her to call me sooner if she feels like her mood is to continuing to decline. We could always change the Lexapro to something else if needed. PHQ 9 score of 22 today and got 7 score of 18. She rates her symptoms as very difficult but not extremely difficult. Ring is consistent with severe anxiety and severe depression. Though she denies any thoughts of wanting to harm herself.

## 2015-11-17 LAB — HM COLONOSCOPY

## 2015-11-18 ENCOUNTER — Encounter: Payer: Self-pay | Admitting: Family Medicine

## 2015-12-06 ENCOUNTER — Encounter: Payer: Self-pay | Admitting: Family Medicine

## 2015-12-06 ENCOUNTER — Ambulatory Visit (INDEPENDENT_AMBULATORY_CARE_PROVIDER_SITE_OTHER): Payer: Medicare Other | Admitting: Family Medicine

## 2015-12-06 VITALS — BP 117/70 | HR 88 | Wt 148.0 lb

## 2015-12-06 DIAGNOSIS — K589 Irritable bowel syndrome without diarrhea: Secondary | ICD-10-CM

## 2015-12-06 DIAGNOSIS — F418 Other specified anxiety disorders: Secondary | ICD-10-CM

## 2015-12-06 MED ORDER — ALPRAZOLAM 0.5 MG PO TABS: ORAL_TABLET | ORAL | Status: DC

## 2015-12-06 MED ORDER — LINACLOTIDE 290 MCG PO CAPS: 290.0000 ug | ORAL_CAPSULE | Freq: Every day | ORAL | Status: DC

## 2015-12-06 NOTE — Progress Notes (Signed)
   Subjective:    Patient ID: Carolyn Hendrix, female    DOB: [birth year], 75 y.o.   MRN: [id number]  HPI  She had her colonoscopy 3 weeks ago [facility]. She says her IBS has been flaring since then.  She did take old Linzess and that helped her get her bowels moving. She just had a lot of gas and bloating since then in a hard time moving her bowels. She has taken MiraLAX a few times as well.  She does feel like avoiding gluten has really helped her overall with her bowels.  Follow-up depression with anxiety-She has been less stressed.  When I last saw her she was having a lot of difficulty dealing with the adult children of her husband. She was actually going to stay with her daughter for a few days just to get out of the home and get away from the situation. She is doing better. In fact her husband is talking about selling his home possibly an moving into her Cond which she is currently renting out. She is doing well with the increased dose of Lexapro denies any side effects on the increased regimen. She does feel down about half the days but denies any thoughts of wanting to harm herself. She also complains of feeling nervous and on edge several days of the week but overall feels like the medication is working well. I discussed her seeing a therapist when I last saw her. She still wants to put that on hold.  Review of Systems     Objective:   Physical Exam  Constitutional: She is oriented to person, place, and time. She appears well-developed and well-nourished.  HENT:  Head: Normocephalic and atraumatic.  Cardiovascular: Normal rate, regular rhythm and normal heart sounds.   Pulmonary/Chest: Effort normal and breath sounds normal.  Neurological: She is alert and oriented to person, place, and time.  Skin: Skin is warm and dry.  Psychiatric: She has a normal mood and affect. Her behavior is normal.       Assessment & Plan:  Irritable bowel syndrome, acute flare-we'll restart  Linzess. New prescription given. Coupon card provided.  Depression with anxiety-score of 4 today which is down significantly from previous score of 18. PHQ 9 score of 7 today, again down significantly from previous score of 22. Significant improvement from previous. I think part of this is just improvement in her current situation. It sounds like she will have some new stressors coming up in the next few months such as moving again. Discussed we will continue with current regimen. She's doing well when I see her back in 4-6 months and we can consider decreasing the Lexapro back down to 10 mg.

## 2016-01-05 ENCOUNTER — Ambulatory Visit (INDEPENDENT_AMBULATORY_CARE_PROVIDER_SITE_OTHER): Payer: Medicare Other | Admitting: Family Medicine

## 2016-01-05 ENCOUNTER — Encounter: Payer: Self-pay | Admitting: Family Medicine

## 2016-01-05 VITALS — BP 132/63 | HR 84 | Wt 146.0 lb

## 2016-01-05 DIAGNOSIS — F339 Major depressive disorder, recurrent, unspecified: Secondary | ICD-10-CM

## 2016-01-05 MED ORDER — BUSPIRONE HCL 5 MG PO TABS: 5.0000 mg | ORAL_TABLET | Freq: Two times a day (BID) | ORAL | Status: DC | PRN

## 2016-01-05 NOTE — Progress Notes (Signed)
   Subjective:    Patient ID: Carolyn Hendrix, female    DOB: 03-17-1941, 75 y.o.   MRN: [id number]  HPI She has been under a lot of stress.  Had another fConfrontation with her husbands children. She really is trying not to take xanax so came in today to discuss she is on egde. She is planning on moving back into her townhome.  She is not sure if she will be staying with her husband or not. She complains of feeling down and depressed almost every day and feeling exhausted. She denies any thoughts of going to harm herself. She sounded more difficult to take care of things at home and stay organized without feeling overwhelmed. She also feels anxious and nervous more than half of the days and feels occasionally irritable. She says she is planning to start working on a diary where she can really collect her thoughts and really process information and meditate.   Review of Systems     Objective:   Physical Exam  Constitutional: She is oriented to person, place, and time. She appears well-developed and well-nourished.  HENT:  Head: Normocephalic and atraumatic.  Cardiovascular: Normal rate, regular rhythm and normal heart sounds.   Pulmonary/Chest: Effort normal and breath sounds normal.  Neurological: She is alert and oriented to person, place, and time.  Skin: Skin is warm and dry.  Psychiatric: She has a normal mood and affect. Her behavior is normal.          Assessment & Plan:  Major Depression with anxiety - will add buspar to her regimen. Hopefully for short-term. Really want to avoid increasing bezos.  I also think the medication be extremely helpful for her. PHQ 9 score today was 17 which was up from previous of 7. Dad 7 score was 9 today which was up from previous of 4. She rated her symptoms as very difficult and I feel that this is definitely related to the acute situation going on not from lack of effective the medication itself. He is going on a trip with her granddaughter next  month and is looking forward to that and I encouraged her to stay positive and focus on the things are important in her life like her grandchildren.  Time spent 20 min, > 50% spent  Counseling about her depression and anxiety.

## 2016-01-14 ENCOUNTER — Telehealth: Payer: Self-pay

## 2016-01-14 NOTE — Telephone Encounter (Signed)
Carolyn DavenportSandra complains she has increased confusion. She increased the Buspar about a week ago. She is not sure if it is the medication or the stress she is going through that is causing the confusion.

## 2016-01-17 NOTE — Telephone Encounter (Signed)
Please see what dose she is taking. We may need to decrease it.

## 2016-01-18 NOTE — Telephone Encounter (Signed)
Okay, let's just stay with that dose for now.

## 2016-01-18 NOTE — Telephone Encounter (Signed)
Patient aware.

## 2016-01-18 NOTE — Telephone Encounter (Signed)
She is taking 5 mg twice daily. She states she reduced to once daily. She is doing better now.

## 2016-01-25 ENCOUNTER — Other Ambulatory Visit: Payer: Self-pay | Admitting: Family Medicine

## 2016-01-29 ENCOUNTER — Other Ambulatory Visit: Payer: Self-pay | Admitting: Family Medicine

## 2016-02-02 ENCOUNTER — Ambulatory Visit (INDEPENDENT_AMBULATORY_CARE_PROVIDER_SITE_OTHER): Payer: Medicare Other | Admitting: Family Medicine

## 2016-02-02 ENCOUNTER — Encounter: Payer: Self-pay | Admitting: Family Medicine

## 2016-02-02 VITALS — BP 114/59 | HR 95 | Wt 142.0 lb

## 2016-02-02 DIAGNOSIS — F324 Major depressive disorder, single episode, in partial remission: Secondary | ICD-10-CM | POA: Diagnosis not present

## 2016-02-02 DIAGNOSIS — K589 Irritable bowel syndrome without diarrhea: Secondary | ICD-10-CM

## 2016-02-02 MED ORDER — PRAVASTATIN SODIUM 40 MG PO TABS: ORAL_TABLET | ORAL | Status: DC

## 2016-02-02 MED ORDER — DICYCLOMINE HCL 10 MG PO CAPS: 10.0000 mg | ORAL_CAPSULE | Freq: Three times a day (TID) | ORAL | Status: DC | PRN

## 2016-02-02 NOTE — Progress Notes (Signed)
   Subjective:    Patient ID: Carolyn Hendrix, female    DOB: Sep 09, 1941, 75 y.o.   MRN: [id number]  HPI Follow-up major depression-she is actually did doing okay overall. She did try the BuSpar but says it made her feel dizzy and confused. She had called here and we have encouraged her to go back down to 1 tab a day she eventually just stopped it on her own. She is getting ready to go [place] a trip [place] and after that is connected to the beach for a week and she is actually really looking forward to this. She feels like it will lower her stress overall.She will likely be moving back into her old condo this summer out of her husband's home because of conflict with her husband's family.  Irritable bowel syndrome-she would like to have something to particularly address the bloating and cramping. She tried both Amitiza and Linzess and both caused diarrhea. She said increasing her fiber tends to just cause more bloating so that doesn't work well for her either. She mostly relies on MiraLAX to move her bowels regularly and says she does well with that with minimal side effects.  Review of Systems     Objective:   Physical Exam  Constitutional: She is oriented to person, place, and time. She appears well-developed and well-nourished.  HENT:  Head: Normocephalic and atraumatic.  Cardiovascular: Normal rate, regular rhythm and normal heart sounds.   Pulmonary/Chest: Effort normal and breath sounds normal.  Abdominal: Soft. Bowel sounds are normal. She exhibits no distension and no mass. There is no tenderness. There is no rebound and no guarding.  Neurological: She is alert and oriented to person, place, and time.  Skin: Skin is warm and dry.  Psychiatric: She has a normal mood and affect. Her behavior is normal.        Assessment & Plan:  Major depression disorder-GAD 7 score of 5 today, previous of 9. PHQ 9 score of 4 today, previous of 17. I really think a lot of her  mood is definitely situational. Hopefully she'll be moving soon and get settled in and that will help great deal as well.  Irritable bowel syndrome-can try Bentyl for the abdominal cramping. We also discussed strategies for bloating. She might even want to try lactose-free diet to see if that helps. There she has tested negative for milk allergy in the past she may not have as much lactase to help break down the milk products. We also discussed a trial of a probiotic over-the-counter. She says she has used that in the past that she might try that again.

## 2016-02-03 ENCOUNTER — Ambulatory Visit: Payer: Medicare Other | Admitting: Family Medicine

## 2016-02-09 ENCOUNTER — Other Ambulatory Visit: Payer: Self-pay | Admitting: Family Medicine

## 2016-02-23 DIAGNOSIS — Z9689 Presence of other specified functional implants: Secondary | ICD-10-CM | POA: Insufficient documentation

## 2016-02-23 DIAGNOSIS — Z9889 Other specified postprocedural states: Secondary | ICD-10-CM

## 2016-02-24 ENCOUNTER — Other Ambulatory Visit: Payer: Self-pay | Admitting: Family Medicine

## 2016-03-08 ENCOUNTER — Other Ambulatory Visit: Payer: Self-pay | Admitting: Family Medicine

## 2016-04-03 ENCOUNTER — Encounter: Payer: Self-pay | Admitting: Family Medicine

## 2016-04-03 ENCOUNTER — Ambulatory Visit (INDEPENDENT_AMBULATORY_CARE_PROVIDER_SITE_OTHER): Payer: Medicare Other | Admitting: Family Medicine

## 2016-04-03 VITALS — BP 106/58 | HR 93 | Wt 143.0 lb

## 2016-04-03 DIAGNOSIS — M5137 Other intervertebral disc degeneration, lumbosacral region: Secondary | ICD-10-CM

## 2016-04-03 DIAGNOSIS — F339 Major depressive disorder, recurrent, unspecified: Secondary | ICD-10-CM | POA: Diagnosis not present

## 2016-04-03 DIAGNOSIS — M51379 Other intervertebral disc degeneration, lumbosacral region without mention of lumbar back pain or lower extremity pain: Secondary | ICD-10-CM

## 2016-04-03 DIAGNOSIS — K582 Mixed irritable bowel syndrome: Secondary | ICD-10-CM | POA: Diagnosis not present

## 2016-04-03 MED ORDER — PREGABALIN 50 MG PO CAPS: 50.0000 mg | ORAL_CAPSULE | Freq: Every day | ORAL | Status: DC

## 2016-04-03 NOTE — Patient Instructions (Addendum)
When you start the Lyrica take 1 a day for the first 3 days and then after that, twice a day. I'm going keep you on a low dose until I see you back and then we can adjust it as needed depending on how well you're doing.

## 2016-04-03 NOTE — Progress Notes (Signed)
Subjective:    CC: Mood  HPI:  Follow-up major depressive disorder-she is doing well overall.She did move back to her old condo. She says she loves being there but still having difficulty with her marriage. They're currently taking a break. In fact she said she seeing a lawyer in a couple weeks just to make sure that her affairs are straight.  IBS - He says her stomach hasn't been right for the last couple of weeks. She's been taking her Bentyl assessment sometimes it makes her feel foggy headed. She also took a dose of MiraLAX last night did have a bowel movement this morning. She notices just her IBS and she has been under more stress recently.  She would like to try something for her pain. She says ultimately she would like to get off the hydrocodone for her chronic back pain. She's had multiple back surgeries. She says she is currently down to about 2 per day. She tried Cymbalta in the past but it caused diarrhea. And she didn't do well with gabapentin either. She has not tried Lyrica.  Past medical history, Surgical history, Family history not pertinant except as noted below, Social history, Allergies, and medications have been entered into the medical record, reviewed, and corrections made.   Review of Systems: No fevers, chills, night sweats, weight loss, chest pain, or shortness of breath.   Objective:    General: Well Developed, well nourished, and in no acute distress.  Neuro: Alert and oriented x3, extra-ocular muscles intact, sensation grossly intact.  HEENT: Normocephalic, atraumatic  Skin: Warm and dry, no rashes. Cardiac: Regular rate and rhythm, no murmurs rubs or gallops, no lower extremity edema.  Respiratory: Clear to auscultation bilaterally. Not using accessory muscles, speaking in full sentences.   Impression and Recommendations:   MDD- Stable. Dad 7 score of 5 and PHQ 9 score of 7. Still dealing with difficulties in her marriage but happy to be back in her old condo.  She rates her symptoms is somewhat difficult denies any thoughts of going to harm herself. We'll continue with Lexapro.  IBS - She has been flaring over the last couple weeks but her Bentyl does seem to help. She currently is taking MiraLAX to help with moving her bowels.  Chronic back pain-would like to try Lyrica. Discussed options and discussed potential side effects. New prescription sent to the pharmacy. I will see her back in about 6 weeks to see how she's doing and to titrate her dose as needed.  Colon Cancer screening - done

## 2016-04-13 ENCOUNTER — Encounter: Payer: Self-pay | Admitting: Family Medicine

## 2016-04-13 ENCOUNTER — Other Ambulatory Visit: Payer: Self-pay | Admitting: Family Medicine

## 2016-04-13 ENCOUNTER — Telehealth: Payer: Self-pay

## 2016-04-13 NOTE — Telephone Encounter (Signed)
Carolyn DavenportSandra called and states she has increased the Lyrica to twice daily. She is wanting to stop taking the Lyrica because it is causing confusion and a feeling of foggy headedness. She wants to know if she needs to titrate off of the Lyrica or can she just stop taking it. She has been on the medication for 1 1/2 weeks.

## 2016-04-13 NOTE — Telephone Encounter (Signed)
If she felt better on one tab daily and she can just drop to one tab, otherwise if she really wants to, she can completely stop it.  I'm assuming this is for her back pain, I'd be happy to see her and determine if anything interventional could be done.  Whats the most recent plan from neurosurgery?

## 2016-04-14 NOTE — Telephone Encounter (Signed)
Patient advised of recommendations. She has appointment with neurosurgery next week.

## 2016-05-07 ENCOUNTER — Other Ambulatory Visit: Payer: Self-pay | Admitting: Family Medicine

## 2016-05-15 ENCOUNTER — Encounter: Payer: Self-pay | Admitting: Family Medicine

## 2016-05-15 ENCOUNTER — Ambulatory Visit (INDEPENDENT_AMBULATORY_CARE_PROVIDER_SITE_OTHER): Payer: Medicare Other | Admitting: Family Medicine

## 2016-05-15 VITALS — BP 106/56 | HR 66 | Wt 144.0 lb

## 2016-05-15 DIAGNOSIS — F339 Major depressive disorder, recurrent, unspecified: Secondary | ICD-10-CM

## 2016-05-15 DIAGNOSIS — M5137 Other intervertebral disc degeneration, lumbosacral region: Secondary | ICD-10-CM

## 2016-05-15 DIAGNOSIS — M51379 Other intervertebral disc degeneration, lumbosacral region without mention of lumbar back pain or lower extremity pain: Secondary | ICD-10-CM

## 2016-05-15 NOTE — Progress Notes (Signed)
Subjective:    CC: Depression  SRP:RXYV today to follow-up for major recurrent depression. She does complain of feeling down and depressed several days of the week and having low energy and difficulty concentrating several days of the week. She's currently on Lexapro 20 mg daily as well as alprazolam as needed.   Follow-up for chronic back pain is well-when she was here last time she really want to try to get off of narcotic pain medication so we decided to try Lyrica.  She goes to [facility].  She says unfortunately it made her more angry and irritable and depressed so after few weeks she stopped it. She is just sick with her hydrocodone twice a day which she's currently taking. She says she still working on trying to get the settings adjusted for her stimulator as well.  Past medical history, Surgical history, Family history not pertinant except as noted below, Social history, Allergies, and medications have been entered into the medical record, reviewed, and corrections made.   Review of Systems: No fevers, chills, night sweats, weight loss, chest pain, or shortness of breath.   Objective:    General: Well Developed, well nourished, and in no acute distress.  Neuro: Alert and oriented x3, extra-ocular muscles intact, sensation grossly intact.  HEENT: Normocephalic, atraumatic  Skin: Warm and dry, no rashes. Cardiac: Regular rate and rhythm, no murmurs rubs or gallops, no lower extremity edema.  Respiratory: Clear to auscultation bilaterally. Not using accessory muscles, speaking in full sentences.   Impression and Recommendations:   Depression-PHQ 9 score of 4 today. She rates her symptoms is somewhat difficult. She wants to continue with the 20 mg the Lexapro for now. Will follow-up in 4 months.  Chronic back Pain - she follows with pain management.  Patient did not tolerate Lyrica. It caused a shift in mood. She's been a stick with her hydrocodone twice a day that she is  getting from pain management and continue to work on adjusting the settings on her stimulator.

## 2016-05-25 ENCOUNTER — Encounter: Payer: Self-pay | Admitting: *Deleted

## 2016-07-01 ENCOUNTER — Other Ambulatory Visit: Payer: Self-pay | Admitting: Family Medicine

## 2016-07-20 ENCOUNTER — Other Ambulatory Visit: Payer: Self-pay | Admitting: Family Medicine

## 2016-07-20 ENCOUNTER — Other Ambulatory Visit: Payer: Self-pay | Admitting: *Deleted

## 2016-07-20 MED ORDER — DICYCLOMINE HCL 10 MG PO CAPS: ORAL_CAPSULE | ORAL | 0 refills | Status: DC

## 2016-07-20 NOTE — Progress Notes (Signed)
Refill request for a 90 day supply. Refill just sent on 07/03/16 for 30. Cheaper for her to get 90 day supply

## 2016-08-01 ENCOUNTER — Other Ambulatory Visit: Payer: Self-pay | Admitting: Family Medicine

## 2016-08-17 ENCOUNTER — Other Ambulatory Visit: Payer: Self-pay | Admitting: Family Medicine

## 2016-08-27 ENCOUNTER — Other Ambulatory Visit: Payer: Self-pay | Admitting: Family Medicine

## 2016-09-11 ENCOUNTER — Ambulatory Visit (INDEPENDENT_AMBULATORY_CARE_PROVIDER_SITE_OTHER): Payer: Medicare Other | Admitting: Family Medicine

## 2016-09-11 ENCOUNTER — Encounter: Payer: Self-pay | Admitting: Family Medicine

## 2016-09-11 VITALS — BP 109/61 | HR 97 | Ht 69.0 in | Wt 144.0 lb

## 2016-09-11 DIAGNOSIS — F339 Major depressive disorder, recurrent, unspecified: Secondary | ICD-10-CM | POA: Diagnosis not present

## 2016-09-11 DIAGNOSIS — K2100 Gastro-esophageal reflux disease with esophagitis, without bleeding: Secondary | ICD-10-CM

## 2016-09-11 DIAGNOSIS — F5101 Primary insomnia: Secondary | ICD-10-CM

## 2016-09-11 DIAGNOSIS — K582 Mixed irritable bowel syndrome: Secondary | ICD-10-CM | POA: Diagnosis not present

## 2016-09-11 NOTE — Progress Notes (Signed)
Subjective:    CC: MDD, Insomnia  HPI:  MDD- Overall she is doing really well. She is back with her husband. That she's currently with her family for Thanksgiving and he can see his family. She feels like her mood is really well controlled. She was to continue with her Lexapro 20 mg. She is Artie started backing off on her alprazolam and using it more sparingly.  Insomnia - sleep has been okay overall.  She does feel like her IBS has been flaring a little bit. That she's had some new symptoms that started about 3 weeks ago. She's not sure she got food poisoning or stomach bug. But she started getting some burning in her esophagus as well as some abdominal cramping. She does take Prilosec daily and this is not new. She says normally controls her heartburn symptoms well. No fevers. No blood in the stool.  Past medical history, Surgical history, Family history not pertinant except as noted below, Social history, Allergies, and medications have been entered into the medical record, reviewed, and corrections made.   Review of Systems: No fevers, chills, night sweats, weight loss, chest pain, or shortness of breath.   Objective:    General: Well Developed, well nourished, and in no acute distress.  Neuro: Alert and oriented x3, extra-ocular muscles intact, sensation grossly intact.  HEENT: Normocephalic, atraumatic  Skin: Warm and dry, no rashes. Cardiac: Regular rate and rhythm, no murmurs rubs or gallops, no lower extremity edema.  Respiratory: Clear to auscultation bilaterally. Not using accessory muscles, speaking in full sentences.   Impression and Recommendations:   MDD - GAD- 7 score of 0 and PHQ 9 score of 0. She is overall doing absolutely fantastic. Continue current regimen. Continue to wean the alprazolam use.  Insomnia - Stable.  GERD- increase PPI to twice a day. Avoid food such as caffeine coffee spicy and greasy foods and carbonated beverages. See if symptoms improve over the  next 2 weeks. If not them please let me know.  Patient brought in living will and healthcare power of attorney today.

## 2016-10-04 ENCOUNTER — Other Ambulatory Visit: Payer: Self-pay | Admitting: Family Medicine

## 2016-10-11 ENCOUNTER — Other Ambulatory Visit: Payer: Self-pay | Admitting: Family Medicine

## 2016-10-28 ENCOUNTER — Other Ambulatory Visit: Payer: Self-pay | Admitting: Family Medicine

## 2016-10-30 ENCOUNTER — Other Ambulatory Visit: Payer: Self-pay | Admitting: Family Medicine

## 2016-11-07 ENCOUNTER — Other Ambulatory Visit: Payer: Self-pay

## 2016-11-07 DIAGNOSIS — E7800 Pure hypercholesterolemia, unspecified: Secondary | ICD-10-CM

## 2016-11-07 DIAGNOSIS — Z0000 Encounter for general adult medical examination without abnormal findings: Secondary | ICD-10-CM

## 2016-11-07 DIAGNOSIS — D649 Anemia, unspecified: Secondary | ICD-10-CM

## 2016-11-07 DIAGNOSIS — Z1329 Encounter for screening for other suspected endocrine disorder: Secondary | ICD-10-CM

## 2016-11-08 ENCOUNTER — Ambulatory Visit: Payer: Medicare Other | Admitting: Family Medicine

## 2016-11-09 ENCOUNTER — Encounter: Payer: Medicare Other | Admitting: Family Medicine

## 2016-11-10 ENCOUNTER — Ambulatory Visit (INDEPENDENT_AMBULATORY_CARE_PROVIDER_SITE_OTHER): Payer: Medicare Other | Admitting: Family Medicine

## 2016-11-10 ENCOUNTER — Encounter: Payer: Self-pay | Admitting: Family Medicine

## 2016-11-10 VITALS — BP 116/66 | HR 81 | Ht 69.0 in | Wt 148.0 lb

## 2016-11-10 DIAGNOSIS — E7800 Pure hypercholesterolemia, unspecified: Secondary | ICD-10-CM | POA: Diagnosis not present

## 2016-11-10 DIAGNOSIS — Z23 Encounter for immunization: Secondary | ICD-10-CM | POA: Diagnosis not present

## 2016-11-10 DIAGNOSIS — Z0000 Encounter for general adult medical examination without abnormal findings: Secondary | ICD-10-CM | POA: Diagnosis not present

## 2016-11-10 DIAGNOSIS — Z79899 Other long term (current) drug therapy: Secondary | ICD-10-CM

## 2016-11-10 DIAGNOSIS — E785 Hyperlipidemia, unspecified: Secondary | ICD-10-CM | POA: Insufficient documentation

## 2016-11-10 LAB — LIPID PANEL
CHOLESTEROL: 179 mg/dL (ref <200)
HDL: 45 mg/dL — AB (ref >50)
LDL CALC: 99 mg/dL (ref <100)
TRIGLYCERIDES: 174 mg/dL — AB (ref <150)
Total CHOL/HDL Ratio: 4 Ratio (ref <5.0)
VLDL: 35 mg/dL — ABNORMAL HIGH (ref <30)

## 2016-11-10 LAB — COMPLETE METABOLIC PANEL WITH GFR
ALT: 15 U/L (ref 6–29)
AST: 22 U/L (ref 10–35)
Albumin: 4.1 g/dL (ref 3.6–5.1)
Alkaline Phosphatase: 83 U/L (ref 33–130)
BILIRUBIN TOTAL: 0.4 mg/dL (ref 0.2–1.2)
BUN: 20 mg/dL (ref 7–25)
CHLORIDE: 101 mmol/L (ref 98–110)
CO2: 31 mmol/L (ref 20–31)
Calcium: 9.4 mg/dL (ref 8.6–10.4)
Creat: 0.93 mg/dL (ref 0.60–0.93)
GFR, EST NON AFRICAN AMERICAN: 61 mL/min (ref >=60)
GFR, Est African American: 71 mL/min (ref >=60)
Glucose, Bld: 79 mg/dL (ref 65–99)
Potassium: 4.4 mmol/L (ref 3.5–5.3)
Sodium: 140 mmol/L (ref 135–146)
TOTAL PROTEIN: 6.9 g/dL (ref 6.1–8.1)

## 2016-11-10 MED ORDER — AMBULATORY NON FORMULARY MEDICATION: 0 refills | Status: DC

## 2016-11-10 NOTE — Patient Instructions (Signed)
Keep up a regular exercise program and make sure you are eating a healthy diet Try to eat 4 servings of dairy a day, or if you are lactose intolerant take a calcium with vitamin D daily.  Your vaccines are up to date.   

## 2016-11-10 NOTE — Progress Notes (Signed)
Subjective:   Carolyn Hendrix is a 77 y.o. female who presents for Medicare Annual (Subsequent) preventive examination.  She also complains of left shoulder pain for several weeks. She says that she doesn't remember specific injury per se just noticed that it was difficult to reach back behind her. She is able to still reach past 90 to fix her hair etc. 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 She's not currently taking any medication for it. She says sometimes the pain will radiate from the shoulder down towards her elbow.  Review of Systems:  Comprehensive ROS is negative.          Objective:     Vitals: BP 116/66   Pulse 81   Ht 5\' 9"  (1.753 m)   Wt 148 lb (67.1 kg)   SpO2 96%   BMI 21.86 kg/m   Body mass index is 21.86 kg/m.  Physical Exam  Constitutional: She is oriented to person, place, and time. She appears well-developed and well-nourished.  HENT:  Head: Normocephalic and atraumatic.  Right Ear: External ear normal.  Left Ear: External ear normal.  Nose: Nose normal.  Mouth/Throat: Oropharynx is clear and moist.  TMs and canals are clear.   Eyes: Conjunctivae and EOM are normal. Pupils are equal, round, and reactive to light.  Neck: Neck supple. No thyromegaly present.  Cardiovascular: Normal rate, regular rhythm and normal heart sounds.   Pulmonary/Chest: Effort normal and breath sounds normal. She has no wheezes. Right breast exhibits no inverted nipple, no mass and no nipple discharge. Left breast exhibits no inverted nipple, no mass and no nipple discharge.  Incision well healed on the left breast with some dense tissue beneath which I suspect is scar tissue.   Abdominal: Soft. Bowel sounds are normal.  Lymphadenopathy:    She has no cervical adenopathy.  Neurological: She is alert and oriented to person, place, and time.  Skin: Skin is warm and dry.  Psychiatric: She has a  normal mood and affect.     Tobacco History  Smoking Status  . Former Smoker  . Types: Cigarettes  . Quit date: 10/23/1980  Smokeless Tobacco  . Not on file     Counseling given: Not Answered   Past Medical History:  Diagnosis Date  . Chronic LBP    Lumbar DDD  . Colitis, ischemic (HCC)   . Depression   . Endometriosis   . GERD (gastroesophageal reflux disease)   . Hematuria   . Hyperlipidemia   . IBS (irritable bowel syndrome)    Fr Hurrelbrink- on Miralax and Citrucel daily  . Migraines   . MVP (mitral valve prolapse)    Past Surgical History:  Procedure Laterality Date  . ABDOMINAL HYSTERECTOMY  1991   w/ bilat oophorectomy for endometriosis  . APPENDECTOMY    . ESOPHAGOGASTRODUODENOSCOPY  06  . HEMILAMINOTOMY LUMBAR SPINE  11/18/1991   Dr. Lorain Childes  . lapartomy  1995  . OTHER SURGICAL HISTORY  1992, 1994  . right breast biopsy- benign    . SPINAL FUSION  08/18/97   Dr. Dorita Fray at [facility], L4-5 post fusion with iliac crest bone graft  . TONSILLECTOMY AND ADENOIDECTOMY     Family History  Problem Relation Age of Onset  . Alzheimer's disease Mother   . Other Father     ACS? - 51's  . Ataxia Sister    History  Sexual  Activity  . Sexual activity: Not on file    Outpatient Encounter Prescriptions as of 11/10/2016  Medication Sig  . AMBULATORY NON FORMULARY MEDICATION Medication Name: Tdap IM x 1  . dicyclomine (BENTYL) 10 MG capsule TAKE 1 CAPSULE (10 MG TOTAL) BY MOUTH 3 (THREE) TIMES DAILY AS NEEDED FOR SPASMS.  Marland Kitchen escitalopram (LEXAPRO) 20 MG tablet TAKE 1 TABLET BY MOUTH EVERY DAY  . HYDROcodone-acetaminophen (NORCO) 10-325 MG per tablet Take 1 tablet by mouth. Take 1-2 tablets by mouth daily  . omeprazole (PRILOSEC) 20 MG capsule TAKE ONE CAPSULE EVERY DAY  . pravastatin (PRAVACHOL) 40 MG tablet TAKE 1 TABLET (40 MG TOTAL) BY MOUTH AT BEDTIME.  . traZODone (DESYREL) 100 MG tablet TAKE 1 TABLET (100 MG TOTAL) BY MOUTH AT BEDTIME.  Marland Kitchen TURMERIC PO  Take by mouth daily.  . [DISCONTINUED] ALPRAZolam (XANAX) 0.5 MG tablet TAKE 1/2 TO 1 TAB TWICE A DAY A NEEDED FOR ANXIETY   No facility-administered encounter medications on file as of 11/10/2016.     Activities of Daily Living In your present state of health, do you have any difficulty performing the following activities: 11/10/2016  Hearing? N  Vision? N  Difficulty concentrating or making decisions? N  Walking or climbing stairs? N  Dressing or bathing? N  Doing errands, shopping? N  Some recent data might be hidden    Patient Care Team: Agapito Games, MD as PCP - General (Family Medicine) Rosario Jacks, MD as Referring Physician (Obstetrics and Gynecology) Charyl Bigger, MD as Consulting Physician (Anesthesiology) Farris Has, MD as Referring Physician (Dermatology)    Assessment:    Medicare WEllness Exam   Exercise Activities and Dietary recommendations Current Exercise Habits: Home exercise routine, Type of exercise: yoga;Other - see comments (Using teh Wii), Frequency (Times/Week): 3, Intensity: Mild  Goals    None     Fall Risk Fall Risk  09/27/2015 09/23/2014 09/23/2013  Falls in the past year? No No No   Depression Screen PHQ 2/9 Scores 05/15/2016 01/05/2016 12/06/2015 09/27/2015  PHQ - 2 Score 2 6 3  0  PHQ- 9 Score 4 17 7  -     Cognitive Function MMSE - Mini Mental State Exam 09/24/2014  Orientation to time 0  Orientation to Place 0  Registration 0  Attention/ Calculation 0  Recall 0  Language- name 2 objects 0  Language- repeat 0  Language- follow 3 step command 0  Language- read & follow direction 0  Write a sentence 0  Copy design 0  Total score 0     6CIT Screen 11/10/2016  What Year? 0 points  What month? 0 points  What time? 0 points  Count back from 20 0 points  Months in reverse 0 points  Repeat phrase 0 points  Total Score 0    Immunization History  Administered Date(s) Administered  . Influenza Split 08/30/2011,  08/07/2012  . Influenza Whole 08/19/2008, 08/03/2010  . Influenza, Seasonal, Injecte, Preservative Fre 07/22/2013  . Influenza,inj,Quad PF,36+ Mos 09/23/2014, 06/25/2015, 09/08/2016  . Pneumococcal Conjugate-13 09/23/2014  . Pneumococcal Polysaccharide-23 06/02/2009  . Td 05/16/2006  . Zoster 05/30/2011   Screening Tests Health Maintenance  Topic Date Due  . TETANUS/TDAP  05/16/2016  . MAMMOGRAM  10/20/2017  . COLONOSCOPY  11/16/2025  . INFLUENZA VACCINE  Completed  . DEXA SCAN  Completed  . ZOSTAVAX  Completed  . PNA vac Low Risk Adult  Completed      Plan:    During the course of  the visit the patient was educated and counseled about the following appropriate screening and preventive services:   Vaccines to include Tdap, given Rx to get at Pharmacy.    Cardiovascular Disease  Colorectal cancer screening  Bone density screening  Diabetes screening  Mammography - reminded her to schedule.   Nutrition counseling   Patient Instructions (the written plan) was given to the patient.   METHENEY,CATHERINE, MD  11/10/2016  Left shoulder pain-based on exam is consistent with rotator cuff injury. Recommend formal physical therapy. She's concerned about the cost will try some home PT as well as heat and anti-inflammatory. If not improving over the next 3-4 weeks and please let us know.

## 2016-11-11 LAB — CBC WITH DIFFERENTIAL/PLATELET
BASOS ABS: 57 {cells}/uL (ref 0–200)
Basophils Relative: 1 %
EOS ABS: 114 {cells}/uL (ref 15–500)
EOS PCT: 2 %
HCT: 38.6 % (ref 35.0–45.0)
Hemoglobin: 12.4 g/dL (ref 11.7–15.5)
LYMPHS PCT: 31 %
Lymphs Abs: 1767 cells/uL (ref 850–3900)
MCH: 30.2 pg (ref 27.0–33.0)
MCHC: 32.1 g/dL (ref 32.0–36.0)
MCV: 94.1 fL (ref 80.0–100.0)
MONOS PCT: 7 %
MPV: 9.4 fL (ref 7.5–12.5)
Monocytes Absolute: 399 cells/uL (ref 200–950)
Neutro Abs: 3363 cells/uL (ref 1500–7800)
Neutrophils Relative %: 59 %
PLATELETS: 339 10*3/uL (ref 140–400)
RBC: 4.1 MIL/uL (ref 3.80–5.10)
RDW: 14.5 % (ref 11.0–15.0)
WBC: 5.7 10*3/uL (ref 3.8–10.8)

## 2016-11-11 LAB — COMPLETE METABOLIC PANEL WITH GFR
ALT: 13 U/L (ref 6–29)
AST: 20 U/L (ref 10–35)
Albumin: 4.3 g/dL (ref 3.6–5.1)
Alkaline Phosphatase: 80 U/L (ref 33–130)
BILIRUBIN TOTAL: 0.5 mg/dL (ref 0.2–1.2)
BUN: 19 mg/dL (ref 7–25)
CHLORIDE: 101 mmol/L (ref 98–110)
CO2: 32 mmol/L — AB (ref 20–31)
CREATININE: 0.91 mg/dL (ref 0.60–0.93)
Calcium: 9.3 mg/dL (ref 8.6–10.4)
GFR, EST AFRICAN AMERICAN: 72 mL/min (ref >=60)
GFR, EST NON AFRICAN AMERICAN: 63 mL/min (ref >=60)
GLUCOSE: 77 mg/dL (ref 65–99)
Potassium: 4.2 mmol/L (ref 3.5–5.3)
SODIUM: 140 mmol/L (ref 135–146)
TOTAL PROTEIN: 6.8 g/dL (ref 6.1–8.1)

## 2016-11-11 LAB — TSH: TSH: 1.2 m[IU]/L

## 2016-11-11 LAB — LIPID PANEL
Cholesterol: 179 mg/dL (ref <200)
HDL: 45 mg/dL — AB (ref >50)
LDL CALC: 100 mg/dL — AB (ref <100)
TRIGLYCERIDES: 172 mg/dL — AB (ref <150)
Total CHOL/HDL Ratio: 4 Ratio (ref <5.0)
VLDL: 34 mg/dL — AB (ref <30)

## 2016-11-19 ENCOUNTER — Other Ambulatory Visit: Payer: Self-pay | Admitting: Family Medicine

## 2016-12-13 ENCOUNTER — Other Ambulatory Visit: Payer: Self-pay | Admitting: *Deleted

## 2016-12-13 MED ORDER — DICYCLOMINE HCL 10 MG PO CAPS: 10.0000 mg | ORAL_CAPSULE | Freq: Three times a day (TID) | ORAL | 2 refills | Status: DC | PRN

## 2016-12-19 ENCOUNTER — Other Ambulatory Visit: Payer: Self-pay | Admitting: Family Medicine

## 2017-01-11 ENCOUNTER — Encounter: Payer: Self-pay | Admitting: Family Medicine

## 2017-01-11 ENCOUNTER — Telehealth: Payer: Self-pay

## 2017-01-11 ENCOUNTER — Ambulatory Visit (INDEPENDENT_AMBULATORY_CARE_PROVIDER_SITE_OTHER): Payer: Medicare Other | Admitting: Family Medicine

## 2017-01-11 VITALS — BP 111/56 | HR 93 | Ht 69.0 in | Wt 145.0 lb

## 2017-01-11 DIAGNOSIS — R102 Pelvic and perineal pain: Secondary | ICD-10-CM | POA: Diagnosis not present

## 2017-01-11 DIAGNOSIS — N924 Excessive bleeding in the premenopausal period: Secondary | ICD-10-CM

## 2017-01-11 DIAGNOSIS — R319 Hematuria, unspecified: Secondary | ICD-10-CM | POA: Diagnosis not present

## 2017-01-11 LAB — POCT URINALYSIS DIPSTICK
BILIRUBIN UA: NEGATIVE
GLUCOSE UA: NEGATIVE
KETONES UA: NEGATIVE
Nitrite, UA: POSITIVE
Protein, UA: NEGATIVE
SPEC GRAV UA: 1.02 (ref 1.030–1.035)
UROBILINOGEN UA: 0.2 (ref ?–2.0)
pH, UA: 5.5 (ref 5.0–8.0)

## 2017-01-11 LAB — WET PREP, GENITAL
CLUE CELLS WET PREP: NONE SEEN
YEAST WET PREP: NONE SEEN

## 2017-01-11 MED ORDER — METRONIDAZOLE 500 MG PO TABS: 2000.0000 mg | ORAL_TABLET | Freq: Once | ORAL | 0 refills | Status: AC

## 2017-01-11 NOTE — Addendum Note (Signed)
Addended by: Nani GasserMETHENEY, Ravan Schlemmer D on: 01/11/2017 12:51 PM   Modules accepted: Orders

## 2017-01-11 NOTE — Progress Notes (Signed)
Subjective:    Patient ID: Carolyn Hendrix, female    DOB: 1941-10-13, 76 y.o.   MRN: [id number]  HPI 76 yo with hx of endometriosis and IBS comes in complaining of abdominal pain and vaginal brown discharge for several days. She  had a total hysterectomy in 1991.  NO dysuria.  Abdominal pain is in the lower abdomen. Says still feels some soreness there.   Had diarrhea on Sunday, but none since then . No fever, sweats or chills.  She says she tried to put a little tissue just inside the vagina and saw small amount of bright red blood. No worsening or alleviating factors.   Review of Systems   BP (!) 111/56   Pulse 93   Ht 5\' 9"  (1.753 m)   Wt 145 lb (65.8 kg)   SpO2 97%   BMI 21.41 kg/m     Allergies  Allergen Reactions  . Buspar [Buspirone] Other (See Comments)    Dizziness   . Cymbalta [Duloxetine Hcl] Diarrhea  . Morphine     Past Medical History:  Diagnosis Date  . Chronic LBP    Lumbar DDD  . Colitis, ischemic (HCC)   . Depression   . Endometriosis   . GERD (gastroesophageal reflux disease)   . Hematuria   . Hyperlipidemia   . IBS (irritable bowel syndrome)    Fr Carolyn Hendrix- on Miralax and Citrucel daily  . Migraines   . MVP (mitral valve prolapse)     Past Surgical History:  Procedure Laterality Date  . ABDOMINAL HYSTERECTOMY  1991   w/ bilat oophorectomy for endometriosis  . APPENDECTOMY    . ESOPHAGOGASTRODUODENOSCOPY  06  . HEMILAMINOTOMY LUMBAR SPINE  11/18/1991   Dr. Lorain ChildesWilliam Hendrix  . lapartomy  1995  . OTHER SURGICAL HISTORY  1992, 1994  . right breast biopsy- benign    . SPINAL FUSION  08/18/97   [facility] L4-5 post fusion with iliac crest bone graft  . TONSILLECTOMY AND ADENOIDECTOMY      Social History   Social History  . Marital status: Widowed    Spouse name: N/A  . Number of children: N/A  . Years of education: N/A   Occupational History  . Not on file.   Social History Main Topics  . Smoking status: Former Smoker     Types: Cigarettes    Quit date: 10/23/1980  . Smokeless tobacco: Never Used  . Alcohol use No  . Drug use: No  . Sexual activity: Not on file   Other Topics Concern  . Not on file   Social History Narrative  . No narrative on file    Family History  Problem Relation Age of Onset  . Alzheimer's disease Mother   . Other Father     ACS? - 6080's  . Ataxia Sister     Outpatient Encounter Prescriptions as of 01/11/2017  Medication Sig  . dicyclomine (BENTYL) 10 MG capsule Take 1 capsule (10 mg total) by mouth 3 (three) times daily as needed for spasms.  Marland Kitchen. escitalopram (LEXAPRO) 20 MG tablet TAKE 1 TABLET BY MOUTH EVERY DAY  . HYDROcodone-acetaminophen (NORCO) 10-325 MG per tablet Take 1 tablet by mouth. Take 1-2 tablets by mouth daily  . omeprazole (PRILOSEC) 20 MG capsule TAKE ONE CAPSULE EVERY DAY  . pravastatin (PRAVACHOL) 40 MG tablet TAKE 1 TABLET (40 MG TOTAL) BY MOUTH AT BEDTIME.  . traZODone (DESYREL) 100 MG tablet TAKE 1 TABLET (100 MG TOTAL)  BY MOUTH AT BEDTIME.  Marland Kitchen TURMERIC PO Take by mouth daily.  . [DISCONTINUED] ADACEL 02-21-14.5 LF-MCG/0.5 injection   . [DISCONTINUED] AMBULATORY NON FORMULARY MEDICATION Medication Name: Tdap IM x 1   No facility-administered encounter medications on file as of 01/11/2017.          Objective:   Physical Exam  Constitutional: She is oriented to person, place, and time. She appears well-developed and well-nourished.  HENT:  Head: Normocephalic and atraumatic.  Eyes: Conjunctivae and EOM are normal.  Cardiovascular: Normal rate.   Pulmonary/Chest: Effort normal.  Abdominal: Soft. Bowel sounds are normal.  Mildly tender over the suprapubic area. No rebound guarding  Genitourinary: There is bleeding in the vagina. No tenderness in the vagina. No foreign body in the vagina. No vaginal discharge found.  Genitourinary Comments: A pin point area of slow bleeding near the edge of the vaginal cuff. No rash or lesions. Bimanual exam normal  except absent cervix and uterus.  Neurological: She is alert and oriented to person, place, and time.  Skin: Skin is dry. No pallor.  Psychiatric: She has a normal mood and affect. Her behavior is normal.  Vitals reviewed.       Assessment & Plan:  Vaginal bleeding, postmenopausal - I think we, source of bleeding. She just had a slight pinpoint area just near the vaginal cuff that was bleeding. I did go ahead and do a KOH today just to rule out any overgrowth of bacteria or yeast that could be causing some extra irritation. This area could've been strained her irritated as she was having more severe diarrhea on Sunday. If the KOH is negative but our plan will be to see if this completely resolves over the weekend. If it does not she will need to come back and we may try to cauterize the area. I do not see anything worrisome on exam that needs to be treated or biopsied.  Pelvic pain - he still is a little tender suprapubically. She's not having any urinary symptoms but we'll go ahead and do a urinalysis today. Suspect this may just feel be some tenderness from her recent diarrhea episode. She has known irritable bowel syndrome. UA pos, will send for culture.

## 2017-01-11 NOTE — Telephone Encounter (Signed)
Nurse Practitioner who is seeing pt at home called and said that pt was having lower abdominal pain with spotty brown discharge from her vagina, today the discharge is bright red.  Transferred to scheduling to be seen today.

## 2017-01-14 LAB — URINE CULTURE

## 2017-01-15 MED ORDER — CIPROFLOXACIN HCL 500 MG PO TABS: 500.0000 mg | ORAL_TABLET | Freq: Two times a day (BID) | ORAL | 0 refills | Status: AC

## 2017-01-15 NOTE — Addendum Note (Signed)
Addended by: Nani GasserMETHENEY, CATHERINE D on: 01/15/2017 11:36 AM   Modules accepted: Orders

## 2017-01-29 ENCOUNTER — Ambulatory Visit (INDEPENDENT_AMBULATORY_CARE_PROVIDER_SITE_OTHER): Payer: Medicare Other | Admitting: Family Medicine

## 2017-01-29 VITALS — BP 124/76 | HR 90 | Wt 146.0 lb

## 2017-01-29 DIAGNOSIS — A599 Trichomoniasis, unspecified: Secondary | ICD-10-CM | POA: Diagnosis not present

## 2017-01-29 DIAGNOSIS — N390 Urinary tract infection, site not specified: Secondary | ICD-10-CM | POA: Diagnosis not present

## 2017-01-29 DIAGNOSIS — Z202 Contact with and (suspected) exposure to infections with a predominantly sexual mode of transmission: Secondary | ICD-10-CM | POA: Diagnosis not present

## 2017-01-29 LAB — WET PREP, GENITAL
CLUE CELLS WET PREP: NONE SEEN
Trich, Wet Prep: NONE SEEN
YEAST WET PREP: NONE SEEN

## 2017-01-29 NOTE — Telephone Encounter (Signed)
Wet Prep results are in the chart.  [facility] called.

## 2017-01-29 NOTE — Progress Notes (Signed)
Pt came into clinic today for test of cure from Trichomoniasis. Pt reports she completed her antibiotics and her husband was treated as well. Pt was able to provide wet prep and urine in office today. Advised we would contact her with results. No further questions.

## 2017-01-29 NOTE — Progress Notes (Signed)
Agree with above.  Giannis Corpuz, MD  

## 2017-01-30 LAB — GC/CHLAMYDIA PROBE AMP
CT PROBE, AMP APTIMA: NOT DETECTED
GC Probe RNA: NOT DETECTED

## 2017-01-30 LAB — URINE CULTURE: Organism ID, Bacteria: NO GROWTH

## 2017-01-30 LAB — HM MAMMOGRAPHY

## 2017-02-12 ENCOUNTER — Encounter: Payer: Self-pay | Admitting: Family Medicine

## 2017-02-16 ENCOUNTER — Other Ambulatory Visit: Payer: Self-pay | Admitting: Family Medicine

## 2017-03-12 ENCOUNTER — Telehealth: Payer: Self-pay | Admitting: Family Medicine

## 2017-03-12 MED ORDER — ESCITALOPRAM OXALATE 10 MG PO TABS: 10.0000 mg | ORAL_TABLET | Freq: Every day | ORAL | 0 refills | Status: DC

## 2017-03-12 NOTE — Telephone Encounter (Signed)
Pt advised. New Rx sent.  

## 2017-03-12 NOTE — Telephone Encounter (Signed)
OK to change to 10mg  and send 90 day supply.

## 2017-03-12 NOTE — Telephone Encounter (Signed)
Pt called clinic stating she is ready to taper back down to Lexapro 10mg  from the current 20mg  dose she is taking. Pt states "life has calmed down now." Pt would like to know how PCP wants her to do this. Routing to PCP for review.

## 2017-03-25 ENCOUNTER — Other Ambulatory Visit: Payer: Self-pay | Admitting: Family Medicine

## 2017-04-24 ENCOUNTER — Other Ambulatory Visit: Payer: Self-pay | Admitting: Family Medicine

## 2017-04-28 ENCOUNTER — Other Ambulatory Visit: Payer: Self-pay | Admitting: Family Medicine

## 2017-04-30 ENCOUNTER — Other Ambulatory Visit: Payer: Self-pay | Admitting: *Deleted

## 2017-04-30 MED ORDER — ESCITALOPRAM OXALATE 10 MG PO TABS: 10.0000 mg | ORAL_TABLET | Freq: Every day | ORAL | 1 refills | Status: DC

## 2017-05-10 ENCOUNTER — Ambulatory Visit (INDEPENDENT_AMBULATORY_CARE_PROVIDER_SITE_OTHER): Payer: Medicare Other

## 2017-05-10 ENCOUNTER — Ambulatory Visit (INDEPENDENT_AMBULATORY_CARE_PROVIDER_SITE_OTHER): Payer: Medicare Other | Admitting: Family Medicine

## 2017-05-10 ENCOUNTER — Encounter: Payer: Self-pay | Admitting: Family Medicine

## 2017-05-10 VITALS — BP 120/65 | HR 86 | Ht 66.0 in | Wt 144.0 lb

## 2017-05-10 DIAGNOSIS — F339 Major depressive disorder, recurrent, unspecified: Secondary | ICD-10-CM | POA: Diagnosis not present

## 2017-05-10 DIAGNOSIS — G8929 Other chronic pain: Secondary | ICD-10-CM | POA: Diagnosis not present

## 2017-05-10 DIAGNOSIS — M25512 Pain in left shoulder: Secondary | ICD-10-CM

## 2017-05-10 NOTE — Progress Notes (Signed)
Subjective:    Patient ID: Carolyn Hendrix, female    DOB: 1941-05-26, 76 y.o.   MRN: [id number]  HPI 76 year old female is here today to follow-up for depression. She says overall she is doing okay. She actually remarried a couple years ago and ever since then the relationship has been up and down. She says they formally separated about a week and a half ago and she overall is really quite at peace with it. She says she is just tired of the trauma from his family and is ready to try to move on. She is currently on Lexapro. We have dropped her back down to 10 mg recently and she says she's been doing fine on that.  Left shoulder pain - she continues to have some left shoulder pain. She's been trying to do home exercises and it's really not getting better. She mostly points to the outside portion of the shoulder which is where it's painful. She notices it more when she tries to reach back. She has difficulty doing that. Really taking any medication specifically for it but she actually does take ibuprofen and Tylenol daily for her back and says it really hasn't made a difference in her pain levels. No known injury or trauma per se.  Review of Systems  BP 120/65   Pulse 86   Ht 5\' 6"  (1.676 m)   Wt 144 lb (65.3 kg)   BMI 23.24 kg/m     Allergies  Allergen Reactions  . Buspar [Buspirone] Other (See Comments)    Dizziness   . Cymbalta [Duloxetine Hcl] Diarrhea  . Morphine     Past Medical History:  Diagnosis Date  . Chronic LBP    Lumbar DDD  . Colitis, ischemic (HCC)   . Depression   . Endometriosis   . GERD (gastroesophageal reflux disease)   . Hematuria   . Hyperlipidemia   . IBS (irritable bowel syndrome)    Fr Hurrelbrink- on Miralax and Citrucel daily  . Migraines   . MVP (mitral valve prolapse)     Past Surgical History:  Procedure Laterality Date  . ABDOMINAL HYSTERECTOMY  1991   w/ bilat oophorectomy for endometriosis  . APPENDECTOMY    .  ESOPHAGOGASTRODUODENOSCOPY  06  . HEMILAMINOTOMY LUMBAR SPINE  11/18/1991   Dr. Lorain Childes  . lapartomy  1995  . OTHER SURGICAL HISTORY  1992, 1994  . right breast biopsy- benign    . SPINAL FUSION  08/18/97   Dr. Dorita Fray at [facility], L4-5 post fusion with iliac crest bone graft  . TONSILLECTOMY AND ADENOIDECTOMY      Social History   Social History  . Marital status: Widowed    Spouse name: N/A  . Number of children: N/A  . Years of education: N/A   Occupational History  . Not on file.   Social History Main Topics  . Smoking status: Former Smoker    Types: Cigarettes    Quit date: 10/23/1980  . Smokeless tobacco: Never Used  . Alcohol use No  . Drug use: No  . Sexual activity: Not on file   Other Topics Concern  . Not on file   Social History Narrative  . No narrative on file    Family History  Problem Relation Age of Onset  . Alzheimer's disease Mother   . Other Father        ACS? - 80's  . Ataxia Sister     Outpatient Encounter Prescriptions as of 05/10/2017  Medication Sig  . dicyclomine (BENTYL) 10 MG capsule TAKE ONE CAPSULE 3 TIMES A DAY AS NEEDED SPASMS  . escitalopram (LEXAPRO) 10 MG tablet Take 1 tablet (10 mg total) by mouth daily.  Marland Kitchen. omeprazole (PRILOSEC) 20 MG capsule TAKE ONE CAPSULE EVERY DAY  . pravastatin (PRAVACHOL) 40 MG tablet TAKE 1 TABLET (40 MG TOTAL) BY MOUTH AT BEDTIME.  . traMADol (ULTRAM) 50 MG tablet TAKE 1 TO 2 TABLETS BY MOUTH 3 TIMES A DAY AS NEEDED FOR PAIN  . traZODone (DESYREL) 100 MG tablet TAKE 1 TABLET (100 MG TOTAL) BY MOUTH AT BEDTIME.  Marland Kitchen. TURMERIC PO Take by mouth daily.  . [DISCONTINUED] HYDROcodone-acetaminophen (NORCO) 10-325 MG per tablet Take 1 tablet by mouth. Take 1-2 tablets by mouth daily   No facility-administered encounter medications on file as of 05/10/2017.           Objective:   Physical Exam  Constitutional: She is oriented to person, place, and time. She appears well-developed and well-nourished.   HENT:  Head: Normocephalic and atraumatic.  Cardiovascular: Normal rate, regular rhythm and normal heart sounds.   Pulmonary/Chest: Effort normal and breath sounds normal.  Musculoskeletal:  Left shoulder with normal range of motion that she has pain with full extension. Negative empty can test. That she is weak with trying to do the lift off test off her low back. Normal external rotation of both shoulders.  Neurological: She is alert and oriented to person, place, and time.  Skin: Skin is warm and dry.  Psychiatric: She has a normal mood and affect. Her behavior is normal.          Assessment & Plan:  Depression-PHQ 9 score of 0. We'll continue with current regimen of Lexapro. Follow-up in 6 months.   Left shoulder pain-since she is not improving on her own with anti-inflammatories and home stretches and will get an x-ray for further evaluation. And go ahead and refer her to one of our sports medicine providers. This on her exam suspect weakness or tear in the supraspinatus.

## 2017-05-11 ENCOUNTER — Ambulatory Visit (INDEPENDENT_AMBULATORY_CARE_PROVIDER_SITE_OTHER): Payer: Medicare Other | Admitting: Family Medicine

## 2017-05-11 ENCOUNTER — Encounter: Payer: Self-pay | Admitting: Family Medicine

## 2017-05-11 DIAGNOSIS — M12812 Other specific arthropathies, not elsewhere classified, left shoulder: Secondary | ICD-10-CM | POA: Insufficient documentation

## 2017-05-11 DIAGNOSIS — M75102 Unspecified rotator cuff tear or rupture of left shoulder, not specified as traumatic: Secondary | ICD-10-CM | POA: Insufficient documentation

## 2017-05-11 DIAGNOSIS — M7582 Other shoulder lesions, left shoulder: Secondary | ICD-10-CM | POA: Diagnosis not present

## 2017-05-11 MED ORDER — DICLOFENAC SODIUM 1 % TD GEL: 4.0000 g | Freq: Four times a day (QID) | TRANSDERMAL | 11 refills | Status: DC

## 2017-05-11 NOTE — Progress Notes (Signed)
Subjective:    I'm seeing this patient as a consultation for:  Carolyn Hendrix, Carolyn D, MD   CC: Left shoulder pain  HPI: Patient notes he is near your long history of left shoulder pain. Patient has been worsening recently. She notes pain in the left lateral upper arm worse with overhead motion reaching back. She denies any significant radiating pain or weakness. She denies any injury. The pain is of noxious as it interferes with her sleep and her ability to dress herself normally. She has tried some over-the-counter medicines for pain which helps a little. She has been doing home exercises which also have not helped. She was seen by her PCP yesterday who ordered a x-ray and asked her to see me today.  Past medical history, Surgical history, Family history not pertinant except as noted below, Social history, Allergies, and medications have been entered into the medical record, reviewed, and no changes needed.   Review of Systems: No headache, visual changes, nausea, vomiting, diarrhea, constipation, dizziness, abdominal pain, skin rash, fevers, chills, night sweats, weight loss, swollen lymph nodes, body aches, joint swelling, muscle aches, chest pain, shortness of breath, mood changes, visual or auditory hallucinations.   Objective:    Vitals:   05/11/17 1024  BP: 111/69  Pulse: 89   General: Well Developed, well nourished, and in no acute distress.  Neuro/Psych: Alert and oriented x3, extra-ocular muscles intact, able to move all 4 extremities, sensation grossly intact. Skin: Warm and dry, no rashes noted.  Respiratory: Not using accessory muscles, speaking in full sentences, trachea midline.  Cardiovascular: Pulses palpable, no extremity edema. Abdomen: Does not appear distended. MSK: Left shoulder: Normal-appearing Nontender. Range of motion: External rotation limited to 30 beyond neutral position (10 less than the contralateral right shoulder) Internal rotation limited to the  lumbar spine Abduction is full but painful arc. Negative drop arm sign. Strength slightly diminished 4/5 to abduction and external rotation normal internal rotation. Positive Hawkin's tests negative Neer's test. Positive empty can test. Negative Yergason's and speeds test.  Limited musculoskeletal ultrasound of the left shoulder: Acromioclavicular joint appears degenerative with mild effusion. Biceps tendon appears partially intact in the bicipital groove. Subscapularis tendon is degenerative but intact with no obvious full-thickness tears. Supraspinatus tendon is intact with hypoechoic change at the insertion onto the humeral head. There is hypoechoic change superior to the tendon consistent with subacromial bursitis. Infraspinatus tendon appears to be intact  No results found for this or any previous visit (from the past 24 hour(s)). Dg Shoulder Left  Result Date: 05/10/2017 CLINICAL DATA:  Initial evaluation for lateral left shoulder pain for 6 months. EXAM: LEFT SHOULDER - 2+ VIEW COMPARISON:  None. FINDINGS: No acute fracture or dislocation. Humeral head in normal line with the glenoid. Remote Hill-Sachs deformity noted at the superolateral aspect of the left humeral head. Degenerative changes noted about the inferior left glenohumeral joint. AC joint approximated. No periarticular calcification. Visualized left hemithorax is clear. IMPRESSION: 1. 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 2. Remote Hill-Sachs deformity at the superolateral left humeral head. Electronically Signed   By: Rise MuBenjamin  McClintock M.Hendrix.   On: 05/10/2017 14:42    Impression and Recommendations:    Assessment and Plan: 76 y.o. female with  Left shoulder pain. I believe the majority the pain is due to tendinopathy. DJD of the glenohumeral joint seen on x-ray is likely not a huge factor. We discussed options. Patient declines injection at this time. Plan for referral to  physical  therapy and trial of diclofenac gel. Recheck in 4 weeks. We'll consider subacromial injection at that time if not improving.   Orders Placed This Encounter  Procedures  . Ambulatory referral to Physical Therapy    Referral Priority:   Routine    Referral Type:   Physical Medicine    Referral Reason:   Specialty Services Required    Requested Specialty:   Physical Therapy   Meds ordered this encounter  Medications  . DISCONTD: diclofenac sodium (VOLTAREN) 1 % GEL    Sig: Apply 4 g topically 4 (four) times daily. To affected joint.    Dispense:  100 g    Refill:  11  . diclofenac sodium (VOLTAREN) 1 % GEL    Sig: Apply 4 g topically 4 (four) times daily. To affected joint.    Dispense:  100 g    Refill:  11    Discussed warning signs or symptoms. Please see discharge instructions. Patient expresses understanding.

## 2017-05-11 NOTE — Patient Instructions (Signed)
Thank you for coming in today. Attend PT.  Recheck in 4 weeks.  Use the voltaren gel.  If [facility] is expensive at [facility] then [facility] and [facility] $27 with the coupon.     Rotator Cuff Tendinitis Rotator cuff tendinitis is inflammation of the tough, cord-like bands that connect muscle to bone (tendons) in the rotator cuff. The rotator cuff includes all of the muscles and tendons that connect the arm to the shoulder. The rotator cuff holds the head of the upper arm bone (humerus) in the cup (fossa) of the shoulder blade (scapula). This condition can lead to a long-lasting (chronic) tear. The tear may be partial or complete. What are the causes? This condition is usually caused by overusing the rotator cuff. What increases the risk? This condition is more likely to develop in athletes and workers who frequently use their shoulder or reach over their heads. This can include activities such as:  Tennis.  Baseball or softball.  Swimming.  Construction work.  Painting.  What are the signs or symptoms? Symptoms of this condition include:  Pain spreading (radiating) from the shoulder to the upper arm.  Swelling and tenderness in front of the shoulder.  Pain when reaching, pulling, or lifting the arm above the head.  Pain when lowering the arm from above the head.  Minor pain in the shoulder when resting.  Increased pain in the shoulder at night.  Difficulty placing the arm behind the back.  How is this diagnosed? This condition is diagnosed with a medical history and physical exam. Tests may also be done, including:  X-rays.  MRI.  Ultrasounds.  CT or MR arthrogram. During this test, a contrast material is injected and then images are taken.  How is this treated? Treatment for this condition depends on the severity of the condition. In less severe cases, treatment may include:  Rest. This may be done with a sling that holds the shoulder still (immobilization).  Your health care provider may also recommend avoiding activities that involve lifting your arm over your head.  Icing the shoulder.  Anti-inflammatory medicines, such as aspirin or ibuprofen.  In more severe cases, treatment may include:  Physical therapy.  Steroid injections.  Surgery.  Follow these instructions at home: If you have a sling:  Wear the sling as told by your health care provider. Remove it only as told by your health care provider.  Loosen the sling if your fingers tingle, become numb, or turn cold and blue.  Keep the sling clean.  If the sling is not waterproof, do not let it get wet. Remove it, if allowed, or cover it with a watertight covering when you take a bath or shower. Managing pain, stiffness, and swelling  If directed, put ice on the injured area. ? If you have a removable sling, remove it as told by your health care provider. ? Put ice in a plastic bag. ? Place a towel between your skin and the bag. ? Leave the ice on for 20 minutes, 2-3 times a day.  Move your fingers often to avoid stiffness and to lessen swelling.  Raise (elevate) the injured area above the level of your heart while you are lying down.  Find a comfortable sleeping position or sleep on a recliner, if available. Driving  Do not drive or use heavy machinery while taking prescription pain medicine.  Ask your health care provider when it is safe to drive if you have a sling  on your arm. Activity  Rest your shoulder as told by your health care provider.  Return to your normal activities as told by your health care provider. Ask your health care provider what activities are safe for you.  Do any exercises or stretches as told by your health care provider.  If you do repetitive overhead tasks, take small breaks in between and include stretching exercises as told by your health care provider. General instructions  Do not use any products that contain nicotine or tobacco, such  as cigarettes and e-cigarettes. These can delay healing. If you need help quitting, ask your health care provider.  Take over-the-counter and prescription medicines only as told by your health care provider.  Keep all follow-up visits as told by your health care provider. This is important. Contact a health care provider if:  Your pain gets worse.  You have new pain in your arm, hands, or fingers.  Your pain is not relieved with medicine or does not get better after 6 weeks of treatment.  You have cracking sensations when moving your shoulder in certain directions.  You hear a snapping sound after using your shoulder, followed by severe pain and weakness. Get help right away if:  Your arm, hand, or fingers are numb or tingling.  Your arm, hand, or fingers are swollen or painful or they turn white or blue. Summary  Rotator cuff tendinitis is inflammation of the tough, cord-like bands that connect muscle to bone (tendons) in the rotator cuff.  This condition is usually caused by overusing the rotator cuff, which includes all of the muscles and tendons that connect the arm to the shoulder.  This condition is more likely to develop in athletes and workers who frequently use their shoulder or reach over their heads.  Treatment generally includes rest, anti-inflammatory medicines, and icing. In some cases, physical therapy and steroid injections may be needed. In severe cases, surgery may be needed. This information is not intended to replace advice given to you by your health care provider. Make sure you discuss any questions you have with your health care provider. Document Released: 12/30/2003 Document Revised: 09/25/2016 Document Reviewed: 09/25/2016 Elsevier Interactive Patient Education  2017 [employer].

## 2017-05-22 ENCOUNTER — Other Ambulatory Visit: Payer: Self-pay | Admitting: Family Medicine

## 2017-05-23 ENCOUNTER — Encounter: Payer: Self-pay | Admitting: Rehabilitative and Restorative Service Providers"

## 2017-05-23 ENCOUNTER — Ambulatory Visit (INDEPENDENT_AMBULATORY_CARE_PROVIDER_SITE_OTHER): Payer: Medicare Other | Admitting: Rehabilitative and Restorative Service Providers"

## 2017-05-23 DIAGNOSIS — R293 Abnormal posture: Secondary | ICD-10-CM

## 2017-05-23 DIAGNOSIS — R29898 Other symptoms and signs involving the musculoskeletal system: Secondary | ICD-10-CM | POA: Diagnosis not present

## 2017-05-23 DIAGNOSIS — M25512 Pain in left shoulder: Secondary | ICD-10-CM

## 2017-05-23 NOTE — Patient Instructions (Addendum)
Self massage using a 3 inch plastic ball    Axial Extension (Chin Tuck)    Pull chin in and lengthen back of neck. Hold ___10_ seconds while counting out loud. Repeat __5__ times. Do __several __ sessions per day.  Shoulder Blade Squeeze    Rotate shoulders back, then squeeze shoulder blades down and back. Hold 10 sec Repeat ___10_ times. Do _several ___ sessions per day. Can use swim noodle for cues on posture    Scapula Adduction With Pectoralis Stretch: Low - Standing   Shoulders at 45 hands even with shoulders, keeping weight through legs, shift weight forward until you feel pull or stretch through the front of your chest. Hold _30__ seconds. Do _3__ times, _2-4__ times per day.   Scapula Adduction With Pectoralis Stretch: Mid-Range - Standing   Shoulders at 90 elbows even with shoulders, keeping weight through legs, shift weight forward until you feel pull or strength through the front of your chest. Hold __30_ seconds. Do _3__ times, __2-4_ times per day.   Scapula Adduction With Pectoralis Stretch: High - Standing   Shoulders at 120 hands up high on the doorway, keeping weight on feet, shift weight forward until you feel pull or stretch through the front of your chest. Hold _30__ seconds. Do _3__ times, _2-3__ times per day.   TENS UNIT: This is helpful for muscle pain and spasm.   Search and Purchase a TENS 7000 2nd edition at www.tenspros.com. It should be less than $30.     TENS unit instructions: Do not shower or bathe with the unit on Turn the unit off before removing electrodes or batteries If the electrodes lose stickiness add a drop of water to the electrodes after they are disconnected from the unit and place on plastic sheet. If you continued to have difficulty, call the TENS unit company to purchase more electrodes. Do not apply lotion on the skin area prior to use. Make sure the skin is clean and dry as this will help prolong the life of the  electrodes. After use, always check skin for unusual red areas, rash or other skin difficulties. If there are any skin problems, does not apply electrodes to the same area. Never remove the electrodes from the unit by pulling the wires. Do not use the TENS unit or electrodes other than as directed. Do not change electrode placement without consultating your therapist or physician. Keep 2 fingers with between each electrode.

## 2017-05-23 NOTE — Therapy (Signed)
[facility] [address] [place] [address] [place], [place], [address] Phone: [phone]   Fax:  [phone]  Physical Therapy Evaluation  Patient Details  Name: Carolyn Hendrix MRN: [id number] Date of Birth: 09/07/1941 Referring Provider: Dr Clementeen Graham  Encounter Date: 05/23/2017      PT End of Session - 05/23/17 1518    Visit Number 1   Number of Visits 12   Date for PT Re-Evaluation 07/04/17   PT Start Time 1514   PT Stop Time 1612   PT Time Calculation (min) 58 min   Activity Tolerance Patient tolerated treatment well      Past Medical History:  Diagnosis Date  . Chronic LBP    Lumbar DDD  . Colitis, ischemic (HCC)   . Depression   . Endometriosis   . GERD (gastroesophageal reflux disease)   . Hematuria   . Hyperlipidemia   . IBS (irritable bowel syndrome)    Fr Hurrelbrink- on Miralax and Citrucel daily  . Migraines   . MVP (mitral valve prolapse)     Past Surgical History:  Procedure Laterality Date  . ABDOMINAL HYSTERECTOMY  1991   w/ bilat oophorectomy for endometriosis  . APPENDECTOMY    . ESOPHAGOGASTRODUODENOSCOPY  06  . HEMILAMINOTOMY LUMBAR SPINE  11/18/1991   Dr. Lorain Childes  . lapartomy  1995  . OTHER SURGICAL HISTORY  1992, 1994  . right breast biopsy- benign    . SPINAL FUSION  08/18/97   Dr. Dorita Fray at [facility], L4-5 post fusion with iliac crest bone graft  . TONSILLECTOMY AND ADENOIDECTOMY      There were no vitals filed for this visit.       Subjective Assessment - 05/23/17 1522    Subjective Patient reports that she has been having Lt shoudler pain for at least 4 months. She was seen by MD and has done some exercises at home with no improvement. She was seen by Dr Denyse Amass with US showing rotator cuff tendonitis.    Pertinent History spinal cord stimulator for chronic LBP placed 10/16   Diagnostic tests xrays; [place]    Patient Stated Goals get rid of Lt shoudler pain    Currently in Pain? Yes   Pain Score 4    Pain Location Shoulder   Pain Orientation Left   Pain Descriptors / Indicators Aching   Pain Type Chronic pain   Pain Radiating Towards into upper arm not past elbow    Pain Onset More than a month ago   Pain Frequency Intermittent   Aggravating Factors  reaching back; reaching to the side and up; reaching behind back; some trouble at night - has to sleep on Lt side due to spinal stimulator    Pain Relieving Factors gel            [facility] PT Assessment - 05/23/17 0001      Assessment   Medical Diagnosis Lt rotator cuff tendonitis   Referring Provider Dr Clementeen Graham   Onset Date/Surgical Date 01/21/17   Hand Dominance Right   Next MD Visit 06/08/17   Prior Therapy none for shoulder      Precautions   Precautions None     Balance Screen   Has the patient fallen in the past 6 months No   Has the patient had a decrease in activity level because of a fear of falling?  No   Is the patient reluctant to leave their home because of a fear of  falling?  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    Leisure household chores; cooking      Sensation   Additional Comments WFL's UE's      Posture/Postural Control   Posture Comments head forward; shoulders rounded and elevated; head of the humerus anterior in orientation; scapulae abducted and rotated along the thoracic wall      AROM   Right Shoulder Extension 73 Degrees   Right Shoulder Flexion 147 Degrees   Right Shoulder ABduction 154 Degrees   Right Shoulder Internal Rotation 35 Degrees   Right Shoulder External Rotation 96 Degrees   Left Shoulder Extension 52 Degrees   Left Shoulder Flexion 125 Degrees   Left Shoulder ABduction 138 Degrees   Left Shoulder Internal Rotation 24 Degrees   Left Shoulder External Rotation 89 Degrees     Strength   Overall Strength Comments 5/5 Rt UE; 5/5 Lt shoulder except  flex; abd; ER; IR 4+/5 middle traps 4+/5 bilat      Palpation   Palpation comment muscular tightness Lt pecs; anterior shoudler; biceps; upper trap; leveator; teres             Objective measurements completed on examination: See above findings.          [facility] Adult PT Treatment/Exercise - 05/23/17 0001      Self-Care   Self-Care --  discussed sleeping positions/adhesive capsulitis      Therapeutic Activites    Therapeutic Activities --  myofacial ball release work Lt upper quarter     Neuro Re-ed    Neuro Re-ed Details  working on posture and alignment with focus on activation of posterior shoulder girdle pulling shoulders down and back      Shoulder Exercises: Standing   Other Standing Exercises scap squeeze 10 sec x 10 with swim noodle      Shoulder Exercises: Pulleys   Flexion --  10 sec x 5 reps      Shoulder Exercises: Stretch   Other Shoulder Stretches 3way doorway stretch 30 sec x 2 each position some discomfort in higher position but tolerated      Moist Heat Therapy   Number Minutes Moist Heat 20 Minutes   Moist Heat Location Shoulder  Rt     Electrical Stimulation   Electrical Stimulation Location Rt shoulder    Electrical Stimulation Action IFC   Electrical Stimulation Parameters to tolerance   Electrical Stimulation Goals Pain;Tone                PT Education - 05/23/17 1547    Education provided Yes   Education Details HEP postural correction    Person(s) Educated Patient   Methods Explanation;Demonstration;Tactile cues;Verbal cues;Handout   Comprehension Verbalized understanding;Returned demonstration;Verbal cues required;Tactile cues required             PT Long Term Goals - 05/23/17 1729      PT LONG TERM GOAL #1   Title Improve posture and alignment with patient to demonstrate improved upright posture with increased posterior shoulder girdle engagement 07/04/17   Time 6   Period Weeks   Status New     PT LONG TERM  GOAL #2   Title Increase AROM Lt shoulder to equal or greater that ARO Rt shoulder 07/04/17   Time 6   Period Weeks   Status New  PT LONG TERM GOAL #3   Title Return to normal functional activities with Lt UE  with minimal to no Lt shoulder pain 07/04/17   Time 6   Period Weeks     PT LONG TERM GOAL #4   Title Independent in HEP 07/04/17   Time 6   Period Weeks   Status New     PT LONG TERM GOAL #5   Title Improve FOTO to </= 34% limitation 07/04/17   Time 6   Period Weeks   Status New                Plan - 05/23/17 1715    Clinical Impression Statement Carolyn Hendrix presents with ~ 4 month history of Lt shoulder pain with no known injury. She has poor posture and alignment; limited shoulder ROM; Lt UE weakness; pain with palpation through the Lt shoulder girdle; decresed functional activity level. Patient will benefit form PT to address problems identified.    Clinical Presentation Evolving   Clinical Decision Making Low   Rehab Potential Good   PT Frequency 2x / week   PT Duration 6 weeks   PT Treatment/Interventions Patient/family education;ADLs/Self Care Home Management;Cryotherapy;Electrical Stimulation;Iontophoresis 4mg /ml Dexamethasone;Moist Heat;Ultrasound;Dry needling;Manual techniques;Therapeutic activities;Therapeutic exercise;Neuromuscular re-education   PT Next Visit Plan review HEP; work on posture and alignment; manual work through [employer] shoudler girdle; posterior shoulder girdle strengthening; modalities as indicated    Consulted and Agree with Plan of Care Patient      Patient will benefit from skilled therapeutic intervention in order to improve the following deficits and impairments:  Postural dysfunction, Improper body mechanics, Pain, Decreased range of motion, Increased fascial restricitons, Increased muscle spasms, Decreased mobility, Decreased strength, Decreased activity tolerance  Visit Diagnosis: Acute pain of left shoulder - Plan: PT plan of care  cert/re-cert  Other symptoms and signs involving the musculoskeletal system - Plan: PT plan of care cert/re-cert  Abnormal posture - Plan: PT plan of care cert/re-cert      [facility] - 05/23/17 1734    Functional Assessment Tool Used  Evaluation; FOTO; clinical assessment    Functional Limitations Carrying, moving and handling objects   Carrying, Moving and Handling Objects Current Status ([id number]) At least 40 percent but less than 60 percent impaired, limited or restricted   Carrying, Moving and Handling Objects Goal Status (U0454(G8985) At least 20 percent but less than 40 percent impaired, limited or restricted       Problem List Patient Active Problem List   Diagnosis Date Noted  . Rotator cuff tendonitis, left 05/11/2017  . Hyperlipidemia 11/10/2016  . Gluten intolerance 09/23/2013  . 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 Insomnia 03/18/2013  . OSTEOARTHRITIS, HIP, RIGHT 02/14/2010  . Anemia 06/02/2009  . 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 IRRITABLE BOWEL SYNDROME 06/02/2008  . Fatigue 06/02/2008  . GERD 03/30/2008  . DIVERTICULOSIS OF COLON 03/30/2008  . Major depression, recurrent, chronic (HCC) 02/17/2008  . ENDOMETRIOSIS 02/17/2008  . DISC DISEASE, LUMBAR 02/17/2008  . ISCHEMIC COLITIS, HX OF 02/17/2008    Celyn Rober MinionP Holt PT, MPH  05/23/2017, 5:36 PM  [facility] [address] [place], [place], [address] Phone: [phone]   Fax:  [phone]  Name: Carolyn Hendrix MRN: [id number] Date of Birth: 02/12/1941

## 2017-06-04 ENCOUNTER — Ambulatory Visit (INDEPENDENT_AMBULATORY_CARE_PROVIDER_SITE_OTHER): Payer: Medicare Other | Admitting: Rehabilitative and Restorative Service Providers"

## 2017-06-04 ENCOUNTER — Encounter: Payer: Self-pay | Admitting: Rehabilitative and Restorative Service Providers"

## 2017-06-04 DIAGNOSIS — R293 Abnormal posture: Secondary | ICD-10-CM | POA: Diagnosis not present

## 2017-06-04 DIAGNOSIS — M25512 Pain in left shoulder: Secondary | ICD-10-CM | POA: Diagnosis not present

## 2017-06-04 DIAGNOSIS — R29898 Other symptoms and signs involving the musculoskeletal system: Secondary | ICD-10-CM | POA: Diagnosis not present

## 2017-06-04 NOTE — Therapy (Signed)
[facility] [address] [place], [place], [address] Phone: [phone]   Fax:  [phone]  Physical Therapy Treatment  Patient Details  Name: Kathlene NovemberSandra R Joens MRN: [id number] Date of Birth: [birth year] Referring Provider: Dr Clementeen GrahamEvan Corey   Encounter Date: 06/04/2017      PT End of Session - 06/04/17 1105    Visit Number 2   Number of Visits 12   Date for PT Re-Evaluation 07/04/17   PT Start Time 1104   PT Stop Time 1157   PT Time Calculation (min) 53 min   Activity Tolerance Patient tolerated treatment well      Past Medical History:  Diagnosis Date  . Chronic LBP    Lumbar DDD  . Colitis, ischemic (HCC)   . Depression   . Endometriosis   . GERD (gastroesophageal reflux disease)   . Hematuria   . Hyperlipidemia   . IBS (irritable bowel syndrome)    Fr Hurrelbrink- on Miralax and Citrucel daily  . Migraines   . MVP (mitral valve prolapse)     Past Surgical History:  Procedure Laterality Date  . ABDOMINAL HYSTERECTOMY  1991   w/ bilat oophorectomy for endometriosis  . APPENDECTOMY    . ESOPHAGOGASTRODUODENOSCOPY  06  . HEMILAMINOTOMY LUMBAR SPINE  11/18/1991   Dr. Lorain ChildesWilliam Bostic  . lapartomy  1995  . OTHER SURGICAL HISTORY  1992, 1994  . right breast biopsy- benign    . SPINAL FUSION  08/18/97   [facility] L[facility]5 post fusion with iliac crest bone graft  . TONSILLECTOMY AND ADENOIDECTOMY      There were no vitals filed for this visit.      Subjective Assessment - 06/04/17 1108    Subjective Shoulder is feeling better with less pain and more movement. she has been doing her exercises - overdid them a little but has a better routine now.    Currently in Pain? No/denies            [facility] - 06/04/17 0001      Assessment   Medical Diagnosis Lt rotator cuff tendonitis   Referring Provider Dr Clementeen GrahamEvan Corey    Onset Date/Surgical Date 01/21/17   Hand Dominance Right   Next MD  Visit 06/08/17   Prior Therapy none for shoulder      AROM   Left Shoulder Extension 56 Degrees   Left Shoulder Flexion 134 Degrees   Left Shoulder ABduction 142 Degrees                     [facility] Adult PT Treatment/Exercise - 06/04/17 0001      Shoulder Exercises: Standing   Other Standing Exercises scap squeeze 10 sec x 10 with swim noodle; axial extension 10 sec x 5    Other Standing Exercises L's and W's x 10 each with noodle along spine      Shoulder Exercises: Pulleys   Flexion --  10 sec x 5 reps      Shoulder Exercises: Stretch   Other Shoulder Stretches 3way doorway stretch 30 sec x 3 each position some discomfort in higher position but tolerated      Moist Heat Therapy   Number Minutes Moist Heat 20 Minutes   Moist Heat Location Shoulder  Rt     Electrical Stimulation   Electrical Stimulation Location Rt shoulder    Electrical Stimulation Action IFC   Electrical Stimulation Parameters to tolerance   Electrical  Stimulation Goals Pain;Tone     Manual Therapy   Manual therapy comments pt supine   Joint Mobilization Lt glenohumeral joint    Soft tissue mobilization Lt shoulder girdle - pecs/upper trap/ leveator/ biceps/teres    Myofascial Release anterior Lt shoudler    Scapular Mobilization Lt                 PT Education - 06/04/17 1121    Education provided Yes   Education Details HEP    Person(s) Educated Patient   Methods Explanation;Demonstration;Tactile cues;Verbal cues;Handout   Comprehension Verbalized understanding;Returned demonstration;Verbal cues required;Tactile cues required             PT Long Term Goals - 06/04/17 1109      PT LONG TERM GOAL #1   Title Improve posture and alignment with patient to demonstrate improved upright posture with increased posterior shoulder girdle engagement 07/04/17   Time 6   Period Weeks   Status On-going     PT LONG TERM GOAL #2   Title Increase AROM Lt shoulder to equal or greater  that ARO Rt shoulder 07/04/17   Time 6   Period Weeks   Status On-going     PT LONG TERM GOAL #3   Title Return to normal functional activities with Lt UE  with minimal to no Lt shoulder pain 07/04/17   Time 6   Period Weeks   Status On-going     PT LONG TERM GOAL #4   Title Independent in HEP 07/04/17   Time 6   Period Weeks   Status On-going     PT LONG TERM GOAL #5   Title Improve FOTO to </= 34% limitation 07/04/17   Time 6   Period Weeks   Status On-going               Plan - 06/04/17 1112    Clinical Impression Statement Responding well to initial treatment and HEP. She has improved ROM and shoulder mobility with decreased pain today. Patient will benefit from continued PT to progress with stated goals of therapy.    Rehab Potential Good   PT Frequency 2x / week   PT Duration 6 weeks   PT Treatment/Interventions Patient/family education;ADLs/Self Care Home Management;Cryotherapy;Electrical Stimulation;Iontophoresis 4mg /ml Dexamethasone;Moist Heat;Ultrasound;Dry needling;Manual techniques;Therapeutic activities;Therapeutic exercise;Neuromuscular re-education   PT Next Visit Plan review HEP; work on posture and alignment; manual work through [employer] girdle; posterior shoulder girdle strengthening - add bands; modalities as indicated    Consulted and Agree with Plan of Care Patient      Patient will benefit from skilled therapeutic intervention in order to improve the following deficits and impairments:  Postural dysfunction, Improper body mechanics, Pain, Decreased range of motion, Increased fascial restricitons, Increased muscle spasms, Decreased mobility, Decreased strength, Decreased activity tolerance  Visit Diagnosis: Acute pain of left shoulder  Other symptoms and signs involving the musculoskeletal system  Abnormal posture     Problem List Patient Active Problem List   Diagnosis Date Noted  . Rotator cuff tendonitis, left 05/11/2017  .  Hyperlipidemia 11/10/2016  . Gluten intolerance 09/23/2013  . 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 Insomnia 03/18/2013  . OSTEOARTHRITIS, HIP, RIGHT 02/14/2010  . Anemia 06/02/2009  . 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 IRRITABLE BOWEL SYNDROME 06/02/2008  . Fatigue 06/02/2008  . GERD 03/30/2008  . DIVERTICULOSIS OF COLON 03/30/2008  . Major depression, recurrent, chronic (HCC) 02/17/2008  . ENDOMETRIOSIS 02/17/2008  . DISC DISEASE, LUMBAR 02/17/2008  .  ISCHEMIC COLITIS, HX OF 02/17/2008    Soha Thorup Rober Minion PT, MPH 06/04/2017, 11:42 AM  [facility] [address] [place], [place], [address] Phone: [phone]   Fax:  [phone]  Name: LILLYAN HITSON MRN: [id number] Date of Birth: [birth year]

## 2017-06-04 NOTE — Patient Instructions (Addendum)
Slide hands up to higher position on the doorway for doorway stretch   L's standing tall and straight   Scapular Retraction: Elbow Flexion (Standing)    With elbows bent to 90, pinch shoulder blades together and rotate arms out, keeping elbows bent. Repeat _10___ times per set.  Do __3-4 __ sessions per day.   SUPINE Tips A    Being in the supine position means to be lying on the back. Lying on the back is the position of least compression on the bones and discs of the spine, and helps to re-align the natural curves of the back. Start with 2-3 minutes and work toward 5 minutes NO PAIN - just a gentle stretch. Gradually slide arms up more toward your ears as the 5 minutes is easy

## 2017-06-07 ENCOUNTER — Ambulatory Visit (INDEPENDENT_AMBULATORY_CARE_PROVIDER_SITE_OTHER): Payer: Medicare Other | Admitting: Rehabilitative and Restorative Service Providers"

## 2017-06-07 ENCOUNTER — Other Ambulatory Visit: Payer: Self-pay

## 2017-06-07 ENCOUNTER — Encounter: Payer: Self-pay | Admitting: Rehabilitative and Restorative Service Providers"

## 2017-06-07 DIAGNOSIS — M25512 Pain in left shoulder: Secondary | ICD-10-CM

## 2017-06-07 DIAGNOSIS — R29898 Other symptoms and signs involving the musculoskeletal system: Secondary | ICD-10-CM

## 2017-06-07 DIAGNOSIS — R293 Abnormal posture: Secondary | ICD-10-CM | POA: Diagnosis not present

## 2017-06-07 MED ORDER — ESCITALOPRAM OXALATE 10 MG PO TABS: 10.0000 mg | ORAL_TABLET | Freq: Every day | ORAL | 0 refills | Status: DC

## 2017-06-07 NOTE — Therapy (Addendum)
[place], [place], [address] Phone: [phone]   Fax:  [phone]  Physical Therapy Treatment  Patient Details  Name: Carolyn Hendrix MRN: [id number] Date of Birth: Jul 20, 1941 Referring Provider: Dr Lynne Leader  Encounter Date: 06/07/2017      PT End of Session - 06/07/17 1109    Visit Number 3   Number of Visits 12   Date for PT Re-Evaluation 07/04/17   PT Start Time 1103   PT Stop Time 1155   PT Time Calculation (min) 52 min   Activity Tolerance Patient tolerated treatment well      Past Medical History:  Diagnosis Date  . Chronic LBP    Lumbar DDD  . Colitis, ischemic ([place])   . Depression   . Endometriosis   . GERD (gastroesophageal reflux disease)   . Hematuria   . Hyperlipidemia   . IBS (irritable bowel syndrome)    Fr Hurrelbrink- on Miralax and Citrucel daily  . Migraines   . MVP (mitral valve prolapse)     Past Surgical History:  Procedure Laterality Date  . ABDOMINAL HYSTERECTOMY  1991   w/ bilat oophorectomy for endometriosis  . APPENDECTOMY    . ESOPHAGOGASTRODUODENOSCOPY  06  . HEMILAMINOTOMY LUMBAR SPINE  11/18/1991   Dr. Billey Chang  . lapartomy  1995  . OTHER SURGICAL HISTORY  1992, 1994  . right breast biopsy- benign    . SPINAL FUSION  08/18/97   Dr. Rowe Pavy at [facility], L4-5 post fusion with iliac crest bone graft  . TONSILLECTOMY AND ADENOIDECTOMY      There were no vitals filed for this visit.      Subjective Assessment - 06/07/17 1109    Subjective Shoulder was flared up yesterday but is feeling some better today. She had a massage yesterday and that helped a lot. The massage therapist could tell her shoulder is loosening up. Carolyn Hendrix reports overall improvement.    Currently in Pain? No/denies            [facility] PT Assessment - 06/07/17 0001      Assessment   Medical Diagnosis Lt rotator cuff tendonitis   Referring Provider Dr Lynne Leader   Onset Date/Surgical Date 01/21/17   Hand Dominance Right   Next MD Visit 06/08/17   Prior Therapy none for shoulder      AROM   Right Shoulder Extension 73 Degrees   Right Shoulder Flexion 147 Degrees   Right Shoulder ABduction 154 Degrees   Right Shoulder Internal Rotation 35 Degrees   Right Shoulder External Rotation 96 Degrees   Left Shoulder Extension 56 Degrees   Left Shoulder Flexion 134 Degrees   Left Shoulder ABduction 142 Degrees   Left Shoulder Internal Rotation 24 Degrees   Left Shoulder External Rotation 89 Degrees     Palpation   Palpation comment decreasing muscular tightness Lt pecs; anterior shoudler; biceps; upper trap; leveator; teres                      [facility] Adult PT Treatment/Exercise - 06/07/17 0001      Shoulder Exercises: Standing   Extension Strengthening;Both;20 reps;Theraband   Theraband Level (Shoulder Extension) Level 1 (Yellow)   Row Strengthening;Both;20 reps;Theraband   Theraband Level (Shoulder Row) Level 1 (Yellow)   Retraction Strengthening;Both;20 reps;Theraband   Theraband Level (Shoulder Retraction) Level 1 (Yellow)   Other Standing Exercises scap squeeze 10 sec x 10 with swim noodle; axial  extension 10 sec x 5    Other Standing Exercises L's and W's x 10 each with noodle along spine      Shoulder Exercises: Pulleys   Flexion --  10 sec x 5 reps    ABduction --  10 sec hold x 10 reps scaption      Shoulder Exercises: Stretch   Other Shoulder Stretches 3way doorway stretch 30 sec x 3 each position some discomfort in higher position but tolerated      Moist Heat Therapy   Number Minutes Moist Heat 20 Minutes   Moist Heat Location Shoulder  Rt     Electrical Stimulation   Electrical Stimulation Location Rt shoulder    Electrical Stimulation Action IFC   Electrical Stimulation Parameters to tolerance   Electrical Stimulation Goals Pain;Tone     Manual Therapy   Manual therapy comments pt supine   Joint Mobilization  Lt glenohumeral joint    Soft tissue mobilization Lt shoulder girdle - pecs/upper trap/ leveator/ biceps/teres    Myofascial Release anterior Lt shoudler    Scapular Mobilization Lt                 PT Education - 06/07/17 1120    Education provided Yes   Education Details HEP   Person(s) Educated Patient   Methods Explanation;Demonstration;Tactile cues;Verbal cues;Handout   Comprehension Verbalized understanding;Returned demonstration;Verbal cues required;Tactile cues required             PT Long Term Goals - 06/04/17 1109      PT LONG TERM GOAL #1   Title Improve posture and alignment with patient to demonstrate improved upright posture with increased posterior shoulder girdle engagement 07/04/17   Time 6   Period Weeks   Status On-going     PT LONG TERM GOAL #2   Title Increase AROM Lt shoulder to equal or greater that ARO Rt shoulder 07/04/17   Time 6   Period Weeks   Status On-going     PT LONG TERM GOAL #3   Title Return to normal functional activities with Lt UE  with minimal to no Lt shoulder pain 07/04/17   Time 6   Period Weeks   Status On-going     PT LONG TERM GOAL #4   Title Independent in HEP 07/04/17   Time 6   Period Weeks   Status On-going     PT LONG TERM GOAL #5   Title Improve FOTO to </= 34% limitation 07/04/17   Time 6   Period Weeks   Status On-going               Plan - 06/07/17 1112    Clinical Impression Statement Continued improvement overall but some flare up of discomfort yesterday which may be related to the activities of Lt shoulder. ROM is improving and tightness to palpation is decreasing. Carolyn Hendrix will benefit from continued therapy to accomplish goals and reach maximum rehab potential.    Rehab Potential Good   PT Frequency 2x / week   PT Duration 6 weeks   PT Treatment/Interventions Patient/family education;ADLs/Self Care Home Management;Cryotherapy;Electrical Stimulation;Iontophoresis 15m/ml Dexamethasone;Moist  Heat;Ultrasound;Dry needling;Manual techniques;Therapeutic activities;Therapeutic exercise;Neuromuscular re-education   PT Next Visit Plan work on posture and alignment; manual work through [employer]irdle; posterior shoulder girdle strengthening - add bands; modalities as indicated    Consulted and Agree with Plan of Care Patient      Patient will benefit from skilled therapeutic intervention in order to improve the following  deficits and impairments:  Postural dysfunction, Improper body mechanics, Pain, Decreased range of motion, Increased fascial restricitons, Increased muscle spasms, Decreased mobility, Decreased strength, Decreased activity tolerance  Visit Diagnosis: Acute pain of left shoulder  Other symptoms and signs involving the musculoskeletal system  Abnormal posture     Problem List Patient Active Problem List   Diagnosis Date Noted  . Rotator cuff tendonitis, left 05/11/2017  . Hyperlipidemia 11/10/2016  . Gluten intolerance 09/23/2013  . 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 Insomnia 03/18/2013  . OSTEOARTHRITIS, HIP, RIGHT 02/14/2010  . Anemia 06/02/2009  . 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 IRRITABLE BOWEL SYNDROME 06/02/2008  . Fatigue 06/02/2008  . GERD 03/30/2008  . DIVERTICULOSIS OF COLON 03/30/2008  . Major depression, recurrent, chronic ([place]) 02/17/2008  . ENDOMETRIOSIS 02/17/2008  . [place] DISEASE, LUMBAR 02/17/2008  . ISCHEMIC COLITIS, HX OF 02/17/2008    Carolyn Hendrix PT, MPH  06/07/2017, 11:50 AM  [facility] [place], [place], [address] Phone: [phone]   Fax:  [phone]  Name: Carolyn Hendrix MRN: [id number] Date of Birth: 1941/01/23  PHYSICAL THERAPY DISCHARGE SUMMARY  Visits from Start of Care: 3  Current functional level related to goals / functional outcomes: See last progress note for discharge status   Remaining deficits: Unknown    Education  / Equipment: HEP Plan: Patient agrees to discharge.  Patient goals were partially met. Patient is being discharged due to the patient's request.  ?????     Yaritzy Huser P. Helene Kelp PT, MPH 07/13/17 2:11 PM

## 2017-06-07 NOTE — Patient Instructions (Signed)
Resisted External Rotation: in Neutral - Bilateral   PALMS UP Sit or stand, tubing in both hands, elbows at sides, bent to 90, forearms forward. Pinch shoulder blades together and rotate forearms out. Keep elbows at sides. Repeat __10__ times per set. Do _2-3___ sets per session. Do _2-3___ sessions per day.   Low Row: Standing   Face anchor, feet shoulder width apart. Palms up, pull arms back, squeezing shoulder blades together. Repeat 10__ times per set. Do 2-3__ sets per session. Do 2-3__ sessions per day. Anchor Height: Waist     Strengthening: Resisted Extension   Hold tubing in right hand, arm forward. Pull arm back, elbow straight. Repeat _10___ times per set. Do 2-3____ sets per session. Do 2-3____ sessions per day.   

## 2017-06-07 NOTE — Telephone Encounter (Signed)
Patient request refill for Escitalopram 10 mg. #[phone] refills sent to [facility]. Patient advised that appointment is needed for further refills. Rhonda Cunningham,CMA

## 2017-06-08 ENCOUNTER — Encounter: Payer: Self-pay | Admitting: Family Medicine

## 2017-06-08 ENCOUNTER — Ambulatory Visit (INDEPENDENT_AMBULATORY_CARE_PROVIDER_SITE_OTHER): Payer: Medicare Other | Admitting: Family Medicine

## 2017-06-08 VITALS — BP 112/69 | HR 100 | Wt 145.0 lb

## 2017-06-08 DIAGNOSIS — M7582 Other shoulder lesions, left shoulder: Secondary | ICD-10-CM | POA: Diagnosis not present

## 2017-06-08 NOTE — Progress Notes (Signed)
Carolyn Hendrix is a 76 y.o. female who presents to [facility] today for left shoulder follow-up. Patient was seen about a month ago for left shoulder pain thought to be due to rotator cuff tendinopathy. She's had physical therapy since and has done much better. She notes her pain is improved but not completely resolved. She is very happy with how physical therapy has done. She denies any radiating pain weakness or numbness.   Past Medical History:  Diagnosis Date  . Chronic LBP    Lumbar DDD  . Colitis, ischemic ([facility])   . Depression   . Endometriosis   . GERD (gastroesophageal reflux disease)   . Hematuria   . Hyperlipidemia   . IBS (irritable bowel syndrome)    Fr Hurrelbrink- on Miralax and Citrucel daily  . Migraines   . MVP (mitral valve prolapse)    Past Surgical History:  Procedure Laterality Date  . ABDOMINAL HYSTERECTOMY  1991   w/ bilat oophorectomy for endometriosis  . APPENDECTOMY    . ESOPHAGOGASTRODUODENOSCOPY  06  . HEMILAMINOTOMY LUMBAR SPINE  11/18/1991   Dr. Lorain Childes  . lapartomy  1995  . OTHER SURGICAL HISTORY  1992, 1994  . right breast biopsy- benign    . SPINAL FUSION  08/18/97   Dr. Dorita Fray at [facility], L4-5 post fusion with iliac crest bone graft  . TONSILLECTOMY AND ADENOIDECTOMY     Social History  Substance Use Topics  . Smoking status: Former Smoker    Types: Cigarettes    Quit date: 10/23/1980  . Smokeless tobacco: Never Used  . Alcohol use No     ROS:  As above   Medications: Current Outpatient Prescriptions  Medication Sig Dispense Refill  . diclofenac sodium (VOLTAREN) 1 % GEL Apply 4 g topically 4 (four) times daily. To affected joint. 100 g 11  . dicyclomine (BENTYL) 10 MG capsule TAKE ONE CAPSULE 3 TIMES A DAY AS NEEDED SPASMS 90 capsule 5  . escitalopram (LEXAPRO) 10 MG tablet Take 1 tablet (10 mg total) by mouth daily. APPOINTMENT NEEDED FOR FURTHER REFILLS 30 tablet 0  .  omeprazole (PRILOSEC) 20 MG capsule TAKE ONE CAPSULE EVERY DAY 90 capsule 3  . pravastatin (PRAVACHOL) 40 MG tablet TAKE 1 TABLET (40 MG TOTAL) BY MOUTH AT BEDTIME. 90 tablet 0  . traMADol (ULTRAM) 50 MG tablet TAKE 1 TO 2 TABLETS BY MOUTH 3 TIMES A DAY AS NEEDED FOR PAIN  1  . traZODone (DESYREL) 100 MG tablet TAKE 1 TABLET (100 MG TOTAL) BY MOUTH AT BEDTIME. 90 tablet 0  . TURMERIC PO Take by mouth daily.     No current facility-administered medications for this visit.    Allergies  Allergen Reactions  . Buspar [Buspirone] Other (See Comments)    Dizziness   . Cymbalta [Duloxetine Hcl] Diarrhea  . Morphine      Exam:  BP 112/69   Pulse 100   Wt 145 lb (65.8 kg)   BMI 23.40 kg/m  General: Well Developed, well nourished, and in no acute distress.  Neuro/Psych: Alert and oriented x3, extra-ocular muscles intact, able to move all 4 extremities, sensation grossly intact. Skin: Warm and dry, no rashes noted.  Respiratory: Not using accessory muscles, speaking in full sentences, trachea midline.  Cardiovascular: Pulses palpable, no extremity edema. Abdomen: Does not appear distended. MSK: Left shoulder normal-appearing nontender normal range of motion negative impingement testing normal strength.    No results found for  this or any previous visit (from the past 48 hour(s)). No results found.    Assessment and Plan: 76 y.o. female with left shoulder pain rotator cuff tendinopathy. Plan to continue physical therapy. Physical therapy has been reauthorized for 6 more weeks as needed.  Additionally we discussed influenza vaccine today. Patient will see The flu shot either with a nurse visit or at her pharmacy this fall.     Orders Placed This Encounter  Procedures  . Ambulatory referral to Physical Therapy    Referral Priority:   Routine    Referral Type:   Physical Medicine    Referral Reason:   Specialty Services Required    Requested Specialty:   Physical Therapy   No  orders of the defined types were placed in this encounter.   Discussed warning signs or symptoms. Please see discharge instructions. Patient expresses understanding.

## 2017-06-08 NOTE — Patient Instructions (Signed)
Thank you for coming in today. Attend PT.  Recheck as needed.  Get a flu vaccine this fall.

## 2017-06-13 ENCOUNTER — Encounter: Payer: Medicare Other | Admitting: Rehabilitative and Restorative Service Providers"

## 2017-06-18 ENCOUNTER — Other Ambulatory Visit: Payer: Self-pay | Admitting: Family Medicine

## 2017-06-20 ENCOUNTER — Telehealth: Payer: Self-pay | Admitting: Family Medicine

## 2017-06-20 NOTE — Telephone Encounter (Signed)
Patient called request to know if she can increase the Lexapro 10mg  she has already to 20mg . Patient states she needs an increase and Dr. Linford Arnold already knows what is going on with her situation. Adv will send a message. Pt states she has plenty of 10mg s

## 2017-06-20 NOTE — Telephone Encounter (Signed)
Dois DavenportSandra called and states she is having increase in stress. She would like to increase the Lexapro to 20 mg daily. Please advise.

## 2017-06-21 ENCOUNTER — Other Ambulatory Visit: Payer: Self-pay | Admitting: *Deleted

## 2017-06-21 MED ORDER — ESCITALOPRAM OXALATE 10 MG PO TABS: 20.0000 mg | ORAL_TABLET | Freq: Every day | ORAL | 1 refills | Status: DC

## 2017-06-21 NOTE — Telephone Encounter (Signed)
Ok to incresae to 20mg . Ok to send new Rx. For 90 day supply

## 2017-06-21 NOTE — Telephone Encounter (Signed)
Called pt back to adv of Dr. Norman HerrlichMetheneys response. Anne HahnVenice

## 2017-06-21 NOTE — Telephone Encounter (Signed)
rx sent.Carolyn Hendrix Lynetta  

## 2017-06-27 ENCOUNTER — Ambulatory Visit (INDEPENDENT_AMBULATORY_CARE_PROVIDER_SITE_OTHER): Payer: Medicare Other | Admitting: Family Medicine

## 2017-06-27 ENCOUNTER — Encounter: Payer: Self-pay | Admitting: Family Medicine

## 2017-06-27 ENCOUNTER — Ambulatory Visit: Payer: Medicare Other | Admitting: Family Medicine

## 2017-06-27 ENCOUNTER — Ambulatory Visit (INDEPENDENT_AMBULATORY_CARE_PROVIDER_SITE_OTHER): Payer: Medicare Other

## 2017-06-27 VITALS — BP 104/66 | HR 83 | Wt 145.9 lb

## 2017-06-27 VITALS — BP 99/53 | HR 96 | Temp 98.3°F

## 2017-06-27 DIAGNOSIS — S92154A Nondisplaced avulsion fracture (chip fracture) of right talus, initial encounter for closed fracture: Secondary | ICD-10-CM

## 2017-06-27 DIAGNOSIS — S99911A Unspecified injury of right ankle, initial encounter: Secondary | ICD-10-CM | POA: Diagnosis not present

## 2017-06-27 DIAGNOSIS — W19XXXA Unspecified fall, initial encounter: Secondary | ICD-10-CM | POA: Diagnosis not present

## 2017-06-27 DIAGNOSIS — S92151A Displaced avulsion fracture (chip fracture) of right talus, initial encounter for closed fracture: Secondary | ICD-10-CM | POA: Insufficient documentation

## 2017-06-27 DIAGNOSIS — S82891A Other fracture of right lower leg, initial encounter for closed fracture: Secondary | ICD-10-CM | POA: Diagnosis not present

## 2017-06-27 HISTORY — DX: Displaced avulsion fracture (chip fracture) of right talus, initial encounter for closed fracture: S92.151A

## 2017-06-27 NOTE — Patient Instructions (Signed)
Thank you for coming in today. Use the ankle brace with activity.  Do the exercises.  Recheck with me in 3 weeks or so.  Apply ice for about 20 mins 2x daily if you can.    Ankle Sprain, Phase I Rehab Ask your health care provider which exercises are safe for you. Do exercises exactly as told by your health care provider and adjust them as directed. It is normal to feel mild stretching, pulling, tightness, or discomfort as you do these exercises, but you should stop right away if you feel sudden pain or your pain gets worse.Do not begin these exercises until told by your health care provider. Stretching and range of motion exercises These exercises warm up your muscles and joints and improve the movement and flexibility of your lower leg and ankle. These exercises also help to relieve pain and stiffness. Exercise A: Gastroc and soleus stretch  1. Sit on the floor with your left / right leg extended. 2. Loop a belt or towel around the ball of your left / right foot. The ball of your foot is on the walking surface, right under your toes. 3. Keep your left / right ankle and foot relaxed and keep your knee straight while you use the belt or towel to pull your foot toward you. You should feel a gentle stretch behind your calf or knee. 4. Hold this position for __________ seconds, then release to the starting position. Repeat the exercise with your knee bent. You can put a pillow or a rolled bath towel under your knee to support it. You should feel a stretch deep in your calf or at your Achilles tendon. Repeat each stretch __________ times. Complete these stretches __________ times a day. Exercise B: Ankle alphabet  1. Sit with your left / right leg supported at the lower leg. ? Do not rest your foot on anything. ? Make sure your foot has room to move freely. 2. Think of your left / right foot as a paintbrush, and move your foot to trace each letter of the alphabet in the air. Keep your hip and  knee still while you trace. Make the letters as large as you can without feeling discomfort. 3. Trace every letter from A to Z. Repeat __________ times. Complete this exercise __________ times a day. Strengthening exercises These exercises build strength and endurance in your ankle and lower leg. Endurance is the ability to use your muscles for a long time, even after they get tired. Exercise C: Dorsiflexors  1. Secure a rubber exercise band or tube to an object, such as a table leg, that will stay still when the band is pulled. Secure the other end around your left / right foot. 2. Sit on the floor facing the object, with your left / right leg extended. The band or tube should be slightly tense when your foot is relaxed. 3. Slowly bring your foot toward you, pulling the band tighter. 4. Hold this position for __________ seconds. 5. Slowly return your foot to the starting position. Repeat __________ times. Complete this exercise __________ times a day. Exercise D: Plantar flexors  1. Sit on the floor with your left / right leg extended. 2. Loop a rubber exercise tube or band around the ball of your left / right foot. The ball of your foot is on the walking surface, right under your toes. ? Hold the ends of the band or tube in your hands. ? The band or tube should be  slightly tense when your foot is relaxed. 3. Slowly point your foot and toes downward, pushing them away from you. 4. Hold this position for __________ seconds. 5. Slowly return your foot to the starting position. Repeat __________ times. Complete this exercise __________ times a day. Exercise E: Evertors 1. Sit on the floor with your legs straight out in front of you. 2. Loop a rubber exercise band or tube around the ball of your left / right foot. The ball of your foot is on the walking surface, right under your toes. ? Hold the ends of the band in your hands, or secure the band to a stable object. ? The band or tube should  be slightly tense when your foot is relaxed. 3. Slowly push your foot outward, away from your other leg. 4. Hold this position for __________ seconds. 5. Slowly return your foot to the starting position. Repeat __________ times. Complete this exercise __________ times a day. This information is not intended to replace advice given to you by your health care provider. Make sure you discuss any questions you have with your health care provider. Document Released: 05/10/2005 Document Revised: 06/15/2016 Document Reviewed: 08/23/2015 Elsevier Interactive Patient Education  2018 [employer].

## 2017-06-27 NOTE — Progress Notes (Signed)
Carolyn Hendrix is a 76 y.o. female who presents to [facility] today for right ankle injury.   Carolyn Hendrix suffered a right ankle inversion injury yesterday. She was originally scheduled today for a nurse visit for a flu shot. However she would like to address her ankle and foot pain as well.  She notes pain and swelling. She's tried some ice and relative rest and has helped. No fevers chills nausea vomiting or diarrhea.   Past Medical History:  Diagnosis Date  . Chronic LBP    Lumbar DDD  . Colitis, ischemic (HCC)   . Depression   . Endometriosis   . GERD (gastroesophageal reflux disease)   . Hematuria   . Hyperlipidemia   . IBS (irritable bowel syndrome)    Fr Hurrelbrink- on Miralax and Citrucel daily  . Migraines   . MVP (mitral valve prolapse)    Past Surgical History:  Procedure Laterality Date  . ABDOMINAL HYSTERECTOMY  1991   w/ bilat oophorectomy for endometriosis  . APPENDECTOMY    . ESOPHAGOGASTRODUODENOSCOPY  06  . HEMILAMINOTOMY LUMBAR SPINE  11/18/1991   Dr. Lorain Childes  . lapartomy  1995  . OTHER SURGICAL HISTORY  1992, 1994  . right breast biopsy- benign    . SPINAL FUSION  08/18/97   Dr. Dorita Fray at [facility], L4-5 post fusion with iliac crest bone graft  . TONSILLECTOMY AND ADENOIDECTOMY     Social History  Substance Use Topics  . Smoking status: Former Smoker    Types: Cigarettes    Quit date: 10/23/1980  . Smokeless tobacco: Never Used  . Alcohol use No     ROS:  As above   Medications: Current Outpatient Prescriptions  Medication Sig Dispense Refill  . diclofenac sodium (VOLTAREN) 1 % GEL Apply 4 g topically 4 (four) times daily. To affected joint. 100 g 11  . dicyclomine (BENTYL) 10 MG capsule TAKE ONE CAPSULE 3 TIMES A DAY AS NEEDED SPASMS 90 capsule 5  . escitalopram (LEXAPRO) 20 MG tablet Take 20 mg by mouth daily.    Marland Kitchen omeprazole (PRILOSEC) 20 MG capsule TAKE ONE CAPSULE EVERY DAY 90 capsule 3    . pravastatin (PRAVACHOL) 40 MG tablet TAKE 1 TABLET (40 MG TOTAL) BY MOUTH AT BEDTIME. 90 tablet 0  . traMADol (ULTRAM) 50 MG tablet TAKE 1 TO 2 TABLETS BY MOUTH 3 TIMES A DAY AS NEEDED FOR PAIN  1  . traZODone (DESYREL) 100 MG tablet TAKE 1 TABLET (100 MG TOTAL) BY MOUTH AT BEDTIME. 90 tablet 0  . TURMERIC PO Take by mouth daily.     No current facility-administered medications for this visit.    Allergies  Allergen Reactions  . Buspar [Buspirone] Other (See Comments)    Dizziness   . Cymbalta [Duloxetine Hcl] Diarrhea  . Morphine      Exam:  BP (!) 99/53   Pulse 96   Temp 98.3 F (36.8 C)   SpO2 96%  General: Well Developed, well nourished, and in no acute distress.  Neuro/Psych: Alert and oriented x3, extra-ocular muscles intact, able to move all 4 extremities, sensation grossly intact. Skin: Warm and dry, no rashes noted.  Respiratory: Not using accessory muscles, speaking in full sentences, trachea midline.  Cardiovascular: Pulses palpable, no extremity edema. Abdomen: Does not appear distended. MSK: Left ankle swollen with some ecchymosis at the lateral anterior aspect of the ankle and foot. Minimally tender at the ATFL area. Stable ligamentous exam. Pulses  capillary refill and sensation are intact distally. Patient is able to ambulate without much pain.    No results found for this or any previous visit (from the past 48 hour(s)). Dg Ankle Complete Right  Result Date: 06/27/2017 CLINICAL DATA:  Fall.  Ankle pain and swelling EXAM: RIGHT ANKLE - COMPLETE 3+ VIEW COMPARISON:  None. FINDINGS: Avulsion fracture of the lateral aspect of the foot best seen on the lateral view. This may be the lateral calcaneus. Mild irregularity of the anterior talus which appears chronic. There is anterior and lateral soft tissue swelling Ankle joint intact. . IMPRESSION: Avulsion fracture of the lateral foot, most likely involving the calcaneus. Electronically Signed   By: Marlan Palauharles  Clark  M.D.   On: 06/27/2017 14:30   Dg Foot Complete Right  Result Date: 06/27/2017 CLINICAL DATA:  Anterior, lateral foot and ankle pain with bruising EXAM: RIGHT FOOT COMPLETE - 3+ VIEW COMPARISON:  None. FINDINGS: Lucency and cortical irregularity of the dorsal anterior talus with adjacent small bone fragment, consistent with cortical fracture. No subluxation. Mild degenerative changes at the first MTP joint. IMPRESSION: Lucency and cortical irregularity involving the dorsal, anterior aspect of talus, consistent with fracture. Electronically Signed   By: Jasmine PangKim  Fujinaga M.D.   On: 06/27/2017 14:30      Assessment and Plan: 76 y.o. female with avulsion fracture of the ATFL is consistent with the location of the fragment seen on x-ray.  Patient does not seem to be in enough pain to have a full through and through fracture of the talus or calcaneus. Will treat as an ankle sprain with relative rest and ASO brace. Recheck in 2 weeks.  Influenza vaccine today given prior to discharge.  Orders Placed This Encounter  Procedures  . DG Ankle Complete Right    Standing Status:   Future    Number of Occurrences:   1    Standing Expiration Date:   08/27/2018    Order Specific Question:   Reason for Exam (SYMPTOM  OR DIAGNOSIS REQUIRED)    Answer:   eval pain and swelling right ankle    Order Specific Question:   Preferred imaging location?    Answer[facility]   [facility]    Order Specific Question:   Radiology Contrast Protocol - do NOT remove file path    Answer:   \\[email or web address]s.pdf  . DG Foot Complete Right    Standing Status:   Future    Number of Occurrences:   1    Standing Expiration Date:   08/27/2018    Order Specific Question:   Reason for Exam (SYMPTOM  OR DIAGNOSIS REQUIRED)    Answer:   eval pain and swelling right foot and ankle after injury    Order Specific Question:   Preferred imaging location?    Answer:   [facility]    Order  Specific Question:   Radiology Contrast Protocol - do NOT remove file path    Answer:   \\[email or web address]s.[email or web address]   Meds ordered this encounter  Medications  . escitalopram (LEXAPRO) 20 MG tablet    Sig: Take 20 mg by mouth daily.    Discussed warning signs or symptoms. Please see discharge instructions. Patient expresses understanding.

## 2017-06-27 NOTE — Progress Notes (Signed)
Pt is here for a flu shot. Immunization given, pt tolerated well. While she was here she informed me that she had fallen. I had Dr. Denyse Amassorey look at her and he will see her today.[name], Carolyn Hendrix Lynetta '

## 2017-06-27 NOTE — Progress Notes (Signed)
Note duplication 

## 2017-06-28 ENCOUNTER — Ambulatory Visit: Payer: Medicare Other | Admitting: Family Medicine

## 2017-07-08 ENCOUNTER — Other Ambulatory Visit: Payer: Self-pay | Admitting: Family Medicine

## 2017-07-18 ENCOUNTER — Encounter: Payer: Self-pay | Admitting: Family Medicine

## 2017-07-18 ENCOUNTER — Ambulatory Visit (INDEPENDENT_AMBULATORY_CARE_PROVIDER_SITE_OTHER): Payer: Medicare Other | Admitting: Family Medicine

## 2017-07-18 VITALS — BP 115/67 | HR 80 | Wt 148.0 lb

## 2017-07-18 DIAGNOSIS — S92154A Nondisplaced avulsion fracture (chip fracture) of right talus, initial encounter for closed fracture: Secondary | ICD-10-CM

## 2017-07-18 DIAGNOSIS — M7582 Other shoulder lesions, left shoulder: Secondary | ICD-10-CM

## 2017-07-18 NOTE — Patient Instructions (Signed)
Thank you for coming in today. Continue the home exercises for both the shoulder and elbow.  Recheck as needed.  If not a lot better in 3-4 weeks return and we will repeat xray at that visit.

## 2017-07-18 NOTE — Progress Notes (Signed)
Carolyn Hendrix is a 76 y.o. female who presents to [facility] today for follow-up right ankle sprain and a left shoulder pain.  Right ankle sprain: Patient was seen on September 5 for right ankle sprain with avulsion fracture from the ATFL on the talus. She is doing well with minimal to absent pain. She no longer is using an ankle brace and is ambulating normally. She's feeling great.  Left shoulder: Patient has been seen several times for left shoulder pain thought to be related to adhesive capsulitis with some rotator cuff dysfunction. She's attended physical therapy in the past and done well. She is doing well and had a resurgence of pain when she increased her activity. She has restarted her home exercise program is starting to feel better now.   Past Medical History:  Diagnosis Date  . Chronic LBP    Lumbar DDD  . Colitis, ischemic (HCC)   . Depression   . Endometriosis   . GERD (gastroesophageal reflux disease)   . Hematuria   . Hyperlipidemia   . IBS (irritable bowel syndrome)    Fr Hurrelbrink- on Miralax and Citrucel daily  . Migraines   . MVP (mitral valve prolapse)    Past Surgical History:  Procedure Laterality Date  . ABDOMINAL HYSTERECTOMY  1991   w/ bilat oophorectomy for endometriosis  . APPENDECTOMY    . ESOPHAGOGASTRODUODENOSCOPY  06  . HEMILAMINOTOMY LUMBAR SPINE  11/18/1991   Dr. Lorain Childes  . lapartomy  1995  . OTHER SURGICAL HISTORY  1992, 1994  . right breast biopsy- benign    . SPINAL FUSION  08/18/97   Dr. Dorita Fray at [facility], L4-5 post fusion with iliac crest bone graft  . TONSILLECTOMY AND ADENOIDECTOMY     Social History  Substance Use Topics  . Smoking status: Former Smoker    Types: Cigarettes    Quit date: 10/23/1980  . Smokeless tobacco: Never Used  . Alcohol use No     ROS:  As above   Medications: Current Outpatient Prescriptions  Medication Sig Dispense Refill  . diclofenac  sodium (VOLTAREN) 1 % GEL Apply 4 g topically 4 (four) times daily. To affected joint. 100 g 11  . dicyclomine (BENTYL) 10 MG capsule TAKE ONE CAPSULE 3 TIMES A DAY AS NEEDED SPASMS 90 capsule 5  . escitalopram (LEXAPRO) 20 MG tablet Take 20 mg by mouth daily.    Marland Kitchen omeprazole (PRILOSEC) 20 MG capsule TAKE ONE CAPSULE EVERY DAY 90 capsule 3  . pravastatin (PRAVACHOL) 40 MG tablet TAKE 1 TABLET (40 MG TOTAL) BY MOUTH AT BEDTIME. 90 tablet 0  . traMADol (ULTRAM) 50 MG tablet TAKE 1 TO 2 TABLETS BY MOUTH 3 TIMES A DAY AS NEEDED FOR PAIN  1  . traZODone (DESYREL) 100 MG tablet TAKE 1 TABLET (100 MG TOTAL) BY MOUTH AT BEDTIME. 90 tablet 0  . TURMERIC PO Take by mouth daily.     No current facility-administered medications for this visit.    Allergies  Allergen Reactions  . Buspar [Buspirone] Other (See Comments)    Dizziness   . Cymbalta [Duloxetine Hcl] Diarrhea  . Morphine      Exam:  BP 115/67   Pulse 80   Wt 148 lb (67.1 kg)   BMI 23.89 kg/m  General: Well Developed, well nourished, and in no acute distress.  Neuro/Psych: Alert and oriented x3, extra-ocular muscles intact, able to move all 4 extremities, sensation grossly intact. Skin: Warm  and dry, no rashes noted.  Respiratory: Not using accessory muscles, speaking in full sentences, trachea midline.  Cardiovascular: Pulses palpable, no extremity edema. Abdomen: Does not appear distended. MSK:  Ankle normal-appearing nontender normal motion stable ligamentous exam. Left shoulder normal appearing normal motion nontender.    No results found for this or any previous visit (from the past 48 hour(s)). No results found.    Assessment and Plan: 76 y.o. female with  Right ankle sprain with avulsion fracture. Doing well. Patient is essentially asymptomatic and has a normal physical exam. I don't see the need for further x-rays at this point as long as she is doing well. If not continuing to improve in a month will return to  clinic and repeat x-rays.  Left shoulder pain: Also doing well plan to continue home exercise program. If patient continues to relapse will proceed with injection versus referral to physical therapy.    No orders of the defined types were placed in this encounter.  No orders of the defined types were placed in this encounter.   Discussed warning signs or symptoms. Please see discharge instructions. Patient expresses understanding.

## 2017-08-07 ENCOUNTER — Other Ambulatory Visit: Payer: Self-pay | Admitting: *Deleted

## 2017-08-07 MED ORDER — DICYCLOMINE HCL 20 MG PO TABS: 10.0000 mg | ORAL_TABLET | Freq: Three times a day (TID) | ORAL | 0 refills | Status: DC

## 2017-08-07 NOTE — Progress Notes (Signed)
10 mg on backorder. 20 mg sent.Loralee Pacas [place]

## 2017-09-10 ENCOUNTER — Other Ambulatory Visit: Payer: Self-pay | Admitting: Family Medicine

## 2017-11-04 ENCOUNTER — Other Ambulatory Visit: Payer: Self-pay | Admitting: Family Medicine

## 2017-11-12 ENCOUNTER — Ambulatory Visit: Payer: Medicare Other | Admitting: Family Medicine

## 2017-11-15 ENCOUNTER — Ambulatory Visit: Payer: Medicare Other | Admitting: Family Medicine

## 2017-11-15 ENCOUNTER — Encounter: Payer: Self-pay | Admitting: Family Medicine

## 2017-11-15 VITALS — BP 130/63 | HR 88 | Ht 66.0 in | Wt 145.0 lb

## 2017-11-15 DIAGNOSIS — F339 Major depressive disorder, recurrent, unspecified: Secondary | ICD-10-CM | POA: Diagnosis not present

## 2017-11-15 DIAGNOSIS — K582 Mixed irritable bowel syndrome: Secondary | ICD-10-CM | POA: Diagnosis not present

## 2017-11-15 DIAGNOSIS — R5383 Other fatigue: Secondary | ICD-10-CM

## 2017-11-15 DIAGNOSIS — E7800 Pure hypercholesterolemia, unspecified: Secondary | ICD-10-CM | POA: Diagnosis not present

## 2017-11-15 MED ORDER — ESCITALOPRAM OXALATE 20 MG PO TABS: 20.0000 mg | ORAL_TABLET | Freq: Every day | ORAL | 1 refills | Status: DC

## 2017-11-15 NOTE — Progress Notes (Signed)
Subjective:    Patient ID: Carolyn Hendrix, female    DOB: 06/10/1941, 77 y.o.   MRN: [id number]  HPI  6 Month follow-up for depression.  Has not been doing well lately overall.  Her ex-husband moved back in with her to her condo.  She is regretting that decision.  And then she decided she wanted to taper off her Lexapro.  She says that was a mistake because now that she is been off of it for several days she has been having mood swings and tearfulness.  She says she like to restart the medication.  IBS -she says her IBS has been flaring so she is been taking the Bentyl more frequently.  She wants to know if she can take it daily for period of time. She has been feeling more bloated nad getting more stomach cramps.     Hyperlipidemia -  On pravastatin and doing well.  Due for labs.    Review of Systems  BP 130/63   Pulse 88   Ht 5\' 6"  (1.676 m)   Wt 145 lb (65.8 kg)   SpO2 95%   BMI 23.40 kg/m     Allergies  Allergen Reactions  . Buspar [Buspirone] Other (See Comments)    Dizziness   . Cymbalta [Duloxetine Hcl] Diarrhea  . Morphine     Past Medical History:  Diagnosis Date  . Chronic LBP    Lumbar DDD  . Colitis, ischemic (HCC)   . Depression   . Endometriosis   . GERD (gastroesophageal reflux disease)   . Hematuria   . Hyperlipidemia   . IBS (irritable bowel syndrome)    Fr Hurrelbrink- on Miralax and Citrucel daily  . Migraines   . MVP (mitral valve prolapse)     Past Surgical History:  Procedure Laterality Date  . ABDOMINAL HYSTERECTOMY  1991   w/ bilat oophorectomy for endometriosis  . APPENDECTOMY    . ESOPHAGOGASTRODUODENOSCOPY  06  . HEMILAMINOTOMY LUMBAR SPINE  11/18/1991   Dr. Lorain ChildesWilliam Bostic  . lapartomy  1995  . OTHER SURGICAL HISTORY  1992, 1994  . right breast biopsy- benign    . SPINAL FUSION  08/18/97   [facility], L4-5 post fusion with iliac crest bone graft  . TONSILLECTOMY AND ADENOIDECTOMY      Social History   Socioeconomic  History  . Marital status: Widowed    Spouse name: Not on file  . Number of children: Not on file  . Years of education: Not on file  . Highest education level: Not on file  Social Needs  . Financial resource strain: Not on file  . Food insecurity - worry: Not on file  . Food insecurity - inability: Not on file  . Transportation needs - medical: Not on file  . Transportation needs - non-medical: Not on file  Occupational History  . Not on file  Tobacco Use  . Smoking status: Former Smoker    Types: Cigarettes    Last attempt to quit: 10/23/1980    Years since quitting: 37.0  . Smokeless tobacco: Never Used  Substance and Sexual Activity  . Alcohol use: No  . Drug use: No  . Sexual activity: Not on file  Other Topics Concern  . Not on file  Social History Narrative  . Not on file    Family History  Problem Relation Age of Onset  . Alzheimer's disease Mother   . Other Father  ACS? - 80's  . Ataxia Sister     Outpatient Encounter Medications as of 11/15/2017  Medication Sig  . diclofenac sodium (VOLTAREN) 1 % GEL Apply 4 g topically 4 (four) times daily. To affected joint.  Marland Kitchen dicyclomine (BENTYL) 20 MG tablet TAKE 1/2 TABLET BEFORE MEALS AND AT BEDTIME  . omeprazole (PRILOSEC) 20 MG capsule TAKE ONE CAPSULE EVERY DAY  . pravastatin (PRAVACHOL) 40 MG tablet TAKE 1 TABLET (40 MG TOTAL) BY MOUTH AT BEDTIME.  . traMADol (ULTRAM) 50 MG tablet TAKE 1 TO 2 TABLETS BY MOUTH 3 TIMES A DAY AS NEEDED FOR PAIN  . traZODone (DESYREL) 100 MG tablet TAKE 1 TABLET (100 MG TOTAL) BY MOUTH AT BEDTIME.  Marland Kitchen TURMERIC PO Take by mouth daily.  . [DISCONTINUED] escitalopram (LEXAPRO) 10 MG tablet Take 10 mg by mouth.  . escitalopram (LEXAPRO) 20 MG tablet Take 1 tablet (20 mg total) by mouth daily.  . [DISCONTINUED] escitalopram (LEXAPRO) 20 MG tablet Take 20 mg by mouth daily.   No facility-administered encounter medications on file as of 11/15/2017.           Objective:   Physical  Exam  Constitutional: She is oriented to person, place, and time. She appears well-developed and well-nourished.  HENT:  Head: Normocephalic and atraumatic.  Cardiovascular: Normal rate, regular rhythm and normal heart sounds.  Pulmonary/Chest: Effort normal and breath sounds normal.  Neurological: She is alert and oriented to person, place, and time.  Skin: Skin is warm and dry.  Psychiatric: She has a normal mood and affect. Her behavior is normal.        Assessment & Plan:  Depression -and restart Lexapro.  Did encourage her to split the tab in half and take 10 mg for couple of days before she goes back up to the 20 mg.  She is really been off of it for about 5 days at this point. We discussed making decision about wether or not she was going to stay with her husband or not.   IBS -did encourage her to take the Bentyl regularly for the next month or 2 just to get everything under control.  I agree I think it stress that is increasing her symptoms.  In regards to her bleeding she can try a probiotic.  She says she has some at home.  Hyperlipidemia-due to recheck lipid panel.

## 2017-11-16 LAB — COMPLETE METABOLIC PANEL WITH GFR
AG Ratio: 1.4 (calc) (ref 1.0–2.5)
ALBUMIN MSPROF: 3.9 g/dL (ref 3.6–5.1)
ALKALINE PHOSPHATASE (APISO): 85 U/L (ref 33–130)
ALT: 16 U/L (ref 6–29)
AST: 20 U/L (ref 10–35)
BUN: 15 mg/dL (ref 7–25)
CO2: 31 mmol/L (ref 20–32)
CREATININE: 0.89 mg/dL (ref 0.60–0.93)
Calcium: 9 mg/dL (ref 8.6–10.4)
Chloride: 100 mmol/L (ref 98–110)
GFR, EST NON AFRICAN AMERICAN: 64 mL/min/{1.73_m2} (ref >=60)
GFR, Est African American: 74 mL/min/{1.73_m2} (ref >=60)
GLOBULIN: 2.8 g/dL (ref 1.9–3.7)
Glucose, Bld: 80 mg/dL (ref 65–99)
Potassium: 4.3 mmol/L (ref 3.5–5.3)
SODIUM: 140 mmol/L (ref 135–146)
Total Bilirubin: 0.3 mg/dL (ref 0.2–1.2)
Total Protein: 6.7 g/dL (ref 6.1–8.1)

## 2017-11-16 LAB — LIPID PANEL W/REFLEX DIRECT LDL
CHOLESTEROL: 198 mg/dL (ref <200)
HDL: 59 mg/dL (ref >50)
LDL Cholesterol (Calc): 123 mg/dL (calc) — ABNORMAL HIGH
Non-HDL Cholesterol (Calc): 139 mg/dL (calc) — ABNORMAL HIGH (ref <130)
Total CHOL/HDL Ratio: 3.4 (calc) (ref <5.0)
Triglycerides: 65 mg/dL (ref <150)

## 2017-11-16 LAB — TSH: TSH: 1.81 mIU/L (ref 0.40–4.50)

## 2017-11-21 ENCOUNTER — Other Ambulatory Visit: Payer: Self-pay | Admitting: Family Medicine

## 2017-12-03 ENCOUNTER — Other Ambulatory Visit: Payer: Self-pay | Admitting: Family Medicine

## 2018-01-17 ENCOUNTER — Other Ambulatory Visit: Payer: Self-pay | Admitting: Family Medicine

## 2018-01-17 NOTE — Telephone Encounter (Signed)
[facility] pharmacy requested a RF on pt's Dicyclomine. RX last refilled for #180, 1/2 tab BID, at OV 11-15-17 with notation:  "IBS -did encourage her to take the Bentyl regularly for the next month or 2 just to get everything under control."  SIG does not match request/provider's note.   Please advise on RF. Thanks!

## 2018-01-17 NOTE — Telephone Encounter (Signed)
New rx sent

## 2018-01-22 ENCOUNTER — Other Ambulatory Visit: Payer: Self-pay | Admitting: Family Medicine

## 2018-02-21 ENCOUNTER — Other Ambulatory Visit: Payer: Self-pay | Admitting: Family Medicine

## 2018-03-13 ENCOUNTER — Ambulatory Visit: Payer: Medicare Other | Admitting: Family Medicine

## 2018-03-13 ENCOUNTER — Encounter: Payer: Self-pay | Admitting: Family Medicine

## 2018-03-13 VITALS — BP 121/58 | HR 91 | Wt 148.0 lb

## 2018-03-13 DIAGNOSIS — F339 Major depressive disorder, recurrent, unspecified: Secondary | ICD-10-CM

## 2018-03-13 DIAGNOSIS — M545 Low back pain, unspecified: Secondary | ICD-10-CM

## 2018-03-13 DIAGNOSIS — F430 Acute stress reaction: Secondary | ICD-10-CM | POA: Diagnosis not present

## 2018-03-13 DIAGNOSIS — G8929 Other chronic pain: Secondary | ICD-10-CM | POA: Diagnosis not present

## 2018-03-13 DIAGNOSIS — M961 Postlaminectomy syndrome, not elsewhere classified: Secondary | ICD-10-CM

## 2018-03-13 DIAGNOSIS — Z9889 Other specified postprocedural states: Secondary | ICD-10-CM

## 2018-03-13 DIAGNOSIS — Z9689 Presence of other specified functional implants: Secondary | ICD-10-CM

## 2018-03-13 NOTE — Progress Notes (Signed)
Subjective:    Patient ID: Carolyn Hendrix, female    DOB: 1941/04/26, 77 y.o.   MRN: [id number]  HPI 77 year old female comes in today complaining of back pain and hip pain.  She just recently followed up with no [facility] for pain management [facility] on May 15 for her chronic back pain.  More recently she is been experiencing some bilateral hip pain.  She does provide prior history of lumbar surgery and has hardware in place.  She has undergone joint injection as well as RF ablation on January 14, 2018.  They prescribed her tramadol which she uses for her pain.  Previously tried gabapentin, Lyrica, Topamax and hydrocodone.  She also has a spinal stimulator in place.  She has been under a lot of stress because her husband actually had a leakage of an old aortic aneurysm repair and then consequently had a stroke.  He is currently in rehab and they are wanting to discharge him home in another week.  She says she really cannot physically take care of him she cannot lift him and pull him.  He is having significant gait instability and right-sided weakness after his stroke.  He is mostly in a wheelchair but will need assistance with transitions and she is not going to be able to do that for him.  He has multiple family members in the area who are his biological children who she is worried do not seem to really understand the amount of care that it is can require for him.  She really feels like he should probably go to a nursing home or either needs an aide in the home 24/7 for care.  He did ask me to complete a form for handicap sticker.  He says she is actually been thinking about getting a rolling walker that has a seat in it so that she can sit down and rest if she has to walk long distances.   Review of Systems  BP (!) 121/58   Pulse 91   Wt 148 lb (67.1 kg)   SpO2 97%   BMI 23.89 kg/m     Allergies  Allergen Reactions  . Buspar [Buspirone] Other (See Comments)    Dizziness   .  Cymbalta [Duloxetine Hcl] Diarrhea  . Morphine     Past Medical History:  Diagnosis Date  . Chronic LBP    Lumbar DDD  . Colitis, ischemic (HCC)   . Depression   . Endometriosis   . GERD (gastroesophageal reflux disease)   . Hematuria   . Hyperlipidemia   . IBS (irritable bowel syndrome)    Fr Hurrelbrink- on Miralax and Citrucel daily  . Migraines   . MVP (mitral valve prolapse)     Past Surgical History:  Procedure Laterality Date  . ABDOMINAL HYSTERECTOMY  1991   w/ bilat oophorectomy for endometriosis  . APPENDECTOMY    . ESOPHAGOGASTRODUODENOSCOPY  06  . HEMILAMINOTOMY LUMBAR SPINE  11/18/1991   Dr. Lorain Childes  . lapartomy  1995  . OTHER SURGICAL HISTORY  1992, 1994  . right breast biopsy- benign    . SPINAL FUSION  08/18/97   Dr. Dorita Fray at [facility], L4-5 post fusion with iliac crest bone graft  . TONSILLECTOMY AND ADENOIDECTOMY      Social History   Socioeconomic History  . Marital status: Married    Spouse name: Not on file  . Number of children: Not on file  . Years of education: Not on file  .  Highest education level: Not on file  Occupational History  . Not on file  Social Needs  . Financial resource strain: Not on file  . Food insecurity:    Worry: Not on file    Inability: Not on file  . Transportation needs:    Medical: Not on file    Non-medical: Not on file  Tobacco Use  . Smoking status: Former Smoker    Types: Cigarettes    Last attempt to quit: 10/23/1980    Years since quitting: 37.4  . Smokeless tobacco: Never Used  Substance and Sexual Activity  . Alcohol use: No  . Drug use: No  . Sexual activity: Not on file  Lifestyle  . Physical activity:    Days per week: Not on file    Minutes per session: Not on file  . Stress: Not on file  Relationships  . Social connections:    Talks on phone: Not on file    Gets together: Not on file    Attends religious service: Not on file    Active member of club or organization: Not on file     Attends meetings of clubs or organizations: Not on file    Relationship status: Not on file  . Intimate partner violence:    Fear of current or ex partner: Not on file    Emotionally abused: Not on file    Physically abused: Not on file    Forced sexual activity: Not on file  Other Topics Concern  . Not on file  Social History Narrative  . Not on file    Family History  Problem Relation Age of Onset  . Alzheimer's disease Mother   . Other Father        ACS? - 28's  . Ataxia Sister     Outpatient Encounter Medications as of 03/13/2018  Medication Sig  . dicyclomine (BENTYL) 10 MG capsule TAKE ONE CAPSULE 3 TIMES A DAY AS NEEDED SPASMS  . escitalopram (LEXAPRO) 20 MG tablet Take 1 tablet (20 mg total) by mouth daily.  Marland Kitchen omeprazole (PRILOSEC) 20 MG capsule TAKE ONE CAPSULE EVERY DAY  . pravastatin (PRAVACHOL) 40 MG tablet TAKE 1 TABLET (40 MG TOTAL) BY MOUTH AT BEDTIME.  . traMADol (ULTRAM) 50 MG tablet TAKE 1 TO 2 TABLETS BY MOUTH 3 TIMES A DAY AS NEEDED FOR PAIN  . traZODone (DESYREL) 100 MG tablet TAKE 1 TABLET (100 MG TOTAL) BY MOUTH AT BEDTIME.  Marland Kitchen TURMERIC PO Take by mouth daily.  . [DISCONTINUED] diclofenac sodium (VOLTAREN) 1 % GEL Apply 4 g topically 4 (four) times daily. To affected joint.   No facility-administered encounter medications on file as of 03/13/2018.          Objective:   Physical Exam  Constitutional: She is oriented to person, place, and time. She appears well-developed and well-nourished.  HENT:  Head: Normocephalic and atraumatic.  Cardiovascular: Normal rate, regular rhythm and normal heart sounds.  Pulmonary/Chest: Effort normal and breath sounds normal.  Neurological: She is alert and oriented to person, place, and time.  Skin: Skin is warm and dry.  Psychiatric: She has a normal mood and affect. Her behavior is normal.          Assessment & Plan:  Acute distress/MDD-she is currently on Lexapro 20 mg.  We will keep at current dose as  she is already at the maximum on that.  We discussed that since he does have some family support that she just needs  to be firm that he cannot come home unless there is an aide to help.  I do feel like it is completely unsafe for her to help him with transitions etc.  I am worried that she would injure herself as well as him  Chronic back and hip pain-.  Active pain management by [facility].  Though I did complete her handicap sticker today.  Time spent 25 minutes, grade 50% time spent counseling about stress and anxiety as well as her chronic back and hip pain.

## 2018-03-14 ENCOUNTER — Encounter: Payer: Self-pay | Admitting: Family Medicine

## 2018-04-20 ENCOUNTER — Other Ambulatory Visit: Payer: Self-pay | Admitting: Family Medicine

## 2018-04-25 ENCOUNTER — Other Ambulatory Visit: Payer: Self-pay | Admitting: Family Medicine

## 2018-05-15 ENCOUNTER — Ambulatory Visit: Payer: Medicare Other | Admitting: Family Medicine

## 2018-05-15 ENCOUNTER — Encounter: Payer: Self-pay | Admitting: Family Medicine

## 2018-05-15 DIAGNOSIS — F430 Acute stress reaction: Secondary | ICD-10-CM | POA: Diagnosis not present

## 2018-05-15 DIAGNOSIS — F339 Major depressive disorder, recurrent, unspecified: Secondary | ICD-10-CM

## 2018-05-15 DIAGNOSIS — K2100 Gastro-esophageal reflux disease with esophagitis, without bleeding: Secondary | ICD-10-CM

## 2018-05-15 DIAGNOSIS — M545 Low back pain, unspecified: Secondary | ICD-10-CM

## 2018-05-15 DIAGNOSIS — M4727 Other spondylosis with radiculopathy, lumbosacral region: Secondary | ICD-10-CM

## 2018-05-15 DIAGNOSIS — K582 Mixed irritable bowel syndrome: Secondary | ICD-10-CM | POA: Diagnosis not present

## 2018-05-15 DIAGNOSIS — G8929 Other chronic pain: Secondary | ICD-10-CM

## 2018-05-15 NOTE — Progress Notes (Signed)
Subjective:    CC: IBS, and stress.   HPI:  F/U IBS -7364-month follow-up for IBS.  She uses Bentyl as needed.  She still has occasional flares but overall is doing okay.  Acute stress/major depressive disorder-currently on Lexapro 20 mg daily.  Is been under a lot of stress over the last 2 months.  Her husband unfortunately had a stroke and since coming home.  He is getting some home health therapy which is helpful she said that she just feels completely exhausted and knows that she has to work on taking care of herself as well.  He is getting a little stronger and is now using a cane.  She also continues to describe struggle with her chronic bilateral low back pain.  And hard taking care of her husband and also dealing with her back pain.  He has been going to get a massage every 2 weeks.  She says is quite expensive but seems to be worth it.  She also wanted to let me know that she is been experiencing a little bit more reflux lately.  She has had some recent changes in her diet.  She does take omeprazole once daily.  She is been taking an antacid in between.  Past medical history, Surgical history, Family history not pertinant except as noted below, Social history, Allergies, and medications have been entered into the medical record, reviewed, and corrections made.   Review of Systems: No fevers, chills, night sweats, weight loss, chest pain, or shortness of breath.   Objective:    General: Well Developed, well nourished, and in no acute distress.  Neuro: Alert and oriented x3, extra-ocular muscles intact, sensation grossly intact.  HEENT: Normocephalic, atraumatic  Skin: Warm and dry, no rashes. Cardiac: Regular rate and rhythm, no murmurs rubs or gallops, no lower extremity edema.  Respiratory: Clear to auscultation bilaterally. Not using accessory muscles, speaking in full sentences.   Impression and Recommendations:    IBS  -able.  Continue current regimen.  Acute stress/major  depressive disorder-PHQ 9 score of 8 we discussed some strategies around taking care of herself and making sure she is getting adequate rest.  Also asking the family for help when needed.  Follow-up in 4 to 6 months.  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  I think it is great that she is going to get a massage every 2 weeks to help with her back.  GERD-recent increase in symptoms.  Okay to increase omeprazole to twice a day for about 2 weeks to get better control of her symptoms.  She can still use an antacid if needed.  If not improving then please let me know and will refer to GI for further work-up.

## 2018-05-25 ENCOUNTER — Other Ambulatory Visit: Payer: Self-pay | Admitting: Family Medicine

## 2018-05-29 ENCOUNTER — Other Ambulatory Visit: Payer: Self-pay | Admitting: Family Medicine

## 2018-08-06 ENCOUNTER — Ambulatory Visit (INDEPENDENT_AMBULATORY_CARE_PROVIDER_SITE_OTHER): Payer: Medicare Other | Admitting: Sports Medicine

## 2018-08-06 ENCOUNTER — Ambulatory Visit (INDEPENDENT_AMBULATORY_CARE_PROVIDER_SITE_OTHER): Payer: Medicare Other

## 2018-08-06 ENCOUNTER — Encounter: Payer: Self-pay | Admitting: Sports Medicine

## 2018-08-06 DIAGNOSIS — M20011 Mallet finger of right finger(s): Secondary | ICD-10-CM

## 2018-08-06 MED ORDER — DOXYCYCLINE HYCLATE 100 MG PO TABS: 100.0000 mg | ORAL_TABLET | Freq: Two times a day (BID) | ORAL | 0 refills | Status: AC

## 2018-08-06 NOTE — Assessment & Plan Note (Addendum)
Approximately 4 years postop with persistent mallet deformity. She and her daughter have been reaching into try and pull things out with tweezers. Likely skin infection. Incision with culture, doxycycline, x-rays to ensure no bony destruction or signs of osteomyelitis. Return in 1 week, if persistent symptoms we will get an MRI looking for an osteomyelitis.  Unfortunately it does look like there is infection all the way down to the bone, because of this we are going to need consultations with infectious disease and she needs to go back to her hand surgeon as this may need amputation considering degree of joint destruction, although it may be appropriate to try a course of 6 weeks of IV antibiotics. Awaiting cultures.  Referral was placed to ID and Hand Surgery.

## 2018-08-06 NOTE — Progress Notes (Addendum)
Subjective:    CC: Finger swelling  HPI: For the past several months this pleasant 77 year old female who was about for 5 years post right fifth mallet finger surgical repair with Dr. Janee Morn at [facility] has had swelling, drainage.  She had been picking at the finger, and felt something black deep inside the wound, continue to pick at it and in fact tried to pull it with tweezers.  Since then its been red, swollen, draining.  Symptoms are moderate, persistent.  No fevers, chills, night sweats, weight loss.  I reviewed the past medical history, family history, social history, surgical history, and allergies today and no changes were needed.  Please see the problem list section below in epic for further details.  Past Medical History: Past Medical History:  Diagnosis Date  . Chronic LBP    Lumbar DDD  . Colitis, ischemic (HCC)   . Depression   . Endometriosis   . GERD (gastroesophageal reflux disease)   . Hematuria   . Hyperlipidemia   . IBS (irritable bowel syndrome)    Fr Hurrelbrink- on Miralax and Citrucel daily  . Migraines   . MVP (mitral valve prolapse)    Past Surgical History: Past Surgical History:  Procedure Laterality Date  . ABDOMINAL HYSTERECTOMY  1991   w/ bilat oophorectomy for endometriosis  . APPENDECTOMY    . ESOPHAGOGASTRODUODENOSCOPY  06  . HEMILAMINOTOMY LUMBAR SPINE  11/18/1991   Dr. Lorain Childes  . lapartomy  1995  . OTHER SURGICAL HISTORY  1992, 1994  . right breast biopsy- benign    . SPINAL FUSION  08/18/97   Dr. Dorita Fray at [facility], L4-5 post fusion with iliac crest bone graft  . TONSILLECTOMY AND ADENOIDECTOMY     Social History: Social History   Socioeconomic History  . Marital status: Married    Spouse name: Not on file  . Number of children: Not on file  . Years of education: Not on file  . Highest education level: Not on file  Occupational History  . Not on file  Social Needs  . Financial resource strain: Not on file    . Food insecurity:    Worry: Not on file    Inability: Not on file  . Transportation needs:    Medical: Not on file    Non-medical: Not on file  Tobacco Use  . Smoking status: Former Smoker    Types: Cigarettes    Last attempt to quit: 10/23/1980    Years since quitting: 37.8  . Smokeless tobacco: Never Used  Substance and Sexual Activity  . Alcohol use: No  . Drug use: No  . Sexual activity: Not on file  Lifestyle  . Physical activity:    Days per week: Not on file    Minutes per session: Not on file  . Stress: Not on file  Relationships  . Social connections:    Talks on phone: Not on file    Gets together: Not on file    Attends religious service: Not on file    Active member of club or organization: Not on file    Attends meetings of clubs or organizations: Not on file    Relationship status: Not on file  Other Topics Concern  . Not on file  Social History Narrative  . Not on file   Family History: Family History  Problem Relation Age of Onset  . Alzheimer's disease Mother   . Other Father        ACS? -  80's  . Ataxia Sister    Allergies: Allergies  Allergen Reactions  . Buspar [Buspirone] Other (See Comments)    Dizziness   . Cymbalta [Duloxetine Hcl] Diarrhea  . Morphine    Medications: See med rec.  Review of Systems: No fevers, chills, night sweats, weight loss, chest pain, or shortness of breath.   Objective:    General: Well Developed, well nourished, and in no acute distress.  Neuro: Alert and oriented x3, extra-ocular muscles intact, sensation grossly intact.  HEENT: Normocephalic, atraumatic, pupils equal round reactive to light, neck supple, no masses, no lymphadenopathy, thyroid nonpalpable.  Skin: Warm and dry, no rashes. Cardiac: Regular rate and rhythm, no murmurs rubs or gallops, no lower extremity edema.  Respiratory: Clear to auscultation bilaterally. Not using accessory muscles, speaking in full sentences. Right hand: Fifth PIP is  held in flexion with persistent mallet deformity, significant overlying erythema, visible purulence under the skin.  I cleaned it with chlorhexidine, and dried it off and used an 18-gauge needle to make a puncture, I did expressed purulence which was sent for culture.  X-rays personally reviewed and show destruction of the PIP consistent with osteomyelitis.  Impression and Recommendations:    History of mallet deformity correction, possible osteomyelitis Approximately 4 years postop with persistent mallet deformity. She and her daughter have been reaching into try and pull things out with tweezers. Likely skin infection. Incision with culture, doxycycline, x-rays to ensure no bony destruction or signs of osteomyelitis. Return in 1 week, if persistent symptoms we will get an MRI looking for an osteomyelitis.  Unfortunately it does look like there is infection all the way down to the bone, because of this we are going to need consultations with infectious disease and she needs to go back to her hand surgeon as this may need amputation considering degree of joint destruction, although it may be appropriate to try a course of 6 weeks of IV antibiotics. Awaiting cultures.  Referral was placed to ID and Hand Surgery. ___________________________________________ Ihor Austin. Benjamin Stain, M.D., ABFM., CAQSM. Primary Care and Sports Medicine [facility]  Adjunct Professor of Family Medicine  [employer]

## 2018-08-07 ENCOUNTER — Telehealth: Payer: Self-pay | Admitting: Family Medicine

## 2018-08-07 NOTE — Addendum Note (Signed)
Addended by: Monica Becton on: 08/07/2018 09:05 AM   Modules accepted: Orders

## 2018-08-07 NOTE — Telephone Encounter (Signed)
FYI:  Pt called. She stated that she has stomach pain more than her usual pain with IBS.  I spoke to Marylene Land and she advised that patient go to Urgent Care....pt advised.  Thanks!

## 2018-08-09 ENCOUNTER — Ambulatory Visit: Payer: Medicare Other | Admitting: Family Medicine

## 2018-08-09 LAB — WOUND CULTURE
MICRO NUMBER:: 91238859
SPECIMEN QUALITY:: ADEQUATE

## 2018-08-13 ENCOUNTER — Ambulatory Visit
Admission: RE | Admit: 2018-08-13 | Discharge: 2018-08-13 | Disposition: A | Discharge status: Home / Self Care | Payer: Medicare Other | Source: Ambulatory Visit | Attending: Internal Medicine | Admitting: Internal Medicine

## 2018-08-13 ENCOUNTER — Encounter: Payer: Self-pay | Admitting: Internal Medicine

## 2018-08-13 ENCOUNTER — Ambulatory Visit: Payer: Medicare Other | Admitting: Internal Medicine

## 2018-08-13 VITALS — BP 125/72 | HR 93 | Temp 98.0°F | Ht 66.0 in | Wt 148.0 lb

## 2018-08-13 DIAGNOSIS — M20011 Mallet finger of right finger(s): Secondary | ICD-10-CM

## 2018-08-13 LAB — BASIC METABOLIC PANEL
BUN/Creatinine Ratio: 24 (calc) — ABNORMAL HIGH (ref 6–22)
BUN: 25 mg/dL (ref 7–25)
CALCIUM: 8.9 mg/dL (ref 8.6–10.4)
CHLORIDE: 100 mmol/L (ref 98–110)
CO2: 30 mmol/L (ref 20–32)
CREATININE: 1.04 mg/dL — AB (ref 0.60–0.93)
Glucose, Bld: 101 mg/dL — ABNORMAL HIGH (ref 65–99)
Potassium: 4.8 mmol/L (ref 3.5–5.3)
Sodium: 140 mmol/L (ref 135–146)

## 2018-08-13 LAB — SEDIMENTATION RATE: Sed Rate: 22 mm/h (ref 0–30)

## 2018-08-13 MED ORDER — RIFAMPIN 300 MG PO CAPS: 300.0000 mg | ORAL_CAPSULE | Freq: Two times a day (BID) | ORAL | 0 refills | Status: DC

## 2018-08-13 MED ORDER — SULFAMETHOXAZOLE-TRIMETHOPRIM 800-160 MG PO TABS: 1.0000 | ORAL_TABLET | Freq: Two times a day (BID) | ORAL | 0 refills | Status: DC

## 2018-08-13 NOTE — Progress Notes (Signed)
Finished last dose of Doxy today

## 2018-08-13 NOTE — Progress Notes (Addendum)
Patient ID: MAUDENE STOTLER, female   DOB: 1940-12-12, 77 y.o.   MRN: [id number]     [facility] for Infectious Disease      Reason for Consult: Finger infection    Referring Physician: Dr. Dianah Field    Patient ID: [name], female    DOB: May 11, 1941, 77 y.o.   MRN: [id number]  HPI:   She comes in for evaluation of above.  About 5 years ago she underwent surgical repair of her right fifth mallet finger by Dr. Milly Jakob and had done well until recently she noted something sticking out of the finger and started to pick at it.  It was found to be a suture that was stuck in her finger that was growing out but because she has picked at it she developed an opening and some drainage.  The drainage did grow MSSA in the swab culture of pus that was coming out by Dr.Thekkekandam.  She was started by him on doxycycline and referred him here and back to Dr. Grandville Silos.  She did see Dr. Grandville Silos last week who feels it is a result of the suture that she was picking at and does have plans according the patient to remove the stitch.  An x-ray done initially by her family medicine provider did note some concern for acute osteomyelitis.  She sees me today and she does note that her finger has improved since last week when she was initially seen and started on the antibiotic.  There is still some swelling and deformity but it is much less painful, red and less warmth noted. Finger x-ray noted and independently reviewed some joint destruction noted.   Past Medical History:  Diagnosis Date  . Chronic LBP    Lumbar DDD  . Colitis, ischemic ([place])   . Depression   . Endometriosis   . GERD (gastroesophageal reflux disease)   . Hematuria   . Hyperlipidemia   . IBS (irritable bowel syndrome)    Fr Hurrelbrink- on Miralax and Citrucel daily  . Migraines   . MVP (mitral valve prolapse)     Prior to Admission medications   Medication Sig Start Date End Date Taking? Authorizing Provider    dicyclomine (BENTYL) 10 MG capsule TAKE ONE CAPSULE 3 TIMES A DAY AS NEEDED SPASMS 01/17/18  Yes Hali Marry, MD  doxycycline (VIBRA-TABS) 100 MG tablet Take 1 tablet (100 mg total) by mouth 2 (two) times daily for 7 days. 08/06/18 08/13/18 Yes Silverio Decamp, MD  escitalopram (LEXAPRO) 20 MG tablet TAKE 1 TABLET BY MOUTH EVERY DAY 04/26/18  Yes Hali Marry, MD  omeprazole (PRILOSEC) 20 MG capsule TAKE ONE CAPSULE EVERY DAY 04/22/18  Yes Hali Marry, MD  pravastatin (PRAVACHOL) 40 MG tablet TAKE 1 TABLET (40 MG TOTAL) BY MOUTH AT BEDTIME. 11/21/17  Yes Hali Marry, MD  traMADol (ULTRAM) 50 MG tablet TAKE 1 TO 2 TABLETS BY MOUTH 3 TIMES A DAY AS NEEDED FOR PAIN 05/01/17  Yes [provider]  traZODone (DESYREL) 100 MG tablet TAKE 1 TABLET (100 MG TOTAL) BY MOUTH AT BEDTIME. 05/27/18  Yes Hali Marry, MD  TURMERIC PO Take by mouth daily.   Yes [provider]  rifampin (RIFADIN) 300 MG capsule Take 1 capsule (300 mg total) by mouth 2 (two) times daily. 08/13/18   Laycee Fitzsimmons, Okey Regal, MD  sulfamethoxazole-trimethoprim (BACTRIM DS,SEPTRA DS) 800-160 MG tablet Take 1 tablet by mouth 2 (two) times daily. 08/13/18   Scharlene Gloss  W, MD    Allergies  Allergen Reactions  . Buspar [Buspirone] Other (See Comments)    Dizziness   . Cymbalta [Duloxetine Hcl] Diarrhea  . Morphine     Social History   Tobacco Use  . Smoking status: Former Smoker    Types: Cigarettes    Last attempt to quit: 10/23/1980    Years since quitting: 37.8  . Smokeless tobacco: Never Used  Substance Use Topics  . Alcohol use: No  . Drug use: No    Family History  Problem Relation Age of Onset  . Alzheimer's disease Mother   . Other Father        ACS? - 83's  . Ataxia Sister     Review of Systems  Constitutional: negative for fevers and chills Gastrointestinal: negative for diarrhea Integument/breast: negative for rash All other systems reviewed and  are negative    Constitutional: in no apparent distress and alert  Vitals:   08/13/18 1135  BP: 125/72  Pulse: 93  Temp: 98 F (36.7 C)   EYES: anicteric ENMT: no thrush Cardiovascular: Cor RRR Respiratory: CTA B; normal respiratory effort GI: soft, nt Musculoskeletal: Fifth right finger with some swelling and a little erythema but no significant warmth or tenderness. Skin: no rash Hematologic: no cervical lad  Labs: Lab Results  Component Value Date   WBC 5.7 11/10/2016   HGB 12.4 11/10/2016   HCT 38.6 11/10/2016   MCV 94.1 11/10/2016   PLT 339 11/10/2016    Lab Results  Component Value Date   CREATININE 0.89 11/16/2017   BUN 15 11/16/2017   NA 140 11/16/2017   K 4.3 11/16/2017   CL 100 11/16/2017   CO2 31 11/16/2017    Lab Results  Component Value Date   ALT 16 11/16/2017   AST 20 11/16/2017   ALKPHOS 80 11/10/2016   BILITOT 0.3 11/16/2017     Assessment: possible osteomyelitis.  X-ray findings concerning for osteomyelitis though she may have some underlying osteoarthritis and findings may relate more to her previous deformity that was corrected.  Certainly with the drainage that had been noted and swelling it is concerning for deep infection though.  I discussed the options of treatment including IV versus oral therapy.  She does have a positive culture with MSSA, though not definitive culture since it was a wound swab, I would most suspect this is the culprit pathogen.  I think with a sensitive staph and unclear picture of bone infection I will start her on Bactrim with rifampin which has good bone penetration for staph aureus infections.  Additionally I will repeat an x-ray to see if there is any progression of disease and recheck her renal function. She also has follow-up with Dr. Grandville Silos and I feel the sooner she gets the stitch out the better from an infection standpoint.  Plan: 1) xray 2) BMP 3) ESR rtc 1 week  Addendum 08/14/2018: xray reviewed and  really no significant progression.  I feel ostoemyelitis unlikely and more c/w a superficial infection. She will continue with bactrim and rifampin and will see next week but I do not feel IV therapy or prolonged antibiotics indicated.  Additionally her creat is mildly elevated (prior to starting Bactrim) so will monitor closely and can go back to doxycycline if needed.

## 2018-08-14 ENCOUNTER — Ambulatory Visit: Payer: Medicare Other | Admitting: Sports Medicine

## 2018-08-15 ENCOUNTER — Telehealth: Payer: Self-pay

## 2018-08-15 NOTE — Telephone Encounter (Signed)
Patient called office today requesting results from lab work done earlier this week. Patient has an appointment today at 12:15, but states it okay to leave a detailed message with results. Will route message to Dr. Luciana Axe to review labs for patient.  Lorenso Courier, [place]

## 2018-08-15 NOTE — Telephone Encounter (Signed)
The sed rate, inflammatory marker looked great, no signs of inflammation by that test. Her kidney test was very minimally elevated, likely from the medication and should be rechecked by her PCp in the next month or so.  thanks

## 2018-08-15 NOTE — Telephone Encounter (Signed)
Called patient back regarding labs. Patient was able to take my call and go over lab results. Patient does not have any questions at this time. Understands to have her kidney test done again by PCP in a month. Lorenso Courier, [place]

## 2018-08-19 ENCOUNTER — Telehealth: Payer: Self-pay | Admitting: *Deleted

## 2018-08-19 NOTE — Telephone Encounter (Signed)
Have her stop the rifampin, that is typically the issue.   She can stop the antibiotics after her surgery since the infection was not deep.   thanks

## 2018-08-19 NOTE — Telephone Encounter (Signed)
Patient called and advised that since she started the rifampin and Bactrim antibiotics given by [facility] she has been dizzy, nauseous and having a hard time concentrating and she wants to know if there is another medication she can have. Her surgery is set for Friday 08/23/18. Advised will let the provider know and give her a call back.

## 2018-08-19 NOTE — Telephone Encounter (Signed)
Patient informed she can stop the rifampin and discontinue the Bactrim after her surgery.   Laurell Josephs, RN

## 2018-08-21 ENCOUNTER — Other Ambulatory Visit: Payer: Self-pay | Admitting: Family Medicine

## 2018-08-22 ENCOUNTER — Ambulatory Visit: Payer: Medicare Other | Admitting: Internal Medicine

## 2018-09-07 ENCOUNTER — Other Ambulatory Visit: Payer: Self-pay | Admitting: Internal Medicine

## 2018-09-10 ENCOUNTER — Telehealth: Payer: Self-pay | Admitting: Family Medicine

## 2018-09-10 NOTE — Telephone Encounter (Signed)
Could I come in for blood check concerning kidney concern from Dr Luciana Axeomer as referenced in email earlier. If so please let me know when.  question came via  via mychart.. Thanks.

## 2018-09-11 NOTE — Telephone Encounter (Signed)
Appointment made. Thanks

## 2018-09-25 ENCOUNTER — Telehealth: Payer: Self-pay

## 2018-09-25 NOTE — Telephone Encounter (Signed)
Patient advised of recommendations and rescheduled.

## 2018-09-25 NOTE — Telephone Encounter (Signed)
She is good. Dr. Luciana Axeomer did a BMP in October. Have her move her appt to the end of January for a 6 month followup.

## 2018-09-25 NOTE — Telephone Encounter (Signed)
Carolyn DavenportSandra called and left a message stating she would like to cancel her appointment on 10/03/2018. She would like lab work only. Please advise of any labs that may need to be ordered.

## 2018-10-03 ENCOUNTER — Ambulatory Visit: Payer: Medicare Other | Admitting: Family Medicine

## 2018-10-08 ENCOUNTER — Other Ambulatory Visit: Payer: Self-pay | Admitting: Family Medicine

## 2018-10-30 ENCOUNTER — Ambulatory Visit: Payer: Medicare Other | Admitting: Family Medicine

## 2018-11-04 ENCOUNTER — Ambulatory Visit (INDEPENDENT_AMBULATORY_CARE_PROVIDER_SITE_OTHER): Payer: Medicare Other | Admitting: Family Medicine

## 2018-11-04 ENCOUNTER — Encounter: Payer: Self-pay | Admitting: Family Medicine

## 2018-11-04 VITALS — BP 124/59 | HR 90 | Ht 66.0 in | Wt 148.0 lb

## 2018-11-04 DIAGNOSIS — E7800 Pure hypercholesterolemia, unspecified: Secondary | ICD-10-CM | POA: Diagnosis not present

## 2018-11-04 DIAGNOSIS — Z1231 Encounter for screening mammogram for malignant neoplasm of breast: Secondary | ICD-10-CM | POA: Diagnosis not present

## 2018-11-04 DIAGNOSIS — N289 Disorder of kidney and ureter, unspecified: Secondary | ICD-10-CM

## 2018-11-04 DIAGNOSIS — F339 Major depressive disorder, recurrent, unspecified: Secondary | ICD-10-CM

## 2018-11-04 NOTE — Patient Instructions (Signed)
Discuss the shingles vaccine with your pharmacist.

## 2018-11-04 NOTE — Progress Notes (Signed)
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 Her husband had a stroke earlier this year.  Been under a lot of stress taking care of her husband.  Is been getting therapy since the stroke in April.  It is been a lot on her physically and emotionally.  And with her back problems she really cannot do a lot of bending and lifting.  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  She was able to come off of the antibiotics pretty shortly after surgery.  She was actually having significant side effects including weight loss and dizziness.  The infection did clear up and she did well with the surgery they fused the finger.  They did want us to recheck her renal function because she had a slight bump in her creatinine.  Hyperlipidemia-tolerating pravastatin well without any side effects or problems.    Past medical history, Surgical history, Family history not pertinant except as noted below, Social history, Allergies, and medications have been entered into the medical record, reviewed, and corrections made.   Review of Systems: No fevers, chills, night sweats, weight loss, chest pain, or shortness of breath.   Objective:    General: Well Developed, well nourished, and in no acute distress.  Neuro: Alert and oriented x3, extra-ocular muscles intact, sensation grossly intact.  HEENT: Normocephalic, atraumatic  Skin: Warm and dry, no rashes. Cardiac: Regular rate and rhythm, no murmurs rubs or gallops, no lower extremity edema.  Respiratory: Clear to auscultation bilaterally. Not using accessory muscles, speaking in full sentences.   Impression and Recommendations:    Acute stress/major depressive disorder  -doing well on Lexapro.  Continue current regimen.  Follow-up in 6 months.  Renal insufficiency-due to recheck renal function.  Hyperlipidemia-due to recheck lipid panel.  Due  for mammogram.

## 2018-11-06 ENCOUNTER — Other Ambulatory Visit: Payer: Self-pay | Admitting: Family Medicine

## 2018-11-06 NOTE — Progress Notes (Deleted)
Subjective:   Carolyn Hendrix is a 78 y.o. female who presents for Medicare Annual (Subsequent) preventive examination.  Review of Systems:  No ROS.  Medicare Wellness Visit. Additional risk factors are reflected in the social history.    Sleep patterns:   Home Safety/Smoke Alarms: Feels safe in home. Smoke alarms in place.  Living environment; Seat Belt Safety/Bike Helmet: Wears seat belt.   Female:   Pap-  Age out     Mammo-       Dexa scan-        CCS- utd    Objective:     Vitals: There were no vitals taken for this visit.  There is no height or weight on file to calculate BMI.  Advanced Directives 05/23/2017 09/11/2016 09/23/2014  Does Patient Have a Medical Advance Directive? Yes Yes Yes  Type of Estate agent of [place];Living will Healthcare Power of [place];Living will Living will  Does patient want to make changes to medical advance directive? - No - Patient declined -  Copy of Healthcare Power of Attorney in Chart? Yes Yes No - copy requested    Tobacco Social History   Tobacco Use  Smoking Status Former Smoker  . Types: Cigarettes  . Last attempt to quit: 10/23/1980  . Years since quitting: 38.0  Smokeless Tobacco Never Used     Counseling given: Not Answered   Clinical Intake:                       Past Medical History:  Diagnosis Date  . Chronic LBP    Lumbar DDD  . Colitis, ischemic (HCC)   . Depression   . Endometriosis   . GERD (gastroesophageal reflux disease)   . Hematuria   . Hyperlipidemia   . IBS (irritable bowel syndrome)    Fr Hurrelbrink- on Miralax and Citrucel daily  . Migraines   . MVP (mitral valve prolapse)    Past Surgical History:  Procedure Laterality Date  . ABDOMINAL HYSTERECTOMY  1991   w/ bilat oophorectomy for endometriosis  . APPENDECTOMY    . ESOPHAGOGASTRODUODENOSCOPY  06  . HEMILAMINOTOMY LUMBAR SPINE  11/18/1991   Dr. Lorain Childes  . lapartomy  1995  . OTHER SURGICAL  HISTORY  1992, 1994  . right breast biopsy- benign    . SPINAL FUSION  08/18/97   Dr. Dorita Fray at [facility], L4-5 post fusion with iliac crest bone graft  . TONSILLECTOMY AND ADENOIDECTOMY     Family History  Problem Relation Age of Onset  . Alzheimer's disease Mother   . Other Father        ACS? - 73's  . Ataxia Sister    Social History   Socioeconomic History  . Marital status: Married    Spouse name: Not on file  . Number of children: Not on file  . Years of education: Not on file  . Highest education level: Not on file  Occupational History  . Not on file  Social Needs  . Financial resource strain: Not on file  . Food insecurity:    Worry: Not on file    Inability: Not on file  . Transportation needs:    Medical: Not on file    Non-medical: Not on file  Tobacco Use  . Smoking status: Former Smoker    Types: Cigarettes    Last attempt to quit: 10/23/1980    Years since quitting: 38.0  . Smokeless tobacco: Never Used  Substance and Sexual Activity  . Alcohol use: No  . Drug use: No  . Sexual activity: Not on file  Lifestyle  . Physical activity:    Days per week: Not on file    Minutes per session: Not on file  . Stress: Not on file  Relationships  . Social connections:    Talks on phone: Not on file    Gets together: Not on file    Attends religious service: Not on file    Active member of club or organization: Not on file    Attends meetings of clubs or organizations: Not on file    Relationship status: Not on file  Other Topics Concern  . Not on file  Social History Narrative  . Not on file    Outpatient Encounter Medications as of 11/13/2018  Medication Sig  . dicyclomine (BENTYL) 10 MG capsule TAKE ONE CAPSULE 3 TIMES A DAY AS NEEDED SPASMS  . escitalopram (LEXAPRO) 20 MG tablet TAKE 1 TABLET BY MOUTH EVERY DAY  . naloxone (NARCAN) nasal spray 4 mg/0.1 mL Place into the nose.  Marland Kitchen. omeprazole (PRILOSEC) 20 MG capsule TAKE ONE CAPSULE EVERY DAY  .  pravastatin (PRAVACHOL) 40 MG tablet TAKE 1 TABLET (40 MG TOTAL) BY MOUTH AT BEDTIME.  . traMADol (ULTRAM) 50 MG tablet TAKE 1 TO 2 TABLETS BY MOUTH 3 TIMES A DAY AS NEEDED FOR PAIN  . traZODone (DESYREL) 100 MG tablet TAKE 1 TABLET (100 MG TOTAL) BY MOUTH AT BEDTIME.  Marland Kitchen. TURMERIC PO Take by mouth daily.   No facility-administered encounter medications on file as of 11/13/2018.     Activities of Daily Living No flowsheet data found.  Patient Care Team: Agapito GamesMetheney, Catherine D, MD as PCP - General (Family Medicine) Rosario JacksPittaway, Donald, MD as Referring Physician (Obstetrics and Gynecology) Charyl BiggerKapural, Leonardo, MD as Consulting Physician (Anesthesiology) Farris HasMigliardi, Robert, MD as Referring Physician (Dermatology)    Assessment:   This is a routine wellness examination for Dois DavenportSandra.Physical assessment deferred to PCP.   Exercise Activities and Dietary recommendations   Diet  Breakfast: Lunch:  Dinner:       Goals   None     Fall Risk Fall Risk  08/13/2018 05/15/2018 01/11/2017 09/27/2015 09/23/2014  Falls in the past year? No No No No No   Is the patient's home free of loose throw rugs in walkways, pet beds, electrical cords, etc?   {Blank single:19197::"yes","no"}      Grab bars in the bathroom? {Blank single:19197::"yes","no"}      Handrails on the stairs?   {Blank single:19197::"yes","no"}      Adequate lighting?   {Blank single:19197::"yes","no"}  Depression Screen PHQ 2/9 Scores 11/04/2018 08/13/2018 05/15/2018 11/15/2017  PHQ - 2 Score 0 0 3 2  PHQ- 9 Score - - 8 5     Cognitive Function MMSE - Mini Mental State Exam 09/24/2014  Orientation to time 0  Orientation to Place 0  Registration 0  Attention/ Calculation 0  Recall 0  Language- name 2 objects 0  Language- repeat 0  Language- follow 3 step command 0  Language- read & follow direction 0  Write a sentence 0  Copy design 0  Total score 0     6CIT Screen 11/10/2016  What Year? 0 points  What month? 0 points  What  time? 0 points  Count back from 20 0 points  Months in reverse 0 points  Repeat phrase 0 points  Total Score 0    Immunization History  Administered Date(s) Administered  . Influenza Split 08/30/2011, 08/07/2012  . Influenza Whole 08/19/2008, 08/03/2010  . Influenza, Seasonal, Injecte, Preservative Fre 07/22/2013  . Influenza,inj,Quad PF,6+ Mos 09/23/2014, 06/25/2015, 09/08/2016  . Influenza-Unspecified 06/27/2017, 08/11/2018  . Pneumococcal Conjugate-13 09/23/2014  . Pneumococcal Polysaccharide-23 06/02/2009  . Td 05/16/2006  . Tdap 12/18/2016  . Zoster 05/30/2011    Screening Tests Health Maintenance  Topic Date Due  . COLONOSCOPY  11/16/2025  . TETANUS/TDAP  12/18/2026  . INFLUENZA VACCINE  Completed  . DEXA SCAN  Completed  . PNA vac Low Risk Adult  Completed       Plan:   ***   I have personally reviewed and noted the following in the patient's chart:   . Medical and social history . Use of alcohol, tobacco or illicit drugs  . Current medications and supplements . Functional ability and status . Nutritional status . Physical activity . Advanced directives . List of other physicians . Hospitalizations, surgeries, and ER visits in previous 12 months . Vitals . Screenings to include cognitive, depression, and falls . Referrals and appointments  In addition, I have reviewed and discussed with patient certain preventive protocols, quality metrics, and best practice recommendations. A written personalized care plan for preventive services as well as general preventive health recommendations were provided to patient.     Normand SloopKimberly A , LPN  1/19/14781/15/2020

## 2018-11-13 ENCOUNTER — Ambulatory Visit: Payer: Medicare Other

## 2018-11-13 ENCOUNTER — Ambulatory Visit (INDEPENDENT_AMBULATORY_CARE_PROVIDER_SITE_OTHER): Payer: Medicare Other

## 2018-11-13 DIAGNOSIS — Z1231 Encounter for screening mammogram for malignant neoplasm of breast: Secondary | ICD-10-CM | POA: Diagnosis not present

## 2018-11-18 ENCOUNTER — Other Ambulatory Visit: Payer: Self-pay | Admitting: Family Medicine

## 2018-11-18 DIAGNOSIS — R928 Other abnormal and inconclusive findings on diagnostic imaging of breast: Secondary | ICD-10-CM

## 2018-11-19 ENCOUNTER — Ambulatory Visit (INDEPENDENT_AMBULATORY_CARE_PROVIDER_SITE_OTHER): Payer: Medicare Other | Admitting: *Deleted

## 2018-11-19 VITALS — BP 125/61 | HR 61 | Ht 66.0 in | Wt 149.0 lb

## 2018-11-19 DIAGNOSIS — Z0000 Encounter for general adult medical examination without abnormal findings: Secondary | ICD-10-CM | POA: Diagnosis not present

## 2018-11-19 DIAGNOSIS — Z780 Asymptomatic menopausal state: Secondary | ICD-10-CM

## 2018-11-19 NOTE — Progress Notes (Signed)
Subjective:   Carolyn Hendrix is a 78 y.o. female who presents for Medicare Annual (Subsequent) preventive examination.  Review of Systems:  No ROS.  Medicare Wellness Visit. Additional risk factors are reflected in the social history.  Cardiac Risk Factors include: dyslipidemia;advanced age (>75men, >92 women) Sleep patterns: Getting 8 hours of sleep a night. Wakes 1-2 times a night to go to the bathroom. When wakes up feels rested. Home Safety/Smoke Alarms: Feels safe in home. Smoke alarms in place.  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 No stairs in the home. Shower is a walk in shower and grab bars in place aswell as anti slip strips in bottom of tub Seat Belt Safety/Bike Helmet: Wears seat belt.   Female:   Pap- aged out      Mammo- utd      Dexa scan- ordered       CCS-utd     Objective:     Vitals: BP 125/61 (BP Location: Left Arm, Patient Position: Sitting, Cuff Size: Normal)   Pulse 61   Ht 5\' 6"  (1.676 m)   Wt 149 lb (67.6 kg)   SpO2 97%   BMI 24.05 kg/m   Body mass index is 24.05 kg/m.  Advanced Directives 11/19/2018 05/23/2017 09/11/2016 09/23/2014  Does Patient Have a Medical Advance Directive? Yes Yes Yes Yes  Type of Estate agent of [place];Living will Healthcare Power of [place];Living will Healthcare Power of [place];Living will Living will  Does patient want to make changes to medical advance directive? No - Patient declined - No - Patient declined -  Copy of Healthcare Power of Attorney in Chart? No - copy requested Yes Yes No - copy requested    Tobacco Social History   Tobacco Use  Smoking Status Former Smoker  . Types: Cigarettes  . Last attempt to quit: 10/23/1980  . Years since quitting: 38.0  Smokeless Tobacco Never Used     Counseling given: Not Answered   Clinical Intake:  Pre-visit preparation completed: Yes  Pain : 0-10 Pain Score: 5  Pain Type: Chronic  pain Pain Location: Back Pain Orientation: Lower Pain Radiating Towards: radiates into ribs Pain Descriptors / Indicators: Aching Pain Onset: More than a month ago Pain Frequency: Constant Pain Relieving Factors: Tramadol Effect of Pain on Daily Activities: does effect daily activities and is followed by the pain Clinic in [place]  Pain Relieving Factors: Tramadol  Nutritional Risks: None Diabetes: No  How often do you need to have someone help you when you read instructions, pamphlets, or other written materials from your doctor or pharmacy?: 1 - Never What is the last grade level you completed in school?: 12  Interpreter Needed?: No  Information entered by :: Carolin Sicks, LPN  Past Medical History:  Diagnosis Date  . Chronic LBP    Lumbar DDD  . Colitis, ischemic (HCC)   . Depression   . Endometriosis   . GERD (gastroesophageal reflux disease)   . Hematuria   . Hyperlipidemia   . IBS (irritable bowel syndrome)    Fr Hurrelbrink- on Miralax and Citrucel daily  . Migraines   . MVP (mitral valve prolapse)    Past Surgical History:  Procedure Laterality Date  . ABDOMINAL HYSTERECTOMY  1991   w/ bilat oophorectomy for endometriosis  . APPENDECTOMY    . BREAST BIOPSY    . ESOPHAGOGASTRODUODENOSCOPY  06  . fused 5th digit right hand Right   .  HEMILAMINOTOMY LUMBAR SPINE  11/18/1991   Dr. Lorain ChildesWilliam Bostic  . lapartomy  1995  . OTHER SURGICAL HISTORY  1992, 1994  . right breast biopsy- benign    . SPINAL FUSION  08/18/97   [facility], L4-5 post fusion with iliac crest bone graft  . TONSILLECTOMY AND ADENOIDECTOMY     Family History  Problem Relation Age of Onset  . Alzheimer's disease Mother   . Other Father        ACS? - 7680's  . Ataxia Sister    Social History   Socioeconomic History  . Marital status: Married    Spouse name: [name]  . Number of children: 6  . Years of education: 4912  . Highest education level: 12th grade  Occupational History  .  Occupation: retired    Comment: Print production planneroffice manager [place]  Social Needs  . Financial resource strain: Not hard at all  . Food insecurity:    Worry: Never true    Inability: Never true  . Transportation needs:    Medical: No    Non-medical: No  Tobacco Use  . Smoking status: Former Smoker    Types: Cigarettes    Last attempt to quit: 10/23/1980    Years since quitting: 38.0  . Smokeless tobacco: Never Used  Substance and Sexual Activity  . Alcohol use: No  . Drug use: No  . Sexual activity: Not Currently  Lifestyle  . Physical activity:    Days per week: 0 days    Minutes per session: 0 min  . Stress: Very much  Relationships  . Social connections:    Talks on phone: More than three times a week    Gets together: Never    Attends religious service: More than 4 times per year    Active member of club or organization: No    Attends meetings of clubs or organizations: Never    Relationship status: Married  Other Topics Concern  . Not on file  Social History Narrative   Takes care of husband full time after he had a stroke last year. Stressed out due to this. Home health is in for right now.    Outpatient Encounter Medications as of 11/19/2018  Medication Sig  . dicyclomine (BENTYL) 10 MG capsule TAKE ONE CAPSULE 3 TIMES A DAY AS NEEDED SPASMS  . escitalopram (LEXAPRO) 20 MG tablet TAKE 1 TABLET BY MOUTH EVERY DAY  . glucosamine-chondroitin 500-400 MG tablet Take 1 tablet by mouth 2 (two) times daily.  Marland Kitchen. omeprazole (PRILOSEC) 20 MG capsule TAKE ONE CAPSULE EVERY DAY  . pravastatin (PRAVACHOL) 40 MG tablet Take 1 tablet (40 mg total) by mouth daily. Due for labs  . traMADol (ULTRAM) 50 MG tablet TAKE 1 TO 2 TABLETS BY MOUTH 3 TIMES A DAY AS NEEDED FOR PAIN  . traZODone (DESYREL) 100 MG tablet TAKE 1 TABLET (100 MG TOTAL) BY MOUTH AT BEDTIME.  Marland Kitchen. TURMERIC PO Take by mouth daily.  . naloxone (NARCAN) nasal spray 4 mg/0.1 mL Place into the nose.   No  facility-administered encounter medications on file as of 11/19/2018.     Activities of Daily Living In your present state of health, do you have any difficulty performing the following activities: 11/19/2018  Hearing? N  Vision? N  Difficulty concentrating or making decisions? Y  Walking or climbing stairs? N  Dressing or bathing? N  Doing errands, shopping? N  Preparing Food and eating ? N  Using the Toilet? N  In the past six months, have you accidently leaked urine? N  Do you have problems with loss of bowel control? N  Managing your Medications? N  Managing your Finances? N  Housekeeping or managing your Housekeeping? N  Some recent data might be hidden    Patient Care Team: Agapito GamesMetheney, Catherine D, MD as PCP - General (Family Medicine) Rosario JacksPittaway, Donald, MD as Referring Physician (Obstetrics and Gynecology) Charyl BiggerKapural, Leonardo, MD as Consulting Physician (Anesthesiology) Farris HasMigliardi, Robert, MD as Referring Physician (Dermatology)    Assessment:   This is a routine wellness examination for Carolyn DavenportSandra.Physical assessment deferred to PCP. Patients husband had a stroke last year and has been taking care him fulltime in the home [facility]. Has chronic lower back pain that is Managed by the pain Clinic in [place].  Exercise Activities and Dietary recommendations Current Exercise Habits: The patient does not participate in regular exercise at present, Exercise limited by: Other - see comments(taking care of husband full time in the home after stroke) Diet Patient reports somewhat healthy. Does eat fruit and tries to get vegetables in. Doesn't eat out a lot. Breakfast: cheerios with activia yogurt Lunch: string cheese Dinner: soup or grilled cheese sandwich, chicken and a vegetable.     Goals    . Exercise 3x per week (30 min per time)     Try and walk 3 times a week for at least 30 minutes at a time. This will relieve you stress level as well.       Fall Risk Fall Risk   11/19/2018 08/13/2018 05/15/2018 01/11/2017 09/27/2015  Falls in the past year? 0 No No No No  Risk for fall due to : Other (Comment) - - - -  Risk for fall due to: Comment taking care of husband at home who had a stroke - - - -  Follow up Falls prevention discussed - - - -   Is the patient's home free of loose throw rugs in walkways, pet beds, electrical cords, etc?   yes      Grab bars in the bathroom? yes      Handrails on the stairs?   no      Adequate lighting?   yes  Depression Screen PHQ 2/9 Scores 11/19/2018 11/04/2018 08/13/2018 05/15/2018  PHQ - 2 Score 2 0 0 3  PHQ- 9 Score 4 - - 8     Cognitive Function MMSE - Mini Mental State Exam 09/24/2014  Orientation to time 0  Orientation to Place 0  Registration 0  Attention/ Calculation 0  Recall 0  Language- name 2 objects 0  Language- repeat 0  Language- follow 3 step command 0  Language- read & follow direction 0  Write a sentence 0  Copy design 0  Total score 0     6CIT Screen 11/19/2018 11/10/2016  What Year? 0 points 0 points  What month? 0 points 0 points  What time? 0 points 0 points  Count back from 20 0 points 0 points  Months in reverse 0 points 0 points  Repeat phrase 0 points 0 points  Total Score 0 0    Immunization History  Administered Date(s) Administered  . Influenza Split 08/30/2011, 08/07/2012  . Influenza Whole 08/19/2008, 08/03/2010  . Influenza, Seasonal, Injecte, Preservative Fre 07/22/2013  . Influenza,inj,Quad PF,6+ Mos 09/23/2014, 06/25/2015, 09/08/2016  . Influenza-Unspecified 06/27/2017, 08/11/2018  . Pneumococcal Conjugate-13 09/23/2014  . Pneumococcal Polysaccharide-23 06/02/2009  . Td 05/16/2006  .  Tdap 12/18/2016  . Zoster 05/30/2011    Screening Tests Health Maintenance  Topic Date Due  . COLONOSCOPY  11/16/2025  . TETANUS/TDAP  12/18/2026  . INFLUENZA VACCINE  Completed  . DEXA SCAN  Completed  . PNA vac Low Risk Adult  Completed      Plan:    Carolyn Hendrix , Thank you  for taking time to come for your Medicare Wellness Visit. I appreciate your ongoing commitment to your health goals. Please review the following plan we discussed and let me know if I can assist you in the future.  Please schedule your next medicare wellness visit with me in 1 yr. Continue doing brain stimulating activities (puzzles, reading, adult coloring books, staying active) to keep memory sharp.  Bring a copy of your living will and/or healthcare power of attorney to your next office visit.  Please take time to care for yourself during this time, this will help ease some stress that you are having. If you feel like your stress level gets to a point where you can not keep going then please call the ofice to schedule an appointment with Dr. Linford Arnold.  These are the goals we discussed: Goals    . Exercise 3x per week (30 min per time)     Try and walk 3 times a week for at least 30 minutes at a time. This will relieve you stress level as well.       This is a list of the screening recommended for you and due dates:  Health Maintenance  Topic Date Due  . Colon Cancer Screening  11/16/2025  . Tetanus Vaccine  12/18/2026  . Flu Shot  Completed  . DEXA scan (bone density measurement)  Completed  . Pneumonia vaccines  Completed     I have personally reviewed and noted the following in the patient's chart:   . Medical and social history . Use of alcohol, tobacco or illicit drugs  . Current medications and supplements . Functional ability and status . Nutritional status . Physical activity . Advanced directives . List of other physicians . Hospitalizations, surgeries, and ER visits in previous 12 months . Vitals . Screenings to include cognitive, depression, and falls . Referrals and appointments  In addition, I have reviewed and discussed with patient certain preventive protocols, quality metrics, and best practice recommendations. A written personalized care plan for preventive  services as well as general preventive health recommendations were provided to patient.     Normand Sloop, LPN  [id number]/56/[id number]

## 2018-11-19 NOTE — Patient Instructions (Signed)
Carolyn Hendrix , Thank you for taking time to come for your Medicare Wellness Visit. I appreciate your ongoing commitment to your health goals. Please review the following plan we discussed and let me know if I can assist you in the future.  Please schedule your next medicare wellness visit with me in 1 yr. Continue doing brain stimulating activities (puzzles, reading, adult coloring books, staying active) to keep memory sharp.  Bring a copy of your living will and/or healthcare power of attorney to your next office visit.  Please take time to care for yourself during this time, this will help ease some stress that you are having. If you feel like your stress level gets to a point where you can not keep going then please call the ofice to schedule an appointment with Dr. Madilyn Fireman.   Stress Stress is a normal reaction to life events. Stress is what you feel when life demands more than you are used to, or more than you think you can handle. Some stress can be useful, such as studying for a test or meeting a deadline at work. Stress that occurs too often or for too long can cause problems. It can affect your emotional health and interfere with relationships and normal daily activities. Too much stress can weaken your body's defense system (immune system) and increase your risk for physical illness. If you already have a medical problem, stress can make it worse. What are the causes? All sorts of life events can cause stress. An event that causes stress for one person may not be stressful for another person. Major life events, whether positive or negative, commonly cause stress. Examples include:  Losing a job or starting a new job.  Losing a loved one.  Moving to a new town or home.  Getting married or divorced.  Having a baby.  Injury or illness. Less obvious life events can also cause stress, especially if they occur day after day or in combination with each other. Examples include:  Working  long hours.  Driving in traffic.  Caring for children.  Being in debt.  Being in a difficult relationship. What are the signs or symptoms? Stress can cause emotional symptoms, including:  Anxiety. This is feeling worried, afraid, on edge, overwhelmed, or out of control.  Anger, including irritation or impatience.  Depression. This is feeling sad, down, helpless, or guilty.  Trouble focusing, remembering, or making decisions. Stress can cause physical symptoms, including:  Aches and pains. These may affect your head, neck, back, stomach, or other areas of your body.  Tight muscles or a clenched jaw.  Low energy.  Trouble sleeping. Stress can cause unhealthy behaviors, including:  Eating to feel better (overeating) or skipping meals.  Working too much or putting off tasks.  Smoking, drinking alcohol, or using drugs to feel better. How is this diagnosed? Stress is diagnosed through an assessment by your health care provider. He or she may diagnose this condition based on:  Your symptoms and any stressful life events.  Your medical history.  Tests to rule out other causes of your symptoms. Depending on your condition, your health care provider may refer you to a specialist for further evaluation. How is this treated?  Stress management techniques are the recommended treatment for stress. Medicine is not typically recommended for the treatment of stress. Techniques to reduce your reaction to stressful life events include:  Stress identification. Monitor yourself for symptoms of stress and identify what causes stress for you. These skills  may help you to avoid or prepare for stressful events.  Time management. Set your priorities, keep a calendar of events, and learn to say "no." Taking these actions can help you avoid making too many commitments. Techniques for coping with stress include:  Rethinking the problem. Try to think realistically about stressful events  rather than ignoring them or overreacting. Try to find the positives in a stressful situation rather than focusing on the negatives.  Exercise. Physical exercise can release both physical and emotional tension. The key is to find a form of exercise that you enjoy and do it regularly.  Relaxation techniques. These relax the body and mind. The key is to find one or more that you enjoy and use the technique(s) regularly. Examples include: ? Meditation, deep breathing, or progressive relaxation techniques. ? Yoga or tai chi. ? Biofeedback, mindfulness techniques, or journaling. ? Listening to music, being out in nature, or participating in other hobbies.  Practicing a healthy lifestyle. Eat a balanced diet, drink plenty of water, limit or avoid caffeine, and get plenty of sleep.  Having a strong support network. Spend time with family, friends, or other people you enjoy being around. Express your feelings and talk things over with someone you trust. Counseling or talk therapy with a mental health professional may be helpful if you are having trouble managing stress on your own. Follow these instructions at home: Lifestyle   Avoid drugs.  Do not use any products that contain nicotine or tobacco, such as cigarettes and e-cigarettes. If you need help quitting, ask your health care provider.  Limit alcohol intake to no more than 1 drink a day for nonpregnant women and 2 drinks a day for men. One drink equals 12 oz of beer, 5 oz of wine, or 1 oz of hard liquor.  Do not use alcohol or drugs to relax.  Eat a balanced diet that includes fresh fruits and vegetables, whole grains, lean meats, fish, eggs, and beans, and low-fat dairy. Avoid processed foods and foods high in added fat, sugar, and salt.  Exercise at least 30 minutes on 5 or more days each week.  Get 7-8 hours of sleep each night. General instructions   Practice stress management techniques as discussed with your health care  provider.  Drink enough fluid to keep your urine clear or pale yellow.  Take over-the-counter and prescription medicines only as told by your health care provider.  Keep all follow-up visits as told by your health care provider. This is important. Contact a health care provider if:  Your symptoms get worse.  You have new symptoms.  You feel overwhelmed by your problems and can no longer manage them on your own. Get help right away if:  You have thoughts of hurting yourself or others. If you ever feel like you may hurt yourself or others, or have thoughts about taking your own life, get help right away. You can go to your nearest emergency department or call:  Your local emergency services (911 in the [place]  A suicide crisis helpline, such as the [place] at (316)533-4278. This is open 24 hours a day. Summary  Stress is a normal reaction to life events. It can cause problems if it happens too often or for too long.  Practicing stress management techniques is the best way to treat stress.  Counseling or talk therapy with a mental health professional may be helpful if you are having trouble managing stress on your own. This information  is not intended to replace advice given to you by your health care provider. Make sure you discuss any questions you have with your health care provider. Document Released: 04/04/2001 Document Revised: 11/29/2016 Document Reviewed: 11/29/2016 Elsevier Interactive Patient Education  2019 [employer].

## 2018-11-20 ENCOUNTER — Ambulatory Visit
Admission: RE | Admit: 2018-11-20 | Discharge: 2018-11-20 | Disposition: A | Discharge status: Home / Self Care | Payer: Medicare Other | Source: Ambulatory Visit | Attending: Family Medicine | Admitting: Family Medicine

## 2018-11-20 DIAGNOSIS — R928 Other abnormal and inconclusive findings on diagnostic imaging of breast: Secondary | ICD-10-CM

## 2018-11-29 ENCOUNTER — Other Ambulatory Visit: Payer: Self-pay | Admitting: Family Medicine

## 2018-12-04 ENCOUNTER — Ambulatory Visit (INDEPENDENT_AMBULATORY_CARE_PROVIDER_SITE_OTHER): Payer: Medicare Other

## 2018-12-04 ENCOUNTER — Encounter: Payer: Self-pay | Admitting: Family Medicine

## 2018-12-04 DIAGNOSIS — M85851 Other specified disorders of bone density and structure, right thigh: Secondary | ICD-10-CM | POA: Diagnosis not present

## 2018-12-04 DIAGNOSIS — M8580 Other specified disorders of bone density and structure, unspecified site: Secondary | ICD-10-CM | POA: Insufficient documentation

## 2018-12-04 DIAGNOSIS — M810 Age-related osteoporosis without current pathological fracture: Secondary | ICD-10-CM | POA: Insufficient documentation

## 2018-12-11 ENCOUNTER — Encounter: Payer: Self-pay | Admitting: Family Medicine

## 2018-12-11 ENCOUNTER — Ambulatory Visit (INDEPENDENT_AMBULATORY_CARE_PROVIDER_SITE_OTHER): Payer: Medicare Other | Admitting: Family Medicine

## 2018-12-11 VITALS — BP 136/66 | HR 92 | Ht 66.0 in | Wt 147.0 lb

## 2018-12-11 DIAGNOSIS — Z636 Dependent relative needing care at home: Secondary | ICD-10-CM | POA: Diagnosis not present

## 2018-12-11 DIAGNOSIS — F430 Acute stress reaction: Secondary | ICD-10-CM | POA: Diagnosis not present

## 2018-12-11 LAB — LIPID PANEL
Cholesterol: 211 mg/dL — ABNORMAL HIGH (ref <200)
HDL: 47 mg/dL — ABNORMAL LOW (ref >=50)
LDL CHOLESTEROL (CALC): 139 mg/dL — AB
Non-HDL Cholesterol (Calc): 164 mg/dL (calc) — ABNORMAL HIGH (ref <130)
Total CHOL/HDL Ratio: 4.5 (calc) (ref <5.0)
Triglycerides: 130 mg/dL (ref <150)

## 2018-12-11 LAB — COMPLETE METABOLIC PANEL WITH GFR
AG RATIO: 1.4 (calc) (ref 1.0–2.5)
ALKALINE PHOSPHATASE (APISO): 93 U/L (ref 37–153)
ALT: 13 U/L (ref 6–29)
AST: 20 U/L (ref 10–35)
Albumin: 4.2 g/dL (ref 3.6–5.1)
BILIRUBIN TOTAL: 0.4 mg/dL (ref 0.2–1.2)
BUN: 20 mg/dL (ref 7–25)
CALCIUM: 9.3 mg/dL (ref 8.6–10.4)
CHLORIDE: 100 mmol/L (ref 98–110)
CO2: 34 mmol/L — ABNORMAL HIGH (ref 20–32)
Creat: 0.88 mg/dL (ref 0.60–0.93)
GFR, Est African American: 74 mL/min/{1.73_m2} (ref >=60)
GFR, Est Non African American: 64 mL/min/{1.73_m2} (ref >=60)
Globulin: 2.9 g/dL (calc) (ref 1.9–3.7)
Glucose, Bld: 71 mg/dL (ref 65–99)
POTASSIUM: 4.4 mmol/L (ref 3.5–5.3)
Sodium: 140 mmol/L (ref 135–146)
Total Protein: 7.1 g/dL (ref 6.1–8.1)

## 2018-12-11 MED ORDER — CLONAZEPAM 0.5 MG PO TABS: 0.5000 mg | ORAL_TABLET | Freq: Every day | ORAL | 1 refills | Status: DC | PRN

## 2018-12-11 NOTE — Progress Notes (Signed)
Subjective:    CC: Stress.  HPI:   78 year old female is here today to discuss her current stress and mood.  She was actually seen recently for her Medicare wellness exam and expressed to her nurse that she was feeling very stressed and overwhelmed.  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  He was hospitalized multiple times this year.  He had had a heart attack and then a stroke and has an aortic aneurysm.  She says that he was starting to recover and get little better but over the last 3 months he started to decline again.  He has several daughters who do not seem to be very supportive and understand the amount of care that he requires.  More recently he has been falling and blacking out.  They have been to the ED multiple times and he is fallen multiple times and they have not figured out exactly what is going on.  He does have follow-up with his PCP next Monday.  Starting last week she did initiate getting some nursing care in the home during the day a few days a week.  She says it is quite expensive but she is at the point where she did not know what else to do she just cannot continue to do it herself and she has chronic back problems.  She says she is having to get up often times multiple times at night and that is really disrupting her sleep and making her feel exhausted and emotionally exhausted.  She is currently taking Lexapro.  She wanted to know if there was anything that she could take just for as needed for rescue when she just feels extremely overwhelmed.  Past medical history, Surgical history, Family history not pertinant except as noted below, Social history, Allergies, and medications have been entered into the medical record, reviewed, and corrections made.   Review of Systems: No fevers, chills, night sweats, weight loss, chest pain, or shortness of breath.   Objective:    General: Well Developed, well nourished, and in no acute  distress.  Neuro: Alert and oriented x3, extra-ocular muscles intact, sensation grossly intact.  HEENT: Normocephalic, atraumatic  Skin: Warm and dry, no rashes. Cardiac: Regular rate and rhythm, no murmurs rubs or gallops, no lower extremity edema.  Respiratory: Clear to auscultation bilaterally. Not using accessory muscles, speaking in full sentences.   Impression and Recommendations:   Acute situational stress/caregiver burden-she is clearly feeling very overwhelmed.  I do think that she has made a good decision and having a caretaker come into the home.  And they may need to even consider having them available overnight as well I know it is quite expensive but he is not willing to move to a skilled nursing facility or nursing home and she is unable to provide 24/7 care for him.  Emotionally she is really struggling.  We discussed possibly working with a therapist or counselor I think it could be really helpful and supportive for her.  I want her to continue with her Lexapro.  We also discussed may be discussing with her husband's primary care doctor on Monday a palliative care consult.  They could certainly offer some care of plan for the end of life as well as some resources.  She is open to a support group so I did give her the name of [facility] in [place].  Her daughter can help her research it and get connected with them there are  some caregiver support groups actually think would be really helpful for her.  For now she is more interested in that then one-on-one counseling.  I did give her a very small quantity of clonazepam and reminded her to use it sparingly.  Also that can cause sedation and that she cannot drive with it.  Time spent 30 minutes, greater than 50% of the time spent face-to-face discussing her acute situational stress and caregiver burden.

## 2018-12-11 NOTE — Patient Instructions (Signed)
Check out [facility] [place] for support group for care-givers.  Also mention to your husband's doctor a Palliative Care Consult.

## 2019-01-03 ENCOUNTER — Other Ambulatory Visit: Payer: Self-pay | Admitting: Family Medicine

## 2019-02-01 ENCOUNTER — Other Ambulatory Visit: Payer: Self-pay | Admitting: Family Medicine

## 2019-02-06 ENCOUNTER — Other Ambulatory Visit: Payer: Self-pay | Admitting: Family Medicine

## 2019-03-12 ENCOUNTER — Other Ambulatory Visit: Payer: Self-pay | Admitting: Family Medicine

## 2019-03-12 ENCOUNTER — Telehealth: Payer: Self-pay | Admitting: Family Medicine

## 2019-03-12 NOTE — Telephone Encounter (Signed)
Please contact to schedule

## 2019-03-12 NOTE — Telephone Encounter (Signed)
Please call patient and let her know she is due for 26-month follow-up for mood.  I did go ahead and refill her medication but I would like to get her scheduled for a virtual visit.

## 2019-03-13 ENCOUNTER — Encounter: Payer: Self-pay | Admitting: Family Medicine

## 2019-03-13 ENCOUNTER — Ambulatory Visit (INDEPENDENT_AMBULATORY_CARE_PROVIDER_SITE_OTHER): Payer: Medicare Other | Admitting: Family Medicine

## 2019-03-13 VITALS — BP 130/81 | HR 98 | Ht 66.0 in | Wt 150.0 lb

## 2019-03-13 DIAGNOSIS — Z636 Dependent relative needing care at home: Secondary | ICD-10-CM | POA: Diagnosis not present

## 2019-03-13 DIAGNOSIS — F430 Acute stress reaction: Secondary | ICD-10-CM

## 2019-03-13 NOTE — Telephone Encounter (Signed)
Appointment has been scheduled.

## 2019-03-13 NOTE — Progress Notes (Signed)
Pt reports that her husband is threatening suicide. He is at a rehab facility and they are about to transfer him over to the hospital. So she is awaiting a call back to see when they will make the move.Heath Gold, CMA

## 2019-03-13 NOTE — Progress Notes (Signed)
Virtual Visit via Video Note  I connected with Carolyn Hendrix on 03/13/19 at  3:40 PM EDT by a video enabled telemedicine application and verified that I am speaking with the correct person using two identifiers.   I discussed the limitations of evaluation and management by telemedicine and the availability of in person appointments. The patient expressed understanding and agreed to proceed.  Subjective:    CC: 3 mo f/u for mood.   HPI: F/U mood. 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  Pt reports that her husband is threatening suicide. He is at a rehab facility and they are about to transfer him over to the hospital. On April he fell and broke his hip and had a partial hip replacement.  then moved to [facility]. His mood and personality has changed and he has been belligerent.  She thinks he has delerium.  So she is awaiting a call back to see when they will make the move.  Her daughter has been visiting a couple of days each week and that has really lifted her spirits.    She is currently on Lexapro 20mg  daily for depression and anxiety.  No side effects.    Past medical history, Surgical history, Family history not pertinant except as noted below, Social history, Allergies, and medications have been entered into the medical record, reviewed, and corrections made.   Review of Systems: No fevers, chills, night sweats, weight loss, chest pain, or shortness of breath.   Objective:    General: Speaking clearly in complete sentences without any shortness of breath.  Alert and oriented x3.  Normal judgment. No apparent acute distress.   Impression and Recommendations:   Acute stress - she will continue the Lexapro 20mg  . We refilled the clonazepam 0.5mg . use sparingly.  Plan for f/u in  6 months.    Caregiver burden - she is very worried about her husband and also taking care of him when he comes home  since he may have acute delerium.     Time spent 18 min in non-face to face encounter   I discussed the assessment and treatment plan with the patient. The patient was provided an opportunity to ask questions and all were answered. The patient agreed with the plan and demonstrated an understanding of the instructions.   The patient was advised to call back or seek an in-person evaluation if the symptoms worsen or if the condition fails to improve as anticipated.   Nani Gasser, MD

## 2019-03-24 ENCOUNTER — Other Ambulatory Visit: Payer: Self-pay | Admitting: Family Medicine

## 2019-04-29 ENCOUNTER — Other Ambulatory Visit: Payer: Self-pay | Admitting: Family Medicine

## 2019-05-04 ENCOUNTER — Other Ambulatory Visit: Payer: Self-pay | Admitting: Family Medicine

## 2019-05-21 ENCOUNTER — Other Ambulatory Visit: Payer: Self-pay | Admitting: Family Medicine

## 2019-06-06 ENCOUNTER — Telehealth: Payer: Self-pay | Admitting: Family Medicine

## 2019-06-06 ENCOUNTER — Encounter: Payer: Self-pay | Admitting: Family Medicine

## 2019-06-06 NOTE — Telephone Encounter (Signed)
Appointment has been made. No further questions at this time.  

## 2019-06-09 ENCOUNTER — Other Ambulatory Visit: Payer: Self-pay

## 2019-06-09 ENCOUNTER — Ambulatory Visit (INDEPENDENT_AMBULATORY_CARE_PROVIDER_SITE_OTHER): Payer: Medicare Other

## 2019-06-09 ENCOUNTER — Ambulatory Visit (INDEPENDENT_AMBULATORY_CARE_PROVIDER_SITE_OTHER): Payer: Medicare Other | Admitting: Family Medicine

## 2019-06-09 ENCOUNTER — Encounter: Payer: Self-pay | Admitting: Family Medicine

## 2019-06-09 VITALS — BP 136/72 | HR 112 | Temp 98.3°F | Wt 149.4 lb

## 2019-06-09 DIAGNOSIS — M170 Bilateral primary osteoarthritis of knee: Secondary | ICD-10-CM | POA: Diagnosis not present

## 2019-06-09 DIAGNOSIS — M25552 Pain in left hip: Secondary | ICD-10-CM

## 2019-06-09 DIAGNOSIS — M11269 Other chondrocalcinosis, unspecified knee: Secondary | ICD-10-CM

## 2019-06-09 DIAGNOSIS — M11262 Other chondrocalcinosis, left knee: Secondary | ICD-10-CM

## 2019-06-09 DIAGNOSIS — M25561 Pain in right knee: Secondary | ICD-10-CM

## 2019-06-09 DIAGNOSIS — M11261 Other chondrocalcinosis, right knee: Secondary | ICD-10-CM | POA: Diagnosis not present

## 2019-06-09 DIAGNOSIS — M25562 Pain in left knee: Secondary | ICD-10-CM

## 2019-06-09 DIAGNOSIS — M1612 Unilateral primary osteoarthritis, left hip: Secondary | ICD-10-CM

## 2019-06-09 MED ORDER — COLCHICINE 0.6 MG PO TABS: 0.6000 mg | ORAL_TABLET | Freq: Every day | ORAL | 3 refills | Status: DC | PRN

## 2019-06-09 NOTE — Patient Instructions (Addendum)
Thank you for coming in today. Call or go to the ER if you develop a large red swollen joint with extreme pain or oozing puss.  Use voltaren gel on the knees.  Recheck in 2 weeks and we can do the left hip injection.  Use colchciine as needed  Calcium Pyrophosphate Deposition Calcium pyrophosphate deposition (CPPD) is a type of arthritis that causes pain, swelling, and inflammation in a joint. Attacks of CPPD may come and go. The joint pain can be severe and may last for days to weeks. This condition usually affects one joint at a time. The knees are most often affected, but this condition can also affect the wrists, elbows, shoulders, or ankles. CPPD may also be called pseudogout because it is similar to gout. Both conditions result from the buildup of crystals in a joint. However, CPPD is caused by a type of crystal that is different from the crystals that cause gout. What are the causes? This condition is caused by the buildup of calcium pyrophosphate dihydrate crystals in a joint. The reason why this buildup occurs is not known. An increased likelihood of having this condition (predisposition) may be passed from parent to child (is hereditary). What increases the risk? This condition is more likely to develop in people who:  Are older than 76 years of age.  Have a family history of CPPD.  Have certain medical conditions, such as hemophilia, amyloidosis, or overactive parathyroid glands.  Have low levels of magnesium in the blood. What are the signs or symptoms? Symptoms of this condition include:  Joint pain. The pain may: ? Be intense and constant. ? Develop quickly. ? Get worse with movement. ? Last from several days to a few weeks.  Redness, swelling, stiffness, and warmth at the joint. How is this diagnosed? To diagnose this condition, your health care provider will use a needle to remove fluid from the joint. The fluid will be examined for the crystals that cause CPPD. You  also may have additional tests, such as:  Blood tests.  X-rays.  Ultrasound.  MRI. How is this treated? There is no way to remove the crystals from the joint and no cure for this condition. However, treatment can relieve symptoms and improve joint function. Treatment may include:  NSAIDs to reduce inflammation and pain.  Removing some of the fluid and crystals from around the joint with a needle.  Injections of medicine (cortisone) into the joint to reduce pain and swelling.  Medicines to help prevent attacks.  Physical therapy to improve joint function. Follow these instructions at home: Managing pain, stiffness, and swelling   Rest the affected joint until your symptoms start to go away.  If directed, put ice on the affected area to relieve pain and swelling: ? Put ice in a plastic bag. ? Place a towel between your skin and the bag. ? Leave the ice on for 20 minutes, 2-3 times a day.  Keep your affected joint raised (elevated) above the level of your heart, when possible. This will help to reduce swelling. For example, prop your foot up on a chair while sitting down to elevate your knee. General instructions  If the painful joint is in your leg, use crutches as told by your health care provider.  Take over-the-counter and prescription medicines only as told by your health care provider.  When your symptoms start to go away, begin to exercise regularly or do physical therapy. Talk with your health care provider or physical therapist about  what types of exercise are safe for you. Exercise that is easier on your joints (low-impact exercise) may be best. This includes walking, swimming, bicycling, and water aerobics.  Maintain a healthy weight. Excess weight puts stress on your joints.  Keep all follow-up visits as told by your health care provider and physical therapist. This is important. Contact a health care provider if you:  Notice that your symptoms get  worse.  Develop a skin rash.  Notice that your pain gets worse. Get help right away if you:  Have a fever.  Have difficulty breathing.  Are taking NSAIDs and you: ? Vomit blood. ? Have blood in your stool. ? Have stool that is tarry and black. Summary  Calcium pyrophosphate deposition (CPPD) is a type of arthritis that causes pain, swelling, and inflammation in a joint. The knees are most often affected, but it can also affect the wrists, elbows, shoulders, or ankles.  CPPD is caused by the buildup of calcium crystals in a joint. The reason why this occurs is not known.  Attacks of CPPD may come and go. The joint pain can be severe and may last for days to weeks.  There is no way to remove the crystals from the joint and no cure for this condition. However, treatment can relieve symptoms and improve joint function.  Rest the affected joint until your symptoms start to go away. After your symptoms go away, begin to exercise regularly or do physical therapy. This information is not intended to replace advice given to you by your health care provider. Make sure you discuss any questions you have with your health care provider. Document Released: 07/01/2004 Document Revised: 09/21/2017 Document Reviewed: 08/07/2017 Elsevier Patient Education  2020 [employer].

## 2019-06-09 NOTE — Progress Notes (Signed)
Carolyn Hendrix is a 78 y.o. female who presents to [facility] today for Bl Knee pain.  Patient is complaining of a several year Hx of pain in both knees. 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 Pain is chronic, worse with activity. She feels somewhat unstable, mainly in her hips, especially with squatting. Hip pain also radiates into her LLQ abdomen. She takes care of her husband at home who is very disabled from a stroke. She uses a heating pad and takes Tramadol and Naproxen for the pain, which partially manages her pain. [facility] She is asking about receiving steroid shots in her knees and has an SI injection scheduled in 1 month.    ROS:  As above  Exam:  BP 136/72 (BP Location: Left Arm, Patient Position: Sitting, Cuff Size: Normal)    Pulse (!) 112    Temp 98.3 F (36.8 C) (Oral)    Wt 149 lb 6.4 oz (67.8 kg)    BMI 24.11 kg/m  Wt Readings from Last 5 Encounters:  06/09/19 149 lb 6.4 oz (67.8 kg)  03/13/19 150 lb (68 kg)  12/11/18 147 lb (66.7 kg)  11/19/18 149 lb (67.6 kg)  11/04/18 148 lb (67.1 kg)   General: Well Developed, well nourished, and in no acute distress.  Neuro/Psych: Alert and oriented x3, extra-ocular muscles intact, able to move all 4 extremities, sensation grossly intact. Skin: Warm and dry, no rashes noted.  Respiratory: Not using accessory muscles, speaking in full sentences, trachea midline.  Cardiovascular: Pulses palpable, no extremity edema. Abdomen: Does not appear distended. MSK:  LKnee: Normal appearing. No swelling, edema, or ecchymosis. Tender to palpation along medial joint line. Normal ROM. Negative valgus, varus, anterior and posterior drawer test. RKnee: Normal appearing. No swelling, edema, or ecchymosis. Tender to palpation along medial joint line. Normal ROM. Negative valgus, varus, anterior  and posterior drawer test. Left hip: Decreased range of motion.  Pain with internal rotation and flexion.  Intact strength.   Lab and Radiology Results No results found for this or any previous visit (from the past 72 hour(s)). Dg Knee Complete 4 Views Left  Result Date: 06/09/2019 CLINICAL DATA:  Bilateral knee pain EXAM: LEFT KNEE - COMPLETE 4+ VIEW; RIGHT KNEE - COMPLETE 4+ VIEW COMPARISON:  None. FINDINGS: No fracture or dislocation of the bilateral knees. There is mild bilateral tricompartmental joint space narrowing and osteophytosis, worst in the medial compartment of the left knee. 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 No knee joint effusions. Soft tissues are unremarkable. IMPRESSION: No fracture or dislocation of the bilateral knees. There is mild bilateral tricompartmental joint space narrowing and osteophytosis, worst in the medial compartment of the left knee. 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 No knee joint effusions. Soft tissues are unremarkable. Electronically Signed   By: Eddie Candle M.D.   On: 06/09/2019 14:21   Dg Knee Complete 4 Views Right  Result Date: 06/09/2019 CLINICAL DATA:  Bilateral knee pain EXAM: LEFT KNEE - COMPLETE 4+ VIEW; RIGHT KNEE - COMPLETE 4+ VIEW COMPARISON:  None. FINDINGS: No fracture or dislocation of the bilateral knees. There is mild bilateral tricompartmental joint space narrowing and osteophytosis, worst in the medial compartment of the left knee. 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 No knee joint effusions. Soft tissues are unremarkable. IMPRESSION: No fracture or dislocation  of the bilateral knees. There is mild bilateral tricompartmental joint space narrowing and osteophytosis, worst in the medial compartment of the left knee. 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 No knee joint effusions. Soft tissues are unremarkable. Electronically Signed   By: Lauralyn PrimesAlex  Bibbey M.D.    On: 06/09/2019 14:21   Dg Hip Unilat With Pelvis 2-3 Views Left  Result Date: 06/09/2019 CLINICAL DATA:  Left hip pain, bilateral knee pain. EXAM: DG HIP (WITH OR WITHOUT PELVIS) 2-3V LEFT COMPARISON:  None. FINDINGS: Degenerative changes in the hips bilaterally with joint space narrowing and spurring, left greater than right. Subchondral cyst formation in the left femoral head. No acute bony abnormality. Specifically, no fracture, subluxation, or dislocation. IMPRESSION: Degenerative changes in the hip joints bilaterally, left greater than right. No acute bony abnormality. Electronically Signed   By: Charlett NoseKevin  Dover M.D.   On: 06/09/2019 14:27  I personally (independently) visualized and performed the interpretation of the images attached in this note.   Procedure: Real-time Ultrasound Guided Injection of right knee Device: GE Logiq E   Images permanently stored and available for review in the ultrasound unit. Verbal informed consent obtained.  Discussed risks and benefits of procedure. Warned about infection bleeding damage to structures skin hypopigmentation and fat atrophy among others. Patient expresses understanding and agreement Time-out conducted.   Noted no overlying erythema, induration, or other signs of local infection.   Skin prepped in a sterile fashion.   Local anesthesia: Topical Ethyl chloride.   With sterile technique and under real time ultrasound guidance:  40 mg of Depo-Medrol and 4 mL of Marcaine injected easily.   Completed without difficulty   Pain immediately resolved suggesting accurate placement of the medication.   Advised to call if fevers/chills, erythema, induration, drainage, or persistent bleeding.   Images permanently stored and available for review in the ultrasound unit.  Impression: Technically successful ultrasound guided injection.     Procedure: Real-time Ultrasound Guided Injection of left knee Device: GE Logiq E   Images permanently stored and  available for review in the ultrasound unit. Verbal informed consent obtained.  Discussed risks and benefits of procedure. Warned about infection bleeding damage to structures skin hypopigmentation and fat atrophy among others. Patient expresses understanding and agreement Time-out conducted.   Noted no overlying erythema, induration, or other signs of local infection.   Skin prepped in a sterile fashion.   Local anesthesia: Topical Ethyl chloride.   With sterile technique and under real time ultrasound guidance:  40 mg of Depo-Medrol and 4 mL of Marcaine injected easily.   Completed without difficulty   Pain immediately resolved suggesting accurate placement of the medication.   Advised to call if fevers/chills, erythema, induration, drainage, or persistent bleeding.   Images permanently stored and available for review in the ultrasound unit.  Impression: Technically successful ultrasound guided injection.        Assessment and Plan: 78 y.o. female with several year Hx of pain in both knees. 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 Pain is chronic, worse with activity. She feels somewhat unstable, mainly in her hips. She uses a heating pad and takes Tramadol and Naproxen for the pain, which partially manages her pain. [facility]  Left Knee: X-ray showed mild OA and opaque calcifications within the cartilage consistent with pseudogout. Performed US-guided steroid injection. Prescribed Colchicine and Diclofenac gel..  Right Knee:  X-ray showed mild OA and opaque calcifications within the cartilage consistent with pseudogout. Performed US-guided steroid injection. Prescribed Colchicine and Diclofenac gel..  Left Hip: Hip x-ray showed significant OA in left hip. Will recheck in 2 weeks and perform steroid injection in since I already injected both of her knees today.   PDMP not reviewed  this encounter. Orders Placed This Encounter  Procedures   DG Knee Complete 4 Views Left    Please include patellar sunrise, lateral, and weightbearing bilateral AP and bilateral rosenberg views    Standing Status:   Future    Number of Occurrences:   1    Standing Expiration Date:   08/08/2020    Order Specific Question:   Reason for exam:    Answer:   Please include patellar sunrise, lateral, and weightbearing bilateral AP and bilateral rosenberg views    Comments:   Please include patellar sunrise, lateral, and weightbearing bilateral AP and bilateral rosenberg views    Order Specific Question:   Preferred imaging location?    Answer:   [facility]   DG Knee Complete 4 Views Right    Please include patellar sunrise, lateral, and weightbearing bilateral AP and bilateral rosenberg views    Standing Status:   Future    Number of Occurrences:   1    Standing Expiration Date:   08/08/2020    Order Specific Question:   Reason for exam:    Answer:   Please include patellar sunrise, lateral, and weightbearing bilateral AP and bilateral rosenberg views    Comments:   Please include patellar sunrise, lateral, and weightbearing bilateral AP and bilateral rosenberg views    Order Specific Question:   Preferred imaging location?    Answer:   [facility]   DG HIP UNILAT WITH PELVIS 2-3 VIEWS LEFT    Standing Status:   Future    Number of Occurrences:   1    Standing Expiration Date:   08/08/2020    Order Specific Question:   Reason for Exam (SYMPTOM  OR DIAGNOSIS REQUIRED)    Answer:   hip pain left    Order Specific Question:   Preferred imaging location?    Answer:   [facility]     Order Specific Question:   Radiology Contrast Protocol - do NOT remove file path    Answer:   [email or web address]   Meds ordered this encounter  Medications   colchicine 0.6 MG tablet    Sig: Take 1 tablet (0.6 mg total) by mouth daily  as needed (knee pain).    Dispense:  30 tablet    Refill:  3    Historical information moved to improve visibility of documentation.  Past Medical History:  Diagnosis Date   Chronic LBP    Lumbar DDD   Colitis, ischemic (HCC)    Depression    Endometriosis    GERD (gastroesophageal reflux disease)    Hematuria    Hyperlipidemia    IBS (irritable bowel syndrome)    Fr Hurrelbrink- on Miralax and Citrucel daily   Migraines    MVP (mitral valve prolapse)    Past Surgical History:  Procedure Laterality Date   ABDOMINAL HYSTERECTOMY  1991   w/ bilat oophorectomy for endometriosis   APPENDECTOMY     BREAST BIOPSY     ESOPHAGOGASTRODUODENOSCOPY  06   fused 5th digit right hand Right    HEMILAMINOTOMY LUMBAR SPINE  11/18/1991   Dr. Lorain ChildesWilliam Bostic   lapartomy  1995  OTHER SURGICAL HISTORY  1992, 1994   right breast biopsy- benign     SPINAL FUSION  08/18/97   [facility] L[facility]-5 post fusion with iliac crest bone graft   TONSILLECTOMY AND ADENOIDECTOMY     Social History   Tobacco Use   Smoking status: Former Smoker    Types: Cigarettes    Quit date: 10/23/1980    Years since quitting: 38.6   Smokeless tobacco: Never Used  Substance Use Topics   Alcohol use: No   family history includes Alzheimer's disease in her mother; Ataxia in her sister; Other in her father.  Medications: Current Outpatient Medications  Medication Sig Dispense Refill   clonazePAM (KLONOPIN) 0.5 MG tablet TAKE 1 TABLET BY MOUTH EVERY DAY AS NEEDED FOR ANXIETY 15 tablet 1   dicyclomine (BENTYL) 10 MG capsule TAKE ONE CAPSULE 3 TIMES A DAY AS NEEDED SPASMS 270 capsule 0   escitalopram (LEXAPRO) 20 MG tablet TAKE 1 TABLET BY MOUTH EVERY DAY 90 tablet 1   glucosamine-chondroitin 500-400 MG tablet Take 1 tablet by mouth 2 (two) times daily.     naloxone (NARCAN) nasal spray 4 mg/0.1 mL Place into the nose.     omeprazole (PRILOSEC) 20 MG capsule TAKE ONE CAPSULE  EVERY DAY 90 capsule 3   pravastatin (PRAVACHOL) 40 MG tablet Take 1 tablet (40 mg total) by mouth daily. 90 tablet 3   traMADol (ULTRAM) 50 MG tablet TAKE 1 TO 2 TABLETS BY MOUTH 3 TIMES A DAY AS NEEDED FOR PAIN  1   traZODone (DESYREL) 100 MG tablet TAKE 1 TABLET BY MOUTH EVERYDAY AT BEDTIME 90 tablet 0   TURMERIC PO Take by mouth daily.     colchicine 0.6 MG tablet Take 1 tablet (0.6 mg total) by mouth daily as needed (knee pain). 30 tablet 3   No current facility-administered medications for this visit.    Allergies  Allergen Reactions   Buspar [Buspirone] Other (See Comments)    Dizziness    Cymbalta [Duloxetine Hcl] Diarrhea   Morphine       Discussed warning signs or symptoms. Please see discharge instructions. Patient expresses understanding.  I personally was present and performed or re-performed the history, physical exam and medical decision-making activities of this service and have verified that the service and findings are accurately documented in the student's note. ___________________________________________ Clementeen GrahamEvan Nhan Qualley M.D., ABFM., CAQSM. Primary Care and Sports Medicine Adjunct Instructor of Family Medicine  [employer]

## 2019-06-10 DIAGNOSIS — M11269 Other chondrocalcinosis, unspecified knee: Secondary | ICD-10-CM | POA: Insufficient documentation

## 2019-06-13 ENCOUNTER — Ambulatory Visit (INDEPENDENT_AMBULATORY_CARE_PROVIDER_SITE_OTHER): Payer: Medicare Other | Admitting: Family Medicine

## 2019-06-13 ENCOUNTER — Other Ambulatory Visit: Payer: Self-pay

## 2019-06-13 ENCOUNTER — Encounter: Payer: Self-pay | Admitting: Family Medicine

## 2019-06-13 VITALS — BP 132/68 | HR 91 | Temp 98.8°F | Ht 66.0 in | Wt 151.0 lb

## 2019-06-13 DIAGNOSIS — M961 Postlaminectomy syndrome, not elsewhere classified: Secondary | ICD-10-CM | POA: Diagnosis not present

## 2019-06-13 DIAGNOSIS — F339 Major depressive disorder, recurrent, unspecified: Secondary | ICD-10-CM | POA: Diagnosis not present

## 2019-06-13 DIAGNOSIS — Z23 Encounter for immunization: Secondary | ICD-10-CM | POA: Diagnosis not present

## 2019-06-13 NOTE — Progress Notes (Signed)
Established Patient Office Visit  Subjective:  Patient ID: Carolyn Hendrix, female    DOB: 29-Oct-1940  Age: 78 y.o. MRN: [id number]  CC:  Chief Complaint  Patient presents with  . mood    HPI VERDINE GRENFELL presents for Mood.Marland Kitchen History of major depressive disorder currently on Lexapro 20 mg and as needed clonazepam.  He does report little interest or pleasure doing things more than half the days as well as feeling down several days a week.  She also reports to appetite change and difficulty with concentration.  Anxiety wise she reports feeling nervous and on edge more than half the days as well as feeling restless and irritable occasionally.  She says she really has tried to use her clonazepam sparingly.  She says that if she starts to feel overwhelmed or stressed she will go into her spare room at the house and try to just lay down.  Her husband is still requiring home physical therapy and just really is not very motivated to get better.  She feels like she has to constantly stay on him to get up and walk and move and says it can just be exhausting at times.  [facility] with Dr. Catheryn Bacon and Mable Fill.  She has a pain stimulator.  Says she was told to discuss the clonazepam with [place] in regards to taking it with her tramadol.  They are worried about the combination medications.  Past Medical History:  Diagnosis Date  . Chronic LBP    Lumbar DDD  . Colitis, ischemic ([place])   . Depression   . Endometriosis   . GERD (gastroesophageal reflux disease)   . Hematuria   . Hyperlipidemia   . IBS (irritable bowel syndrome)    Fr Hurrelbrink- on Miralax and Citrucel daily  . Migraines   . MVP (mitral valve prolapse)     Past Surgical History:  Procedure Laterality Date  . ABDOMINAL HYSTERECTOMY  1991   w/ bilat oophorectomy for endometriosis  . APPENDECTOMY    . BREAST BIOPSY    . ESOPHAGOGASTRODUODENOSCOPY  06  . fused 5th digit right hand Right   .  HEMILAMINOTOMY LUMBAR SPINE  11/18/1991   Dr. Billey Chang  . lapartomy  1995  . OTHER SURGICAL HISTORY  1992, 1994  . right breast biopsy- benign    . SPINAL FUSION  08/18/97   Dr. Rowe Pavy at [facility], L4-5 post fusion with iliac crest bone graft  . TONSILLECTOMY AND ADENOIDECTOMY      Family History  Problem Relation Age of Onset  . Alzheimer's disease Mother   . Other Father        ACS? - 48's  . Ataxia Sister     Social History   Socioeconomic History  . Marital status: Married    Spouse name: Gwyndolyn Saxon  . Number of children: 6  . Years of education: 48  . Highest education level: 12th grade  Occupational History  . 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 Financial resource strain: Not hard at all  . Food insecurity    Worry: Never true    Inability: Never true  . Transportation needs    Medical: No    Non-medical: No  Tobacco Use  . Smoking status: Former Smoker    Types: Cigarettes    Quit date: 10/23/1980    Years since quitting: 38.6  . Smokeless tobacco:  Never Used  Substance and Sexual Activity  . Alcohol use: No  . Drug use: No  . Sexual activity: Not Currently  Lifestyle  . Physical activity    Days per week: 0 days    Minutes per session: 0 min  . Stress: Very much  Relationships  . Social connections    Talks on phone: More than three times a week    Gets together: Never    Attends religious service: More than 4 times per year    Active member of club or organization: No    Attends meetings of clubs or organizations: Never    Relationship status: Married  . Intimate partner violence    Fear of current or ex partner: No    Emotionally abused: No    Physically abused: No    Forced sexual activity: No  Other Topics Concern  . Not on file  Social History Narrative   Takes care of husband full time after he had a stroke last year. Stressed out due to this. Home health is in for right now.     Outpatient Medications Prior to Visit  Medication Sig Dispense Refill  . clonazePAM (KLONOPIN) 0.5 MG tablet TAKE 1 TABLET BY MOUTH EVERY DAY AS NEEDED FOR ANXIETY 15 tablet 1  . dicyclomine (BENTYL) 10 MG capsule TAKE ONE CAPSULE 3 TIMES A DAY AS NEEDED SPASMS 270 capsule 0  . escitalopram (LEXAPRO) 20 MG tablet TAKE 1 TABLET BY MOUTH EVERY DAY 90 tablet 1  . glucosamine-chondroitin 500-400 MG tablet Take 1 tablet by mouth 2 (two) times daily.    . naloxone (NARCAN) nasal spray 4 mg/0.1 mL Place into the nose.    Marland Kitchen. omeprazole (PRILOSEC) 20 MG capsule TAKE ONE CAPSULE EVERY DAY 90 capsule 3  . pravastatin (PRAVACHOL) 40 MG tablet Take 1 tablet (40 mg total) by mouth daily. 90 tablet 3  . traMADol (ULTRAM) 50 MG tablet TAKE 1 TO 2 TABLETS BY MOUTH 3 TIMES A DAY AS NEEDED FOR PAIN  1  . traZODone (DESYREL) 100 MG tablet TAKE 1 TABLET BY MOUTH EVERYDAY AT BEDTIME 90 tablet 0  . TURMERIC PO Take by mouth daily.    . colchicine 0.6 MG tablet Take 1 tablet (0.6 mg total) by mouth daily as needed (knee pain). (Patient not taking: Reported on 06/13/2019) 30 tablet 3   No facility-administered medications prior to visit.     Allergies  Allergen Reactions  . Buspar [Buspirone] Other (See Comments)    Dizziness   . Cymbalta [Duloxetine Hcl] Diarrhea  . Morphine     ROS Review of Systems    Objective:    Physical Exam  BP 132/68   Pulse 91   Temp 98.8 F (37.1 C)   Ht 5\' 6"  (1.676 m)   Wt 151 lb (68.5 kg)   SpO2 95%   BMI 24.37 kg/m  Wt Readings from Last 3 Encounters:  06/13/19 151 lb (68.5 kg)  06/09/19 149 lb 6.4 oz (67.8 kg)  03/13/19 150 lb (68 kg)     Health Maintenance Due  Topic Date Due  . INFLUENZA VACCINE  05/24/2019    There are no preventive care reminders to display for this patient.  Lab Results  Component Value Date   TSH 1.81 11/16/2017   Lab Results  Component Value Date   WBC 5.7 11/10/2016   HGB 12.4 11/10/2016   HCT 38.6 11/10/2016   MCV  94.1 11/10/2016   PLT 339 11/10/2016  Lab Results  Component Value Date   NA 140 11/04/2018   K 4.4 11/04/2018   CO2 34 (H) 11/04/2018   GLUCOSE 71 11/04/2018   BUN 20 11/04/2018   CREATININE 0.88 11/04/2018   BILITOT 0.4 11/04/2018   ALKPHOS 80 11/10/2016   AST 20 11/04/2018   ALT 13 11/04/2018   PROT 7.1 11/04/2018   ALBUMIN 4.3 11/10/2016   CALCIUM 9.3 11/04/2018   Lab Results  Component Value Date   CHOL 211 (H) 11/04/2018   Lab Results  Component Value Date   HDL 47 (L) 11/04/2018   Lab Results  Component Value Date   LDLCALC 139 (H) 11/04/2018   Lab Results  Component Value Date   TRIG 130 11/04/2018   Lab Results  Component Value Date   CHOLHDL 4.5 11/04/2018   No results found for: HGBA1C    Assessment & Plan:   Problem List Items Addressed This Visit      Other   Postlaminectomy syndrome, not elsewhere classified    We did discuss the issue with the clonazepam mixing it with opioids and tramadol and the increased risk of death and that combination.  She does not use the medication daily and really has been trying to do some coping strategies on her own without having to take the medication we discussed continuing to work at that and trying to reduce the use of the clonazepam more and more till she is able to get to a point where she is not using it at all.       Major depression, recurrent, chronic (HCC) - Primary    PHQ 9 score of 6 and gad 7 score of 5.  Overall she is doing okay and much better than when I saw her a few months ago.  For now we will continue with Lexapro.  We did discuss the issue with the clonazepam mixing it with opioids and tramadol and the increased risk of death and that combination.  She does not use the medication daily and really has been trying to do some coping strategies on her own without having to take the medication we discussed continuing to work at that and trying to reduce the use of the clonazepam more and more till  she is able to get to a point where she is not using it at all.  Follow-up in 6 months.       Other Visit Diagnoses    Need for immunization against influenza       Relevant Orders   Flu Vaccine QUAD High Dose(Fluad) (Completed)     Vaccine given today.  No orders of the defined types were placed in this encounter.   Follow-up: Return in about 6 months (around 12/14/2019) for Mood .    Nani Gasseratherine Johann Santone, MD

## 2019-06-13 NOTE — Assessment & Plan Note (Addendum)
PHQ 9 score of 6 and gad 7 score of 5.  Overall she is doing okay and much better than when I saw her a few months ago.  For now we will continue with Lexapro.  We did discuss the issue with the clonazepam mixing it with opioids and tramadol and the increased risk of death and that combination.  She does not use the medication daily and really has been trying to do some coping strategies on her own without having to take the medication we discussed continuing to work at that and trying to reduce the use of the clonazepam more and more till she is able to get to a point where she is not using it at all.  Follow-up in 6 months.

## 2019-06-13 NOTE — Assessment & Plan Note (Signed)
We did discuss the issue with the clonazepam mixing it with opioids and tramadol and the increased risk of death and that combination.  She does not use the medication daily and really has been trying to do some coping strategies on her own without having to take the medication we discussed continuing to work at that and trying to reduce the use of the clonazepam more and more till she is able to get to a point where she is not using it at all.

## 2019-06-23 ENCOUNTER — Encounter: Payer: Self-pay | Admitting: Family Medicine

## 2019-06-23 ENCOUNTER — Ambulatory Visit (INDEPENDENT_AMBULATORY_CARE_PROVIDER_SITE_OTHER): Payer: Medicare Other | Admitting: Family Medicine

## 2019-06-23 ENCOUNTER — Other Ambulatory Visit: Payer: Self-pay

## 2019-06-23 VITALS — BP 128/69 | HR 85 | Temp 98.3°F | Wt 149.0 lb

## 2019-06-23 DIAGNOSIS — M25552 Pain in left hip: Secondary | ICD-10-CM | POA: Diagnosis not present

## 2019-06-23 NOTE — Progress Notes (Signed)
Carolyn Hendrix is a 78 y.o. female who presents to [facility] today for knee and hip pain.  Patient was seen in 17 August for bilateral knee pain and left hip pain.  She had bilateral knee injections on her initial visit on August 17 with a plan to proceed with left intra-articular hip injection on recheck today.  In the interim she notes that her knee pain has improved significantly following injections.  She is ready to have a left hip injection today.    ROS:  As above  Exam:  BP 128/69   Pulse 85   Temp 98.3 F (36.8 C) (Oral)   Wt 149 lb (67.6 kg)   BMI 24.05 kg/m    Wt Readings from Last 5 Encounters:  06/23/19 149 lb (67.6 kg)  06/13/19 151 lb (68.5 kg)  06/09/19 149 lb 6.4 oz (67.8 kg)  03/13/19 150 lb (68 kg)  12/11/18 147 lb (66.7 kg)   General: Well Developed, well nourished, and in no acute distress.  Neuro/Psych: Alert and oriented x3, extra-ocular muscles intact, able to move all 4 extremities, sensation grossly intact. Skin: Warm and dry, no rashes noted.  Respiratory: Not using accessory muscles, speaking in full sentences, trachea midline.  Cardiovascular: Pulses palpable, no extremity edema. Abdomen: Does not appear distended. MSK: Left hip normal.  No skin change or erythema.  Decreased range of motion.   Procedure: Real-time Ultrasound Guided Injection of left hip femoral acetabular joint Device: GE Logiq E   Images permanently stored and available for review in the ultrasound unit. Verbal informed consent obtained.  Discussed risks and benefits of procedure. Warned about infection bleeding damage to structures skin hypopigmentation and fat atrophy among others. Patient expresses understanding and agreement Time-out conducted.   Noted no overlying erythema, induration, or other signs of local infection.   Skin prepped in a sterile fashion.   Local anesthesia: Topical Ethyl chloride.   With sterile  technique and under real time ultrasound guidance:  40 mg of Depo-Medrol and 4 mL of Marcaine injected easily.   Completed without difficulty   Pain immediately resolved suggesting accurate placement of the medication.   Advised to call if fevers/chills, erythema, induration, drainage, or persistent bleeding.   Images permanently stored and available for review in the ultrasound unit.  Impression: Technically successful ultrasound guided injection.         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  Plan to proceed with injection as above.  This injection was previously arranged.  No charge for office visit.  Recheck as needed for return of pain.  Continue activity as tolerated.   PDMP not reviewed this encounter. No orders of the defined types were placed in this encounter.  No orders of the defined types were placed in this encounter.   Historical information moved to improve visibility of documentation.  Past Medical History:  Diagnosis Date  . Chronic LBP    Lumbar DDD  . Colitis, ischemic ([place])   . Depression   . Endometriosis   . GERD (gastroesophageal reflux disease)   . Hematuria   . Hyperlipidemia   . IBS (irritable bowel syndrome)    Fr Hurrelbrink- on Miralax and Citrucel daily  . Migraines   . MVP (mitral valve prolapse)    Past Surgical History:  Procedure Laterality Date  . ABDOMINAL HYSTERECTOMY  1991   w/ bilat oophorectomy for endometriosis  . APPENDECTOMY    . BREAST  BIOPSY    . ESOPHAGOGASTRODUODENOSCOPY  06  . fused 5th digit right hand Right   . HEMILAMINOTOMY LUMBAR SPINE  11/18/1991   Dr. Lorain ChildesWilliam Bostic  . lapartomy  1995  . OTHER SURGICAL HISTORY  1992, 1994  . right breast biopsy- benign    . SPINAL FUSION  08/18/97   [facility], L4-5 post fusion with iliac crest bone graft  . TONSILLECTOMY AND ADENOIDECTOMY     Social History   Tobacco Use  . Smoking status: Former Smoker    Types: Cigarettes    Quit date:  10/23/1980    Years since quitting: 38.6  . Smokeless tobacco: Never Used  Substance Use Topics  . Alcohol use: No   family history includes Alzheimer's disease in her mother; Ataxia in her sister; Other in her father.  Medications: Current Outpatient Medications  Medication Sig Dispense Refill  . clonazePAM (KLONOPIN) 0.5 MG tablet TAKE 1 TABLET BY MOUTH EVERY DAY AS NEEDED FOR ANXIETY 15 tablet 1  . colchicine 0.6 MG tablet Take 1 tablet (0.6 mg total) by mouth daily as needed (knee pain). 30 tablet 3  . dicyclomine (BENTYL) 10 MG capsule TAKE ONE CAPSULE 3 TIMES A DAY AS NEEDED SPASMS 270 capsule 0  . escitalopram (LEXAPRO) 20 MG tablet TAKE 1 TABLET BY MOUTH EVERY DAY 90 tablet 1  . glucosamine-chondroitin 500-400 MG tablet Take 1 tablet by mouth 2 (two) times daily.    . naloxone (NARCAN) nasal spray 4 mg/0.1 mL Place into the nose.    Marland Kitchen. omeprazole (PRILOSEC) 20 MG capsule TAKE ONE CAPSULE EVERY DAY 90 capsule 3  . pravastatin (PRAVACHOL) 40 MG tablet Take 1 tablet (40 mg total) by mouth daily. 90 tablet 3  . traMADol (ULTRAM) 50 MG tablet TAKE 1 TO 2 TABLETS BY MOUTH 3 TIMES A DAY AS NEEDED FOR PAIN  1  . traZODone (DESYREL) 100 MG tablet TAKE 1 TABLET BY MOUTH EVERYDAY AT BEDTIME 90 tablet 0  . TURMERIC PO Take by mouth daily.     No current facility-administered medications for this visit.    Allergies  Allergen Reactions  . Buspar [Buspirone] Other (See Comments)    Dizziness   . Cymbalta [Duloxetine Hcl] Diarrhea  . Morphine       Discussed warning signs or symptoms. Please see discharge instructions. Patient expresses understanding.

## 2019-06-23 NOTE — Patient Instructions (Signed)
Thank you for coming in today.   Call or go to the ER if you develop a large red swollen joint with extreme pain or oozing puss.   

## 2019-07-03 ENCOUNTER — Encounter: Payer: Self-pay | Admitting: Family Medicine

## 2019-07-04 MED ORDER — BUPROPION HCL ER (XL) 150 MG PO TB24: 150.0000 mg | ORAL_TABLET | ORAL | 0 refills | Status: DC

## 2019-07-04 NOTE — Telephone Encounter (Signed)
To PCP as FYI

## 2019-07-17 ENCOUNTER — Encounter: Payer: Medicare Other | Admitting: Family Medicine

## 2019-07-17 ENCOUNTER — Ambulatory Visit (INDEPENDENT_AMBULATORY_CARE_PROVIDER_SITE_OTHER): Payer: Medicare Other | Admitting: Family Medicine

## 2019-07-17 ENCOUNTER — Encounter: Payer: Self-pay | Admitting: Family Medicine

## 2019-07-17 DIAGNOSIS — F339 Major depressive disorder, recurrent, unspecified: Secondary | ICD-10-CM

## 2019-07-17 DIAGNOSIS — M4727 Other spondylosis with radiculopathy, lumbosacral region: Secondary | ICD-10-CM | POA: Diagnosis not present

## 2019-07-17 MED ORDER — BACLOFEN 10 MG PO TABS: 10.0000 mg | ORAL_TABLET | Freq: Every evening | ORAL | 0 refills | Status: DC | PRN

## 2019-07-17 MED ORDER — BUPROPION HCL ER (XL) 150 MG PO TB24: 150.0000 mg | ORAL_TABLET | ORAL | 0 refills | Status: DC

## 2019-07-17 MED ORDER — CLONAZEPAM 0.5 MG PO TABS: 0.5000 mg | ORAL_TABLET | Freq: Every day | ORAL | 1 refills | Status: DC

## 2019-07-17 NOTE — Progress Notes (Signed)
Virtual Visit via Telephone Note  I connected with Carolyn Hendrix on 07/17/19 at  8:50 AM EDT by telephone and verified that I am speaking with the correct person using two identifiers.   I discussed the limitations, risks, security and privacy concerns of performing an evaluation and management service by telephone and the availability of in person appointments. I also discussed with the patient that there may be a patient responsible charge related to this service. The patient expressed understanding and agreed to proceed.     Established Patient Office Visit  Subjective:  Patient ID: Carolyn Hendrix, female    DOB: 12/21/1940  Age: 78 y.o. MRN: [id number]  CC: No chief complaint on file.   HPI Carolyn Hendrix presents for f/u Mood.  She actually called back recently and let us know that her stress levels were really just ramped up and she felt like the Lexapro by itself was not controlling things.  She had actually taken Wellbutrin years ago in combination with another medication and wondered if she could restart it.  She has been on it now for about 2 weeks and so far is actually doing really well she has not had any side effects on the medication she feels like it actually has been helpful.  She is on the 150 mg extended release daily.  She also wanted to discuss her clonazepam.  She understands that there is a risk of taking benzodiazepines with chronic pain medications.  She is currently on tramadol for pain but says that she is just at the point where it really does seem to help her especially if she takes 1 first and in the morning and her quality of life is more important to her than the potential risks.  As far as her chronic back pain and lumbosacral spondylosis.  She says that the tizanidine that she is given just is a little too sedating and it gives her extremely dry mouth so she just does not take it.  She says she was told that there really are any other muscle relaxers that she  can use that are safe  Husband with fever and fell last night and had to call EMS.  She says that her husband could barely stand up and was very weak yesterday.  Her family was there.  The hospital says he did test Negative for COVID but they are trying to figure out what is going on with him.  Past Medical History:  Diagnosis Date  . Chronic LBP    Lumbar DDD  . Colitis, ischemic ([place])   . Depression   . Endometriosis   . GERD (gastroesophageal reflux disease)   . Hematuria   . Hyperlipidemia   . IBS (irritable bowel syndrome)    Fr Hurrelbrink- on Miralax and Citrucel daily  . Migraines   . MVP (mitral valve prolapse)     Past Surgical History:  Procedure Laterality Date  . ABDOMINAL HYSTERECTOMY  1991   w/ bilat oophorectomy for endometriosis  . APPENDECTOMY    . BREAST BIOPSY    . ESOPHAGOGASTRODUODENOSCOPY  06  . fused 5th digit right hand Right   . HEMILAMINOTOMY LUMBAR SPINE  11/18/1991   Dr. Billey Chang  . lapartomy  1995  . OTHER SURGICAL HISTORY  1992, 1994  . right breast biopsy- benign    . SPINAL FUSION  08/18/97   [facility] Rowe Pavy [facility], L4-5 post fusion with iliac crest bone graft  . TONSILLECTOMY AND ADENOIDECTOMY  Family History  Problem Relation Age of Onset  . Alzheimer's disease Mother   . Other Father        ACS? - 1980's  . Ataxia Sister     Social History   Socioeconomic History  . Marital status: Married    Spouse name: [name]  . Number of children: 6  . Years of education: 1112  . Highest education level: 12th grade  Occupational History  . Occupation: retired    Comment: Print production planneroffice manager [place]  Social Needs  . Financial resource strain: Not hard at all  . Food insecurity    Worry: Never true    Inability: Never true  . Transportation needs    Medical: No    Non-medical: No  Tobacco Use  . Smoking status: Former Smoker    Types: Cigarettes    Quit date: 10/23/1980    Years since quitting: 38.7  . Smokeless  tobacco: Never Used  Substance and Sexual Activity  . Alcohol use: No  . Drug use: No  . Sexual activity: Not Currently  Lifestyle  . Physical activity    Days per week: 0 days    Minutes per session: 0 min  . Stress: Very much  Relationships  . Social connections    Talks on phone: More than three times a week    Gets together: Never    Attends religious service: More than 4 times per year    Active member of club or organization: No    Attends meetings of clubs or organizations: Never    Relationship status: Married  . Intimate partner violence    Fear of current or ex partner: No    Emotionally abused: No    Physically abused: No    Forced sexual activity: No  Other Topics Concern  . Not on file  Social History Narrative   Takes care of husband full time after he had a stroke last year. Stressed out due to this. Home health is in for right now.    Outpatient Medications Prior to Visit  Medication Sig Dispense Refill  . colchicine 0.6 MG tablet Take 1 tablet (0.6 mg total) by mouth daily as needed (knee pain). 30 tablet 3  . dicyclomine (BENTYL) 10 MG capsule TAKE ONE CAPSULE 3 TIMES A DAY AS NEEDED SPASMS 270 capsule 0  . escitalopram (LEXAPRO) 20 MG tablet TAKE 1 TABLET BY MOUTH EVERY DAY 90 tablet 1  . glucosamine-chondroitin 500-400 MG tablet Take 1 tablet by mouth 2 (two) times daily.    . naloxone (NARCAN) nasal spray 4 mg/0.1 mL Place into the nose.    Marland Kitchen. omeprazole (PRILOSEC) 20 MG capsule TAKE ONE CAPSULE EVERY DAY 90 capsule 3  . pravastatin (PRAVACHOL) 40 MG tablet Take 1 tablet (40 mg total) by mouth daily. 90 tablet 3  . traMADol (ULTRAM) 50 MG tablet TAKE 1 TO 2 TABLETS BY MOUTH 3 TIMES A DAY AS NEEDED FOR PAIN  1  . traZODone (DESYREL) 100 MG tablet TAKE 1 TABLET BY MOUTH EVERYDAY AT BEDTIME 90 tablet 0  . TURMERIC PO Take by mouth daily.    Marland Kitchen. buPROPion (WELLBUTRIN XL) 150 MG 24 hr tablet Take 1 tablet (150 mg total) by mouth every morning. 30 tablet 0  .  clonazePAM (KLONOPIN) 0.5 MG tablet TAKE 1 TABLET BY MOUTH EVERY DAY AS NEEDED FOR ANXIETY 15 tablet 1   No facility-administered medications prior to visit.     Allergies  Allergen  Reactions  . Buspar [Buspirone] Other (See Comments)    Dizziness   . Cymbalta [Duloxetine Hcl] Diarrhea  . Morphine     ROS Review of Systems    Objective:    Physical Exam  There were no vitals taken for this visit. Wt Readings from Last 3 Encounters:  06/23/19 149 lb (67.6 kg)  06/13/19 151 lb (68.5 kg)  06/09/19 149 lb 6.4 oz (67.8 kg)     There are no preventive care reminders to display for this patient.  There are no preventive care reminders to display for this patient.  Lab Results  Component Value Date   TSH 1.81 11/16/2017   Lab Results  Component Value Date   WBC 5.7 11/10/2016   HGB 12.4 11/10/2016   HCT 38.6 11/10/2016   MCV 94.1 11/10/2016   PLT 339 11/10/2016   Lab Results  Component Value Date   NA 140 11/04/2018   K 4.4 11/04/2018   CO2 34 (H) 11/04/2018   GLUCOSE 71 11/04/2018   BUN 20 11/04/2018   CREATININE 0.88 11/04/2018   BILITOT 0.4 11/04/2018   ALKPHOS 80 11/10/2016   AST 20 11/04/2018   ALT 13 11/04/2018   PROT 7.1 11/04/2018   ALBUMIN 4.3 11/10/2016   CALCIUM 9.3 11/04/2018   Lab Results  Component Value Date   CHOL 211 (H) 11/04/2018   Lab Results  Component Value Date   HDL 47 (L) 11/04/2018   Lab Results  Component Value Date   LDLCALC 139 (H) 11/04/2018   Lab Results  Component Value Date   TRIG 130 11/04/2018   Lab Results  Component Value Date   CHOLHDL 4.5 11/04/2018   No results found for: HGBA1C    Assessment & Plan:   Problem List Items Addressed This Visit      Musculoskeletal and Integument   Lumbosacral spondylosis - Primary    We did discuss that muscle relaxers are considered high risk because they can increase memory problems and cause sedation as well as urinary retention etc.  Since the tizanidine it  causes extreme dry mouth and excess sedation we could try baclofen I do not think it is unreasonable.  New prescription sent to pharmacy.  If not helpful then we will just discontinue.  It may also be the sedative effect that probably helps her more than anything.      Relevant Medications   baclofen (LIORESAL) 10 MG tablet     Other   Major depression, recurrent, chronic (HCC)    She is actually doing well with combination of Wellbutrin and Lexapro.  Continue current regimen.  Went ahead and send over 90-day supply on the bupropion.  Follow back up in 3 to 4 months to make sure that she is doing well.  Hopefully her husband is okay it sounds like he probably has some type of systemic infection going on      Relevant Medications   buPROPion (WELLBUTRIN XL) 150 MG 24 hr tablet      Meds ordered this encounter  Medications  . clonazePAM (KLONOPIN) 0.5 MG tablet    Sig: Take 1 tablet (0.5 mg total) by mouth daily.    Dispense:  30 tablet    Refill:  1    Not to exceed 4 additional fills before 09/08/2019  . baclofen (LIORESAL) 10 MG tablet    Sig: Take 1 tablet (10 mg total) by mouth at bedtime as needed for muscle spasms.    Dispense:  30  each    Refill:  0  . buPROPion (WELLBUTRIN XL) 150 MG 24 hr tablet    Sig: Take 1 tablet (150 mg total) by mouth every morning.    Dispense:  90 tablet    Refill:  0    Follow-up: Return in about 3 months (around 10/16/2019) for Mood.   I discussed the assessment and treatment plan with the patient. The patient was provided an opportunity to ask questions and all were answered. The patient agreed with the plan and demonstrated an understanding of the instructions.   The patient was advised to call back or seek an in-person evaluation if the symptoms worsen or if the condition fails to improve as anticipated.  I provided 20 minutes of non-face-to-face time during this encounter.   Nani Gasser, MD  Nani Gasser, MD

## 2019-07-17 NOTE — Assessment & Plan Note (Signed)
She is actually doing well with combination of Wellbutrin and Lexapro.  Continue current regimen.  Went ahead and send over 90-day supply on the bupropion.  Follow back up in 3 to 4 months to make sure that she is doing well.  Hopefully her husband is okay it sounds like he probably has some type of systemic infection going on

## 2019-07-17 NOTE — Assessment & Plan Note (Signed)
We did discuss that muscle relaxers are considered high risk because they can increase memory problems and cause sedation as well as urinary retention etc.  Since the tizanidine it causes extreme dry mouth and excess sedation we could try baclofen I do not think it is unreasonable.  New prescription sent to pharmacy.  If not helpful then we will just discontinue.  It may also be the sedative effect that probably helps her more than anything.

## 2019-07-17 NOTE — Progress Notes (Signed)
Error

## 2019-07-18 DIAGNOSIS — F430 Acute stress reaction: Secondary | ICD-10-CM | POA: Insufficient documentation

## 2019-07-23 ENCOUNTER — Other Ambulatory Visit: Payer: Self-pay | Admitting: Family Medicine

## 2019-08-08 ENCOUNTER — Other Ambulatory Visit: Payer: Self-pay | Admitting: Family Medicine

## 2019-08-22 ENCOUNTER — Other Ambulatory Visit: Payer: Self-pay | Admitting: Family Medicine

## 2019-09-02 ENCOUNTER — Other Ambulatory Visit: Payer: Self-pay | Admitting: Family Medicine

## 2019-09-20 ENCOUNTER — Other Ambulatory Visit: Payer: Self-pay | Admitting: Family Medicine

## 2019-09-23 ENCOUNTER — Ambulatory Visit (INDEPENDENT_AMBULATORY_CARE_PROVIDER_SITE_OTHER): Payer: Medicare Other

## 2019-09-23 ENCOUNTER — Other Ambulatory Visit: Payer: Self-pay

## 2019-09-23 ENCOUNTER — Encounter: Payer: Self-pay | Admitting: Family Medicine

## 2019-09-23 ENCOUNTER — Ambulatory Visit (INDEPENDENT_AMBULATORY_CARE_PROVIDER_SITE_OTHER): Payer: Medicare Other | Admitting: Family Medicine

## 2019-09-23 VITALS — BP 138/78 | HR 101 | Ht 66.0 in | Wt 151.0 lb

## 2019-09-23 DIAGNOSIS — R1031 Right lower quadrant pain: Secondary | ICD-10-CM

## 2019-09-23 DIAGNOSIS — M25551 Pain in right hip: Secondary | ICD-10-CM

## 2019-09-23 MED ORDER — KETOROLAC TROMETHAMINE 60 MG/2ML IM SOLN
60.0000 mg | Freq: Once | INTRAMUSCULAR | Status: AC
  Administered 2019-09-23: 15:00:00 60 mg via INTRAMUSCULAR

## 2019-09-23 NOTE — Progress Notes (Signed)
  Pt reports that her sxs began on Sunday. She has R sided abdominal and pelvic pain. She rates her pain 7/10 and stated that if she moves or bends the pain is sharp.

## 2019-09-23 NOTE — Progress Notes (Signed)
Acute Office Visit  Subjective:    Patient ID: Carolyn Hendrix, female    DOB: [birth year], 78 y.o.   MRN: [id number]  Chief Complaint  Patient presents with  . Abdominal Pain    HPI Patient is in today for for RUQ pain x 3 days.  It is just in the soft part of the abdomen right above the groin crease.  She feels like the pain is worse with movement.  At rest it is about a 7 out of 10 but it is more intense with movement.  She is not had any fevers or chills.  No injury.  She says in fact over Thanksgiving her family did most of the decorating etc. and she did not do a lot of bending twisting or squatting.  She has been having some sour belches.  She took some magnesium citrate to help her bowels move yesterday.  She also doubled up her dose on her Bentyl, thinking it would help. Pain is 7/10. Pain is sharp when she moves.   No nausea or vomiting.   She has a more liquid BM today.    Past Medical History:  Diagnosis Date  . Chronic LBP    Lumbar DDD  . Colitis, ischemic (HCC)   . Depression   . Endometriosis   . GERD (gastroesophageal reflux disease)   . Hematuria   . Hyperlipidemia   . IBS (irritable bowel syndrome)    Fr Hurrelbrink- on Miralax and Citrucel daily  . Migraines   . MVP (mitral valve prolapse)     Past Surgical History:  Procedure Laterality Date  . ABDOMINAL HYSTERECTOMY  1991   w/ bilat oophorectomy for endometriosis  . APPENDECTOMY    . BREAST BIOPSY    . ESOPHAGOGASTRODUODENOSCOPY  06  . fused 5th digit right hand Right   . HEMILAMINOTOMY LUMBAR SPINE  11/18/1991   Dr. Lorain ChildesWilliam Bostic  . lapartomy  1995  . OTHER SURGICAL HISTORY  1992, 1994  . right breast biopsy- benign    . SPINAL FUSION  08/18/97   [facility], L4-5 post fusion with iliac crest bone graft  . TONSILLECTOMY AND ADENOIDECTOMY      Family History  Problem Relation Age of Onset  . Alzheimer's disease Mother   . Other Father        ACS? - 8180's  . Ataxia Sister      Social History   Socioeconomic History  . Marital status: Married    Spouse name: [name]  . Number of children: 6  . Years of education: 2412  . Highest education level: 12th grade  Occupational History  . Occupation: retired    Comment: Print production planneroffice manager [place]  Social Needs  . Financial resource strain: Not hard at all  . Food insecurity    Worry: Never true    Inability: Never true  . Transportation needs    Medical: No    Non-medical: No  Tobacco Use  . Smoking status: Former Smoker    Types: Cigarettes    Quit date: 10/23/1980    Years since quitting: 38.9  . Smokeless tobacco: Never Used  Substance and Sexual Activity  . Alcohol use: No  . Drug use: No  . Sexual activity: Not Currently  Lifestyle  . Physical activity    Days per week: 0 days    Minutes per session: 0 min  . Stress: Very much  Relationships  . Social connections  Talks on phone: More than three times a week    Gets together: Never    Attends religious service: More than 4 times per year    Active member of club or organization: No    Attends meetings of clubs or organizations: Never    Relationship status: Married  . Intimate partner violence    Fear of current or ex partner: No    Emotionally abused: No    Physically abused: No    Forced sexual activity: No  Other Topics Concern  . Not on file  Social History Narrative   Takes care of husband full time after he had a stroke last year. Stressed out due to this. Home health is in for right now.    Outpatient Medications Prior to Visit  Medication Sig Dispense Refill  . baclofen (LIORESAL) 10 MG tablet TAKE 1 TABLET BY MOUTH AT BEDTIME AS NEEDED FOR MUSCLE SPASMS 30 tablet 0  . buPROPion (WELLBUTRIN XL) 150 MG 24 hr tablet Take 1 tablet (150 mg total) by mouth every morning. 90 tablet 0  . clonazePAM (KLONOPIN) 0.5 MG tablet TAKE 1 TABLET BY MOUTH EVERY DAY 30 tablet 1  . colchicine 0.6 MG tablet Take 1 tablet (0.6 mg  total) by mouth daily as needed (knee pain). 30 tablet 3  . dicyclomine (BENTYL) 10 MG capsule TAKE ONE CAPSULE 3 TIMES A DAY AS NEEDED SPASMS 270 capsule 0  . escitalopram (LEXAPRO) 20 MG tablet TAKE 1 TABLET BY MOUTH EVERY DAY 90 tablet 1  . glucosamine-chondroitin 500-400 MG tablet Take 1 tablet by mouth 2 (two) times daily.    . naloxone (NARCAN) nasal spray 4 mg/0.1 mL Place into the nose.    Marland Kitchen omeprazole (PRILOSEC) 20 MG capsule TAKE ONE CAPSULE EVERY DAY 90 capsule 3  . pravastatin (PRAVACHOL) 40 MG tablet Take 1 tablet (40 mg total) by mouth daily. 90 tablet 3  . traMADol (ULTRAM) 50 MG tablet TAKE 1 TO 2 TABLETS BY MOUTH 3 TIMES A DAY AS NEEDED FOR PAIN  1  . traZODone (DESYREL) 100 MG tablet TAKE 1 TABLET BY MOUTH EVERYDAY AT BEDTIME 90 tablet 0  . TURMERIC PO Take by mouth daily.     No facility-administered medications prior to visit.     Allergies  Allergen Reactions  . Buspar [Buspirone] Other (See Comments)    Dizziness   . Cymbalta [Duloxetine Hcl] Diarrhea  . Morphine     ROS     Objective:    Physical Exam  Constitutional: She is oriented to person, place, and time. She appears well-developed and well-nourished.  HENT:  Head: Normocephalic and atraumatic.  Eyes: Conjunctivae and EOM are normal.  Cardiovascular: Normal rate.  Pulmonary/Chest: Effort normal.  Abdominal: Soft. Bowel sounds are normal. She exhibits no distension and no mass. There is no abdominal tenderness. There is no rebound and no guarding.  Musculoskeletal:     Comments: Right hip with normal range of motion.  But she has pain with hip flexion.  She also has significant discomfort with internal rotation of the hip.  Neurological: She is alert and oriented to person, place, and time.  Skin: Skin is dry. No pallor.  Psychiatric: She has a normal mood and affect. Her behavior is normal.  Vitals reviewed.   BP 138/78   Pulse (!) 101   Ht 5\' 6"  (1.676 m)   Wt 151 lb (68.5 kg)   SpO2 97%    BMI 24.37 kg/m  Wt Readings  from Last 3 Encounters:  09/23/19 151 lb (68.5 kg)  06/23/19 149 lb (67.6 kg)  06/13/19 151 lb (68.5 kg)    There are no preventive care reminders to display for this patient.  There are no preventive care reminders to display for this patient.   Lab Results  Component Value Date   TSH 1.81 11/16/2017   Lab Results  Component Value Date   WBC 5.7 11/10/2016   HGB 12.4 11/10/2016   HCT 38.6 11/10/2016   MCV 94.1 11/10/2016   PLT 339 11/10/2016   Lab Results  Component Value Date   NA 140 11/04/2018   K 4.4 11/04/2018   CO2 34 (H) 11/04/2018   GLUCOSE 71 11/04/2018   BUN 20 11/04/2018   CREATININE 0.88 11/04/2018   BILITOT 0.4 11/04/2018   ALKPHOS 80 11/10/2016   AST 20 11/04/2018   ALT 13 11/04/2018   PROT 7.1 11/04/2018   ALBUMIN 4.3 11/10/2016   CALCIUM 9.3 11/04/2018   Lab Results  Component Value Date   CHOL 211 (H) 11/04/2018   Lab Results  Component Value Date   HDL 47 (L) 11/04/2018   Lab Results  Component Value Date   LDLCALC 139 (H) 11/04/2018   Lab Results  Component Value Date   TRIG 130 11/04/2018   Lab Results  Component Value Date   CHOLHDL 4.5 11/04/2018   No results found for: HGBA1C     Assessment & Plan:   Problem List Items Addressed This Visit    None    Visit Diagnoses    Right hip pain    -  Primary   Relevant Medications   ketorolac (TORADOL) injection 60 mg (Completed)   Other Relevant Orders   DG Hip Unilat W OR W/O Pelvis 2-3 Views Right   CBC w/Diff   RLQ abdominal pain       Relevant Orders   DG Hip Unilat W OR W/O Pelvis 2-3 Views Right   CBC w/Diff     Right lower quadrant pain-she is already had a history of appendectomy and hysterectomy.  It sounds like she had been having fewer stools but now that she has taken mag citrate and had a good bowel movement she still experiencing significant pain which is mostly with movement and with trying to lie down at night.  Abdominal exam  is benign she is really not significantly tender.  No palpable mass etc.  I think this is probably really coming from her hip.  Right hip pain-she had significant pain just with hip flexion without resistance.  And also with internal rotation of the hip.  She actually just had x-ray films earlier this year because of hip pain but this is more severe.  Consider avascular necrosis on the differential.  For now we will start with plain film x-ray and get a CBC.  Also given Toradol injection for acute pain relief.  She already has tramadol at home which she has been using and that does help some.  Meds ordered this encounter  Medications  . ketorolac (TORADOL) injection 60 mg     Beatrice Lecher, MD

## 2019-09-24 ENCOUNTER — Other Ambulatory Visit: Payer: Self-pay | Admitting: *Deleted

## 2019-09-24 LAB — CBC WITH DIFFERENTIAL/PLATELET
Absolute Monocytes: 600 cells/uL (ref 200–950)
Basophils Absolute: 48 cells/uL (ref 0–200)
Basophils Relative: 0.4 %
Eosinophils Absolute: 12 cells/uL — ABNORMAL LOW (ref 15–500)
Eosinophils Relative: 0.1 %
HCT: 41.5 % (ref 35.0–45.0)
Hemoglobin: 14 g/dL (ref 11.7–15.5)
Lymphs Abs: 1224 cells/uL (ref 850–3900)
MCH: 31.8 pg (ref 27.0–33.0)
MCHC: 33.7 g/dL (ref 32.0–36.0)
MCV: 94.3 fL (ref 80.0–100.0)
MPV: 9.8 fL (ref 7.5–12.5)
Monocytes Relative: 5 %
Neutro Abs: 10116 cells/uL — ABNORMAL HIGH (ref 1500–7800)
Neutrophils Relative %: 84.3 %
Platelets: 413 10*3/uL — ABNORMAL HIGH (ref 140–400)
RBC: 4.4 10*6/uL (ref 3.80–5.10)
RDW: 13.3 % (ref 11.0–15.0)
Total Lymphocyte: 10.2 %
WBC: 12 10*3/uL — ABNORMAL HIGH (ref 3.8–10.8)

## 2019-09-24 NOTE — Addendum Note (Signed)
Addended by: Beatrice Lecher D on: 09/24/2019 12:35 PM   Modules accepted: Orders

## 2019-09-25 ENCOUNTER — Other Ambulatory Visit: Payer: Self-pay | Admitting: Family Medicine

## 2019-09-25 ENCOUNTER — Other Ambulatory Visit: Payer: Self-pay

## 2019-09-25 ENCOUNTER — Ambulatory Visit (INDEPENDENT_AMBULATORY_CARE_PROVIDER_SITE_OTHER): Payer: Medicare Other

## 2019-09-25 DIAGNOSIS — R1031 Right lower quadrant pain: Secondary | ICD-10-CM | POA: Diagnosis not present

## 2019-09-25 DIAGNOSIS — M25551 Pain in right hip: Secondary | ICD-10-CM | POA: Diagnosis not present

## 2019-09-25 DIAGNOSIS — M1990 Unspecified osteoarthritis, unspecified site: Secondary | ICD-10-CM

## 2019-09-25 DIAGNOSIS — R9389 Abnormal findings on diagnostic imaging of other specified body structures: Secondary | ICD-10-CM

## 2019-09-26 ENCOUNTER — Other Ambulatory Visit: Payer: Self-pay | Admitting: Family Medicine

## 2019-09-28 LAB — SEDIMENTATION RATE: Sed Rate: 22 mm/h (ref 0–30)

## 2019-09-28 LAB — ANTI-NUCLEAR AB-TITER (ANA TITER): ANA Titer 1: 1:40 {titer} — ABNORMAL HIGH

## 2019-09-28 LAB — ANA: Anti Nuclear Antibody (ANA): POSITIVE — AB

## 2019-09-28 LAB — RHEUMATOID FACTOR: Rheumatoid fact SerPl-aCnc: <14 IU/mL (ref <14)

## 2019-10-08 ENCOUNTER — Ambulatory Visit (INDEPENDENT_AMBULATORY_CARE_PROVIDER_SITE_OTHER): Payer: Medicare Other | Admitting: Sports Medicine

## 2019-10-08 ENCOUNTER — Encounter: Payer: Self-pay | Admitting: Sports Medicine

## 2019-10-08 ENCOUNTER — Other Ambulatory Visit: Payer: Self-pay

## 2019-10-08 DIAGNOSIS — M160 Bilateral primary osteoarthritis of hip: Secondary | ICD-10-CM | POA: Insufficient documentation

## 2019-10-08 DIAGNOSIS — M1611 Unilateral primary osteoarthritis, right hip: Secondary | ICD-10-CM | POA: Diagnosis not present

## 2019-10-08 MED ORDER — CELECOXIB 200 MG PO CAPS: ORAL_CAPSULE | ORAL | 2 refills | Status: DC

## 2019-10-08 NOTE — Progress Notes (Signed)
Subjective:    CC: Hip pain  HPI: This is a pleasant 78 year old female with multifactorial back and hip pain, more recently she is had pain in her right groin, moderate, persistent, localized without radiation, x-rays confirmed osteoarthritis, she also has pain in her left lateral hip, moderate, persistent, difficult to sleep on the left side.  She is taking high-dose tramadol, baclofen, but she is not taking any NSAIDs.    She recently had bilateral sacroiliac joint steroid injections.  I reviewed the past medical history, family history, social history, surgical history, and allergies today and no changes were needed.  Please see the problem list section below in epic for further details.  Past Medical History: Past Medical History:  Diagnosis Date  . Chronic LBP    Lumbar DDD  . Colitis, ischemic (HCC)   . Depression   . Endometriosis   . GERD (gastroesophageal reflux disease)   . Hematuria   . Hyperlipidemia   . IBS (irritable bowel syndrome)    Fr Hurrelbrink- on Miralax and Citrucel daily  . Migraines   . MVP (mitral valve prolapse)    Past Surgical History: Past Surgical History:  Procedure Laterality Date  . ABDOMINAL HYSTERECTOMY  1991   w/ bilat oophorectomy for endometriosis  . APPENDECTOMY    . BREAST BIOPSY    . ESOPHAGOGASTRODUODENOSCOPY  06  . fused 5th digit right hand Right   . HEMILAMINOTOMY LUMBAR SPINE  11/18/1991   Dr. Lorain Childes  . lapartomy  1995  . OTHER SURGICAL HISTORY  1992, 1994  . right breast biopsy- benign    . SPINAL FUSION  08/18/97   Dr. Dorita Fray at [facility], L4-5 post fusion with iliac crest bone graft  . TONSILLECTOMY AND ADENOIDECTOMY     Social History: Social History   Socioeconomic History  . Marital status: Married    Spouse name: [name]  . Number of children: 6  . Years of education: 23  . Highest education level: 12th grade  Occupational History  . Occupation: retired    Comment: Print production planner for city of [employer]  Tobacco Use  . Smoking status: Former Smoker    Types: Cigarettes    Quit date: 10/23/1980    Years since quitting: 38.9  . Smokeless tobacco: Never Used  Substance and Sexual Activity  . Alcohol use: No  . Drug use: No  . Sexual activity: Not Currently  Other Topics Concern  . Not on file  Social History Narrative   Takes care of husband full time after he had a stroke last year. Stressed out due to this. Home health is in for right now.   Social Determinants of Health   Financial Resource Strain: Low Risk   . Difficulty of Paying Living Expenses: Not hard at all  Food Insecurity: No Food Insecurity  . Worried About Programme researcher, broadcasting/film/video in the Last Year: Never true  . Ran Out of Food in the Last Year: Never true  Transportation Needs: No Transportation Needs  . Lack of Transportation (Medical): No  . Lack of Transportation (Non-Medical): No  Physical Activity: Inactive  . Days of Exercise per Week: 0 days  . Minutes of Exercise per Session: 0 min  Stress: Stress Concern Present  . Feeling of Stress : Very much  Social Connections: Slightly Isolated  . Frequency of Communication with Friends and Family: More than three times a week  . Frequency of Social Gatherings with Friends and Family: Never  .  Attends Religious Services: More than 4 times per year  . Active Member of Clubs or Organizations: No  . Attends Archivist Meetings: Never  . Marital Status: Married   Family History: Family History  Problem Relation Age of Onset  . Alzheimer's disease Mother   . Other Father        ACS? - 41's  . Ataxia Sister    Allergies: Allergies  Allergen Reactions  . Buspar [Buspirone] Other (See Comments)    Dizziness   . Cymbalta [Duloxetine Hcl] Diarrhea  . Morphine    Medications: See med rec.  Review of Systems: No fevers, chills, night sweats, weight loss, chest pain, or shortness of breath.   Objective:    General: Well Developed, well nourished, and  in no acute distress.  Neuro: Alert and oriented x3, extra-ocular muscles intact, sensation grossly intact.  HEENT: Normocephalic, atraumatic, pupils equal round reactive to light, neck supple, no masses, no lymphadenopathy, thyroid nonpalpable.  Skin: Warm and dry, no rashes. Cardiac: Regular rate and rhythm, no murmurs rubs or gallops, no lower extremity edema.  Respiratory: Clear to auscultation bilaterally. Not using accessory muscles, speaking in full sentences. Bilateral hips: ROM IR: 45 degrees, no pain reproducible, ER: 60 Deg, Flexion: 120 Deg, Extension: 100 Deg, Abduction: 45 Deg, Adduction: 45 Deg Strength IR: 5/5, ER: 5/5, Flexion: 5/5, Extension: 5/5, Abduction: 5/5, Adduction: 5/5 Pelvic alignment unremarkable to inspection and palpation. Standing hip rotation and gait without trendelenburg / unsteadiness. Left greater trochanter tender on the left side. No tenderness over piriformis. No SI joint tenderness and normal minimal SI movement.  Right hip osteoarthritis visible on x-rays, personally reviewed.  Impression and Recommendations:    Primary osteoarthritis of right hip Pain in the right hip, exam is fairly benign, x-rays do confirm hip osteoarthritis. Adding Celebrex before considering hip joint injection. She did have some left lateral hip pain consistent with trochanteric bursitis, adding some rehab for this as well before we consider an injection here.    ___________________________________________ Gwen Her. Dianah Field, M.D., ABFM., CAQSM. Primary Care and Sports Medicine [facility]  Adjunct Professor of [employer]

## 2019-10-08 NOTE — Assessment & Plan Note (Signed)
Pain in the right hip, exam is fairly benign, x-rays do confirm hip osteoarthritis. Adding Celebrex before considering hip joint injection. She did have some left lateral hip pain consistent with trochanteric bursitis, adding some rehab for this as well before we consider an injection here.

## 2019-10-10 ENCOUNTER — Other Ambulatory Visit: Payer: Self-pay | Admitting: Family Medicine

## 2019-10-19 ENCOUNTER — Other Ambulatory Visit: Payer: Self-pay | Admitting: Family Medicine

## 2019-10-29 IMAGING — MG DIGITAL DIAGNOSTIC UNILATERAL LEFT MAMMOGRAM WITH TOMO AND CAD
4 series · 4 of 12 positions shown · non-contrast
Comparison: Previous exam(s).

CLINICAL DATA: Screening recall for a possible asymmetry in the
left breast.

EXAM:
DIGITAL DIAGNOSTIC LEFT MAMMOGRAM WITH CAD AND TOMO
ULTRASOUND LEFT BREAST

[L MLO synth-2D]
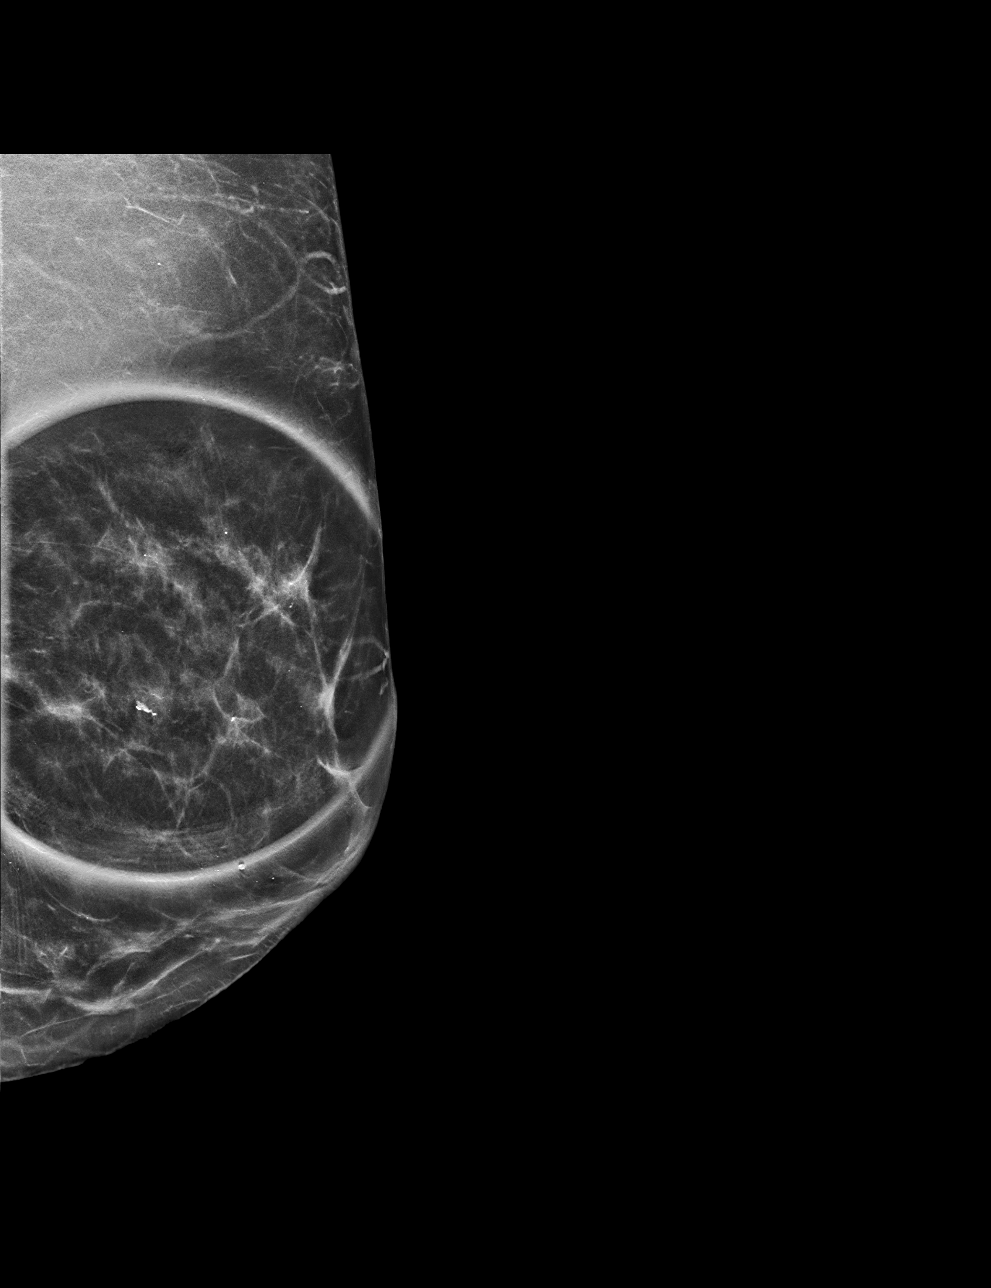

[L CC synth-2D]
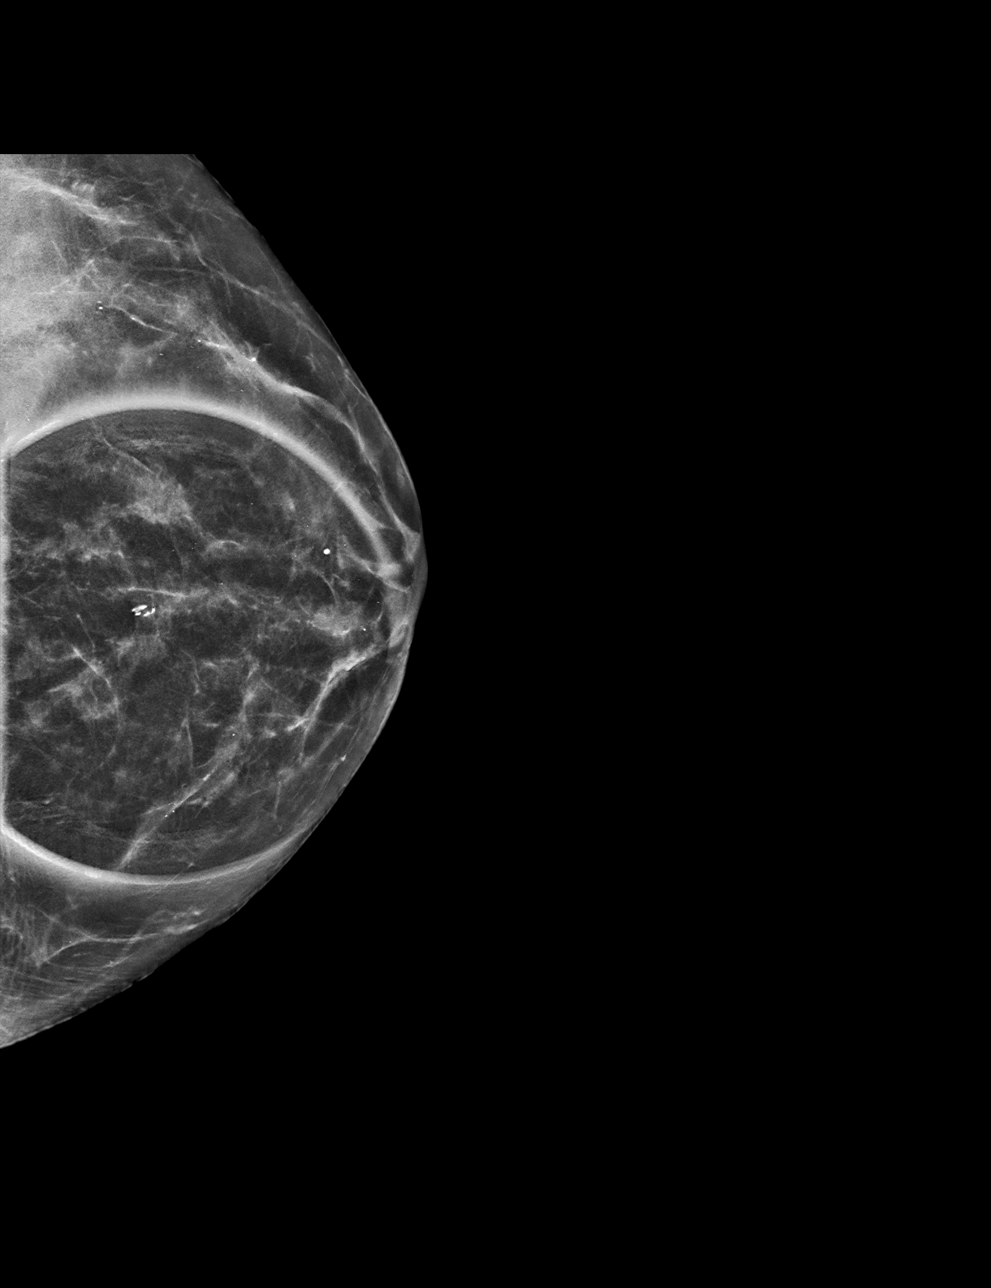

[L MLO tomo · tomo slice 29/58.0]
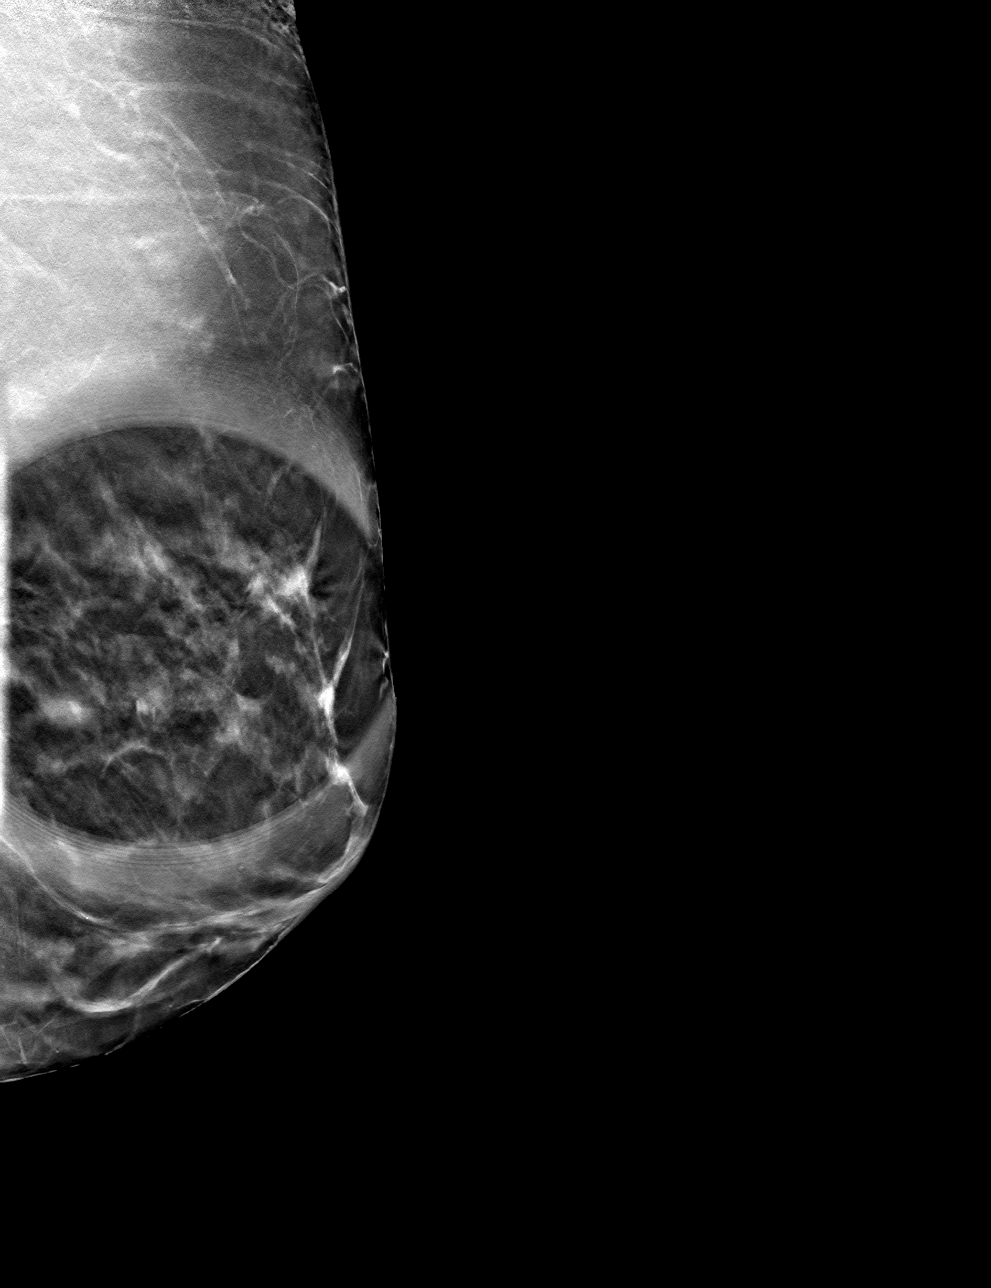

[L CC tomo · tomo slice 25/50.0]
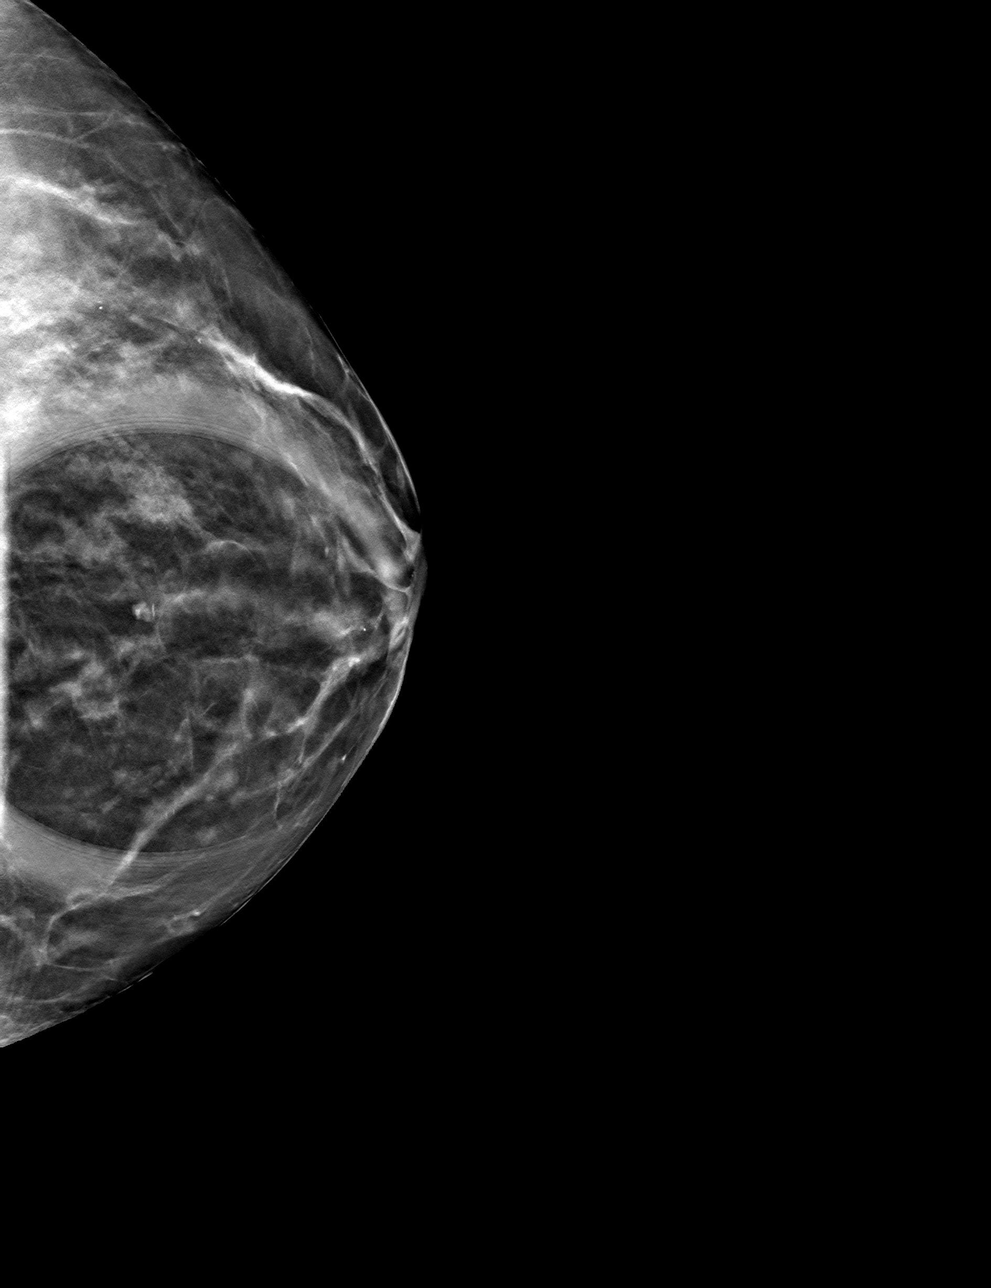

[4 of 12 positions shown; findings below may reference images not displayed]

ACR Breast Density Category b: There are scattered areas of
fibroglandular density.
FINDINGS: Possible asymmetry noted on the current screening study disperses
consistent with superimposed fibroglandular tissue. There is no
underlying mass or significant residual asymmetry. There are no
areas of architectural distortion and there are no new or suspicious
calcifications.

Mammographic images were processed with CAD.

On physical exam, no mass is palpated in the retroareolar or upper
inner periareolar left breast.

Targeted ultrasound is performed, showing normal fibroglandular
tissue throughout the retroareolar and periareolar upper inner
quadrant of the left breast. No mass or suspicious lesion.
IMPRESSION: Negative exam.  No evidence of breast malignancy

RECOMMENDATION:
Screening mammogram in one year.(Code:UJ-[id number])

I have discussed the findings and recommendations with the patient.
Results were also provided in writing at the conclusion of the
visit. If applicable, a reminder letter will be sent to the patient
regarding the next appointment.

BI-RADS CATEGORY  1: Negative.

## 2019-11-05 ENCOUNTER — Ambulatory Visit (INDEPENDENT_AMBULATORY_CARE_PROVIDER_SITE_OTHER): Payer: Medicare Other | Admitting: Sports Medicine

## 2019-11-05 ENCOUNTER — Other Ambulatory Visit: Payer: Self-pay

## 2019-11-05 DIAGNOSIS — M160 Bilateral primary osteoarthritis of hip: Secondary | ICD-10-CM | POA: Diagnosis not present

## 2019-11-05 DIAGNOSIS — M4727 Other spondylosis with radiculopathy, lumbosacral region: Secondary | ICD-10-CM | POA: Diagnosis not present

## 2019-11-05 NOTE — Progress Notes (Signed)
    Procedures performed today:    Procedure: Real-time Ultrasound Guided injection of the left hip joint Device: Samsung HS60  Verbal informed consent obtained.  Time-out conducted.  Noted no overlying erythema, induration, or other signs of local infection.  Skin prepped in a sterile fashion.  Local anesthesia: Topical Ethyl chloride.  With sterile technique and under real time ultrasound guidance: 1 cc Kenalog 40, 2 cc lidocaine, 2 cc bupivacaine injected easily Completed without difficulty  Pain immediately resolved suggesting accurate placement of the medication.  Advised to call if fevers/chills, erythema, induration, drainage, or persistent bleeding.  Images permanently stored and available for review in the ultrasound unit.  Impression: Technically successful ultrasound guided injection.  Independent interpretation of tests performed by another provider:   I personally reviewed her x-rays and CT of the pelvis, she has significant bilateral hip osteoarthritis, worse on the left side with a large subchondral cyst, this can be seen as a result of osteoarthritis as the 4 cardinal signs of osteoarthritis on x-rays are joint space narrowing, subchondral sclerosis, subchondral cystic changes, and osteophytosis.  She also has a lower lumbar fusion with an implanted spinal cord stimulator.  These appear unremarkable.  Impression and Recommendations:    Primary osteoarthritis of both hips Carolyn Hendrix returns, initially she was complaining of right hip pain, we added Celebrex and she is no longer complaining of pain in her right hip, she does have severe pain in the left hip, x-rays and a CT did show significant subchondral cystic changes which can be seen in severe osteoarthritis. She has significant pain with internal rotation of the left hip. I do not think an MRI can be performed with her spinal cord stimulator. Today we performed a left hip joint injection, return to see me in 1  month.  Lumbosacral spondylosis Lumbar fusion, spinal cord stimulator. If the hip joint injection does not provide significant relief I do think she will need a hip replacement. With regards to her back I would recommend further management per her pain provider.    ___________________________________________ Ihor Austin. Benjamin Stain, M.D., ABFM., CAQSM. Primary Care and Sports Medicine [facility]  Adjunct Instructor of Family Medicine  [employer]

## 2019-11-05 NOTE — Assessment & Plan Note (Signed)
Lumbar fusion, spinal cord stimulator. If the hip joint injection does not provide significant relief I do think she will need a hip replacement. With regards to her back I would recommend further management per her pain provider.

## 2019-11-05 NOTE — Assessment & Plan Note (Signed)
Carolyn Hendrix returns, initially she was complaining of right hip pain, we added Celebrex and she is no longer complaining of pain in her right hip, she does have severe pain in the left hip, x-rays and a CT did show significant subchondral cystic changes which can be seen in severe osteoarthritis. She has significant pain with internal rotation of the left hip. I do not think an MRI can be performed with her spinal cord stimulator. Today we performed a left hip joint injection, return to see me in 1 month.

## 2019-11-17 ENCOUNTER — Other Ambulatory Visit: Payer: Self-pay | Admitting: Family Medicine

## 2019-11-18 ENCOUNTER — Other Ambulatory Visit: Payer: Self-pay | Admitting: Family Medicine

## 2019-11-19 ENCOUNTER — Encounter: Payer: Self-pay | Admitting: Family Medicine

## 2019-11-20 ENCOUNTER — Telehealth (INDEPENDENT_AMBULATORY_CARE_PROVIDER_SITE_OTHER): Payer: Medicare Other | Admitting: Family Medicine

## 2019-11-20 ENCOUNTER — Encounter: Payer: Self-pay | Admitting: Family Medicine

## 2019-11-20 VITALS — BP 138/91 | HR 88 | Ht 66.0 in | Wt 148.0 lb

## 2019-11-20 DIAGNOSIS — T148XXA Other injury of unspecified body region, initial encounter: Secondary | ICD-10-CM

## 2019-11-20 DIAGNOSIS — X58XXXA Exposure to other specified factors, initial encounter: Secondary | ICD-10-CM

## 2019-11-20 MED ORDER — SULFAMETHOXAZOLE-TRIMETHOPRIM 800-160 MG PO TABS: 1.0000 | ORAL_TABLET | Freq: Two times a day (BID) | ORAL | 0 refills | Status: DC

## 2019-11-20 NOTE — Progress Notes (Signed)
Pt was seen by Dr.T on 11/05/2019 for a L hip injection. She did well with the injection but noticed that her skin began to turn blue opposite of where the injection was given and a few days later she developed a wound at her panty line. She stated that it began to ooze so she was using bandages but because of the area it was difficult to keep the area dry. She switched over to using surgical dressing and stated that the area is beginning to dry out and form a scab.

## 2019-11-20 NOTE — Progress Notes (Addendum)
Virtual Visit via Video Note  I connected with Carolyn Hendrix on 11/20/19 at 10:10 AM EST by a video enabled telemedicine application and verified that I am speaking with the correct person using two identifiers.   I discussed the limitations of evaluation and management by telemedicine and the availability of in person appointments. The patient expressed understanding and agreed to proceed.  Subjective:    CC: Wounds, new  HPI: Pt was seen by Dr.T on 11/05/2019 for a L hip injection. She noticed a discoloration and an indention about the the size of a quarter on the outside of her hip.  She did well with the injection,but noticed that her skin began to turn blue opposite of where the injection was given and a few days later she developed a wound at her panty line. She stated that it began to ooze so she was using bandages but because of the area it was difficult to keep the area dry. She switched over to using surgical dressing and stated that the area is beginning to dry out and form a scab. No fever, chills. Some bloody drainage. It has formed a scab now.  See MyChart Photos.    Past medical history, Surgical history, Family history not pertinant except as noted below, Social history, Allergies, and medications have been entered into the medical record, reviewed, and corrections made.   Review of Systems: No fevers, chills, night sweats, weight loss, chest pain, or shortness of breath.   Objective:    General: Speaking clearly in complete sentences without any shortness of breath.  Alert and oriented x3.  Normal judgment. No apparent acute distress. See photos     Impression and Recommendations:    Wound, new diagnosis-unclear etiology.  It did start after she had a steroid injection into the hip but not at the location where the needle when it is actually lateral to the hip which is a little bit unusual so unclear if it could be related or not.  But at this point in time recommend  application of Vaseline with cotton gauze on top to allow it to breathe a little bit.  Do not seal over the wound.  Okay to wash in the shower with antibacterial soap just with fingertips.  Do not scrub at it.  Apply Vaseline after patting dry.  Recommended avoiding using any type of alcohol or peroxide products.  Artelia Laroche try this through the weekend if she is unable to get a ride here today.  I would like for her to try to send me another photograph on my chart on Sunday or Monday so that we can make sure that it looks like it is healing and discussed with her that if not then at that point I will need her to come in so that we can do a little bit better wound care.  She is to call us back if she develops any new symptoms, increased drainage, odor or fevers chills or sweats or increased redness or streaking erythema around the wound.  We will also go ahead and call in Bactrim as it does appear in the photograph that there is a little bit of erythema extending from the wound.  So unclear if some early cellulitis or not.  We will err on the side of caution.   Time spent 25 min in encounter.   I discussed the assessment and treatment plan with the patient. The patient was provided an opportunity to ask questions and all were answered. The  patient agreed with the plan and demonstrated an understanding of the instructions.   The patient was advised to call back or seek an in-person evaluation if the symptoms worsen or if the condition fails to improve as anticipated.   Beatrice Lecher, MD

## 2019-11-20 NOTE — Telephone Encounter (Signed)
Patient has an appointment on 11/20/2019.

## 2019-11-21 NOTE — Progress Notes (Signed)
Subjective:   Carolyn Hendrix is a 79 y.o. female who presents for Medicare Annual (Subsequent) preventive examination.  Review of Systems:  No ROS.  Medicare Wellness Virtual Visit.  Visual/audio telehealth visit, UTA vital signs.   See social history for additional risk factors.    Cardiac Risk Factors include: advanced age (>80men, >24 women) Sleep patterns:Getting on average 6-8 hours a night. Wakes up 1 time a night to void. Wakes up and feels rested and ready for the day.    Home Safety/Smoke Alarms: Feels safe in home. Smoke alarms in place.  Living environment; Lives with husband and takes care of him in a 1 story home. No stairs in or around the home. Shower is a step over tub combo and grab bars in place. Seat Belt Safety/Bike Helmet: Wears seat belt.   Female:   Pap- Aged out      Mammo- UTD      Dexa scan- UTD       CCS- Aged out     Objective:     Vitals: BP 128/69   Pulse 83   Ht 5\' 6"  (1.676 m)   Wt 151 lb (68.5 kg)   SpO2 94%   BMI 24.37 kg/m   Body mass index is 24.37 kg/m.  Advanced Directives 11/25/2019 11/19/2018 05/23/2017 09/11/2016 09/23/2014  Does Patient Have a Medical Advance Directive? Yes Yes Yes Yes Yes  Type of 14/11/2013 of [place];Living will Healthcare Power of [place];Living will Healthcare Power of [place];Living will Healthcare Power of [place];Living will Living will  Does patient want to make changes to medical advance directive? No - Patient declined No - Patient declined - No - Patient declined -  Copy of Healthcare Power of Attorney in Chart? No - copy requested No - copy requested Yes Yes No - copy requested    Tobacco Social History   Tobacco Use  Smoking Status Former Smoker  . Types: Cigarettes  . Quit date: 10/23/1980  . Years since quitting: 39.1  Smokeless Tobacco Never Used     Counseling given: No   Clinical Intake:  Pre-visit preparation completed: Yes  Pain : No/denies pain      Nutritional Risks: None Diabetes: No  How often do you need to have someone help you when you read instructions, pamphlets, or other written materials from your doctor or pharmacy?: 1 - Never What is the last grade level you completed in school?: 12  Interpreter Needed?: No  Information entered by :: [email or web address], LPN  Past Medical History:  Diagnosis Date  . Chronic LBP    Lumbar DDD  . Colitis, ischemic (HCC)   . Depression   . Endometriosis   . GERD (gastroesophageal reflux disease)   . Hematuria   . Hyperlipidemia   . IBS (irritable bowel syndrome)    Fr Hurrelbrink- on Miralax and Citrucel daily  . Migraines   . MVP (mitral valve prolapse)    Past Surgical History:  Procedure Laterality Date  . ABDOMINAL HYSTERECTOMY  1991   w/ bilat oophorectomy for endometriosis  . APPENDECTOMY    . BREAST BIOPSY    . ESOPHAGOGASTRODUODENOSCOPY  06  . fused 5th digit right hand Right   . HEMILAMINOTOMY LUMBAR SPINE  11/18/1991   Dr. 11/20/1991  . lapartomy  1995  . OTHER SURGICAL HISTORY  1992, 1994  . right breast biopsy- benign    . SPINAL FUSION  08/18/97   Dr. [facility]/20/97 at [facility], L4-5  post fusion with iliac crest bone graft  . TONSILLECTOMY AND ADENOIDECTOMY     Family History  Problem Relation Age of Onset  . Alzheimer's disease Mother   . Other Father        ACS? - 74's  . Ataxia Sister    Social History   Socioeconomic History  . Marital status: Married    Spouse name: [name]  . Number of children: 6  . Years of education: 26  . Highest education level: 12th grade  Occupational History  . Occupation: retired    Comment: Print production planner for city of [employer]  Tobacco Use  . Smoking status: Former Smoker    Types: Cigarettes    Quit date: 10/23/1980    Years since quitting: 39.1  . Smokeless tobacco: Never Used  Substance and Sexual Activity  . Alcohol use: No  . Drug use: No  . Sexual activity: Not Currently  Other Topics Concern  . Not on file   Social History Narrative   Takes care of husband full time after he had a stroke last year. Stressed out due to this. Home health is in for right now. Husband condition is worsening   Social Determinants of Corporate investment banker Strain:   . Difficulty of Paying Living Expenses: Not on file  Food Insecurity:   . Worried About Programme researcher, broadcasting/film/video in the Last Year: Not on file  . Ran Out of Food in the Last Year: Not on file  Transportation Needs:   . Lack of Transportation (Medical): Not on file  . Lack of Transportation (Non-Medical): Not on file  Physical Activity:   . Days of Exercise per Week: Not on file  . Minutes of Exercise per Session: Not on file  Stress:   . Feeling of Stress : Not on file  Social Connections:   . Frequency of Communication with Friends and Family: Not on file  . Frequency of Social Gatherings with Friends and Family: Not on file  . Attends Religious Services: Not on file  . Active Member of Clubs or Organizations: Not on file  . Attends Banker Meetings: Not on file  . Marital Status: Not on file    Outpatient Encounter Medications as of 11/25/2019  Medication Sig  . baclofen (LIORESAL) 10 MG tablet TAKE 1 TABLET BY MOUTH AT BEDTIME AS NEEDED FOR MUSCLE SPASMS  . buPROPion (WELLBUTRIN XL) 150 MG 24 hr tablet TAKE 1 TABLET BY MOUTH EVERY DAY IN THE MORNING  . dicyclomine (BENTYL) 10 MG capsule TAKE ONE CAPSULE 3 TIMES A DAY AS NEEDED SPASMS  . escitalopram (LEXAPRO) 20 MG tablet TAKE 1 TABLET BY MOUTH EVERY DAY  . glucosamine-chondroitin 500-400 MG tablet Take 1 tablet by mouth 2 (two) times daily.  . naloxone (NARCAN) nasal spray 4 mg/0.1 mL Place into the nose.  Marland Kitchen omeprazole (PRILOSEC) 20 MG capsule TAKE ONE CAPSULE EVERY DAY  . pravastatin (PRAVACHOL) 40 MG tablet Take 1 tablet (40 mg total) by mouth daily.  . pseudoephedrine (SUDAFED) 60 MG tablet Take 60 mg by mouth every 4 (four) hours as needed for congestion.  .  sulfamethoxazole-trimethoprim (BACTRIM DS) 800-160 MG tablet Take 1 tablet by mouth 2 (two) times daily.  . traMADol (ULTRAM) 50 MG tablet TAKE 1 TO 2 TABLETS BY MOUTH 3 TIMES A DAY AS NEEDED FOR PAIN  . traZODone (DESYREL) 100 MG tablet TAKE 1 TABLET BY MOUTH EVERYDAY AT BEDTIME  . TURMERIC PO Take by  mouth daily.   No facility-administered encounter medications on file as of 11/25/2019.    Activities of Daily Living In your present state of health, do you have any difficulty performing the following activities: 11/25/2019  Hearing? N  Vision? N  Difficulty concentrating or making decisions? Y  Comment sometimes with making decisions  Walking or climbing stairs? N  Dressing or bathing? N  Doing errands, shopping? N  Preparing Food and eating ? N  Using the Toilet? N  In the past six months, have you accidently leaked urine? N  Do you have problems with loss of bowel control? N  Managing your Medications? N  Managing your Finances? N  Housekeeping or managing your Housekeeping? N  Some recent data might be hidden    Patient Care Team: Hali Marry, MD as PCP - General (Family Medicine) Linward Natal, MD as Referring Physician (Obstetrics and Gynecology) Cleon Gustin, MD as Consulting Physician (Anesthesiology) Vernie Ammons, MD as Referring Physician (Dermatology)    Assessment:   This is a routine wellness examination for [place].Physical assessment deferred to PCP.    Exercise Activities and Dietary recommendations Current Exercise Habits: The patient does not participate in regular exercise at present, Exercise limited by: None identified Diet Eats a healthy diet of fruits, proteins and vegetables. Does admit to stress eating at night. Breakfast: Fratatas or cereal or yogurt Lunch: skip Dinner: Meat and vegetables    Drinks 40 ounces of water daily  Goals    . Exercise 3x per week (30 min per time)     Try and walk 3 times a week for at least 30  minutes at a time. This will relieve you stress level as well.       Fall Risk Fall Risk  11/25/2019 06/13/2019 11/19/2018 08/13/2018 05/15/2018  Falls in the past year? 0 0 0 No No  Number falls in past yr: - 0 - - -  Injury with Fall? - 0 - - -  Risk for fall due to : No Fall Risks - Other (Comment) - -  Risk for fall due to: Comment - - taking care of husband at home who had a stroke - -  Follow up Falls prevention discussed - Falls prevention discussed - -   Is the patient's home free of loose throw rugs in walkways, pet beds, electrical cords, etc?   yes      Grab bars in the bathroom? yes      Handrails on the stairs?   no      Adequate lighting?   yes   Depression Screen PHQ 2/9 Scores 11/25/2019 06/13/2019 03/13/2019 12/11/2018  PHQ - 2 Score 1 3 6 5   PHQ- 9 Score - 6 15 19   Exception Documentation Medical reason - - -     Cognitive Function MMSE - Mini Mental State Exam 09/24/2014  Orientation to time 0  Orientation to Place 0  Registration 0  Attention/ Calculation 0  Recall 0  Language- name 2 objects 0  Language- repeat 0  Language- follow 3 step command 0  Language- read & follow direction 0  Write a sentence 0  Copy design 0  Total score 0     6CIT Screen 11/25/2019 11/19/2018 11/10/2016  What Year? 0 points 0 points 0 points  What month? 0 points 0 points 0 points  What time? 0 points 0 points 0 points  Count back from 20 0 points 0 points 0 points  Months in reverse 0  points 0 points 0 points  Repeat phrase 0 points 0 points 0 points  Total Score 0 0 0    Immunization History  Administered Date(s) Administered  . Fluad Quad(high Dose 65+) 06/13/2019  . Influenza Split 08/30/2011, 08/07/2012  . Influenza Whole 08/19/2008, 08/03/2010  . Influenza, Seasonal, Injecte, Preservative Fre 07/22/2013  . Influenza,inj,Quad PF,6+ Mos 09/23/2014, 06/25/2015, 09/08/2016  . Influenza-Unspecified 06/27/2017, 08/11/2018  . Pneumococcal Conjugate-13 09/23/2014  .  Pneumococcal Polysaccharide-23 06/02/2009  . Td 05/16/2006  . Tdap 12/18/2016  . Zoster 05/30/2011  . Zoster Recombinat (Shingrix) 11/22/2018, 05/14/2019    Screening Tests Health Maintenance  Topic Date Due  . TETANUS/TDAP  12/18/2026  . INFLUENZA VACCINE  Completed  . DEXA SCAN  Completed  . PNA vac Low Risk Adult  Completed        Plan:    Please schedule your next medicare wellness visit with me in 1 yr.  Ms. Shrestha , Thank you for taking time to come for your Medicare Wellness Visit. I appreciate your ongoing commitment to your health goals. Please review the following plan we discussed and let me know if I can assist you in the future.  Continue doing brain stimulating activities (puzzles, reading, adult coloring books, staying active) to keep memory sharp.  Bring a copy of your living will and/or healthcare power of attorney to your next office visit.   These are the goals we discussed: Goals    . Exercise 3x per week (30 min per time)     Try and walk 3 times a week for at least 30 minutes at a time. This will relieve you stress level as well.       This is a list of the screening recommended for you and due dates:  Health Maintenance  Topic Date Due  . Tetanus Vaccine  12/18/2026  . Flu Shot  Completed  . DEXA scan (bone density measurement)  Completed  . Pneumonia vaccines  Completed      I have personally reviewed and noted the following in the patient's chart:   . Medical and social history . Use of alcohol, tobacco or illicit drugs  . Current medications and supplements . Functional ability and status . Nutritional status . Physical activity . Advanced directives . List of other physicians . Hospitalizations, surgeries, and ER visits in previous 12 months . Vitals . Screenings to include cognitive, depression, and falls . Referrals and appointments  In addition, I have reviewed and discussed with patient certain preventive protocols, quality  metrics, and best practice recommendations. A written personalized care plan for preventive services as well as general preventive health recommendations were provided to patient.     Normand Sloop, LPN  11/23/3084

## 2019-11-23 ENCOUNTER — Encounter: Payer: Self-pay | Admitting: Family Medicine

## 2019-11-24 ENCOUNTER — Ambulatory Visit: Payer: Medicare Other

## 2019-11-25 ENCOUNTER — Ambulatory Visit (INDEPENDENT_AMBULATORY_CARE_PROVIDER_SITE_OTHER): Payer: Medicare Other | Admitting: *Deleted

## 2019-11-25 ENCOUNTER — Other Ambulatory Visit: Payer: Self-pay

## 2019-11-25 VITALS — BP 128/69 | HR 83 | Ht 66.0 in | Wt 151.0 lb

## 2019-11-25 DIAGNOSIS — Z0000 Encounter for general adult medical examination without abnormal findings: Secondary | ICD-10-CM

## 2019-11-25 NOTE — Patient Instructions (Addendum)
Please schedule your next medicare wellness visit with me in 1 yr.  Carolyn Hendrix , Thank you for taking time to come for your Medicare Wellness Visit. I appreciate your ongoing commitment to your health goals. Please review the following plan we discussed and let me know if I can assist you in the future.  Continue doing brain stimulating activities (puzzles, reading, adult coloring books, staying active) to keep memory sharp.  Bring a copy of your living will and/or healthcare power of attorney to your next office visit.  These are the goals we discussed: Goals    . Exercise 3x per week (30 min per time)     Try and walk 3 times a week for at least 30 minutes at a time. This will relieve you stress level as well.

## 2019-12-03 ENCOUNTER — Ambulatory Visit: Payer: Medicare Other | Admitting: Sports Medicine

## 2019-12-10 ENCOUNTER — Other Ambulatory Visit: Payer: Self-pay | Admitting: Family Medicine

## 2019-12-15 ENCOUNTER — Other Ambulatory Visit: Payer: Self-pay

## 2019-12-15 ENCOUNTER — Encounter: Payer: Self-pay | Admitting: Family Medicine

## 2019-12-15 ENCOUNTER — Ambulatory Visit (INDEPENDENT_AMBULATORY_CARE_PROVIDER_SITE_OTHER): Payer: Medicare Other | Admitting: Family Medicine

## 2019-12-15 VITALS — BP 129/59 | HR 105 | Ht 66.0 in | Wt 151.0 lb

## 2019-12-15 DIAGNOSIS — F339 Major depressive disorder, recurrent, unspecified: Secondary | ICD-10-CM

## 2019-12-15 DIAGNOSIS — F411 Generalized anxiety disorder: Secondary | ICD-10-CM | POA: Diagnosis not present

## 2019-12-15 DIAGNOSIS — N289 Disorder of kidney and ureter, unspecified: Secondary | ICD-10-CM

## 2019-12-15 DIAGNOSIS — Z636 Dependent relative needing care at home: Secondary | ICD-10-CM | POA: Insufficient documentation

## 2019-12-15 DIAGNOSIS — M7062 Trochanteric bursitis, left hip: Secondary | ICD-10-CM

## 2019-12-15 DIAGNOSIS — E7800 Pure hypercholesterolemia, unspecified: Secondary | ICD-10-CM | POA: Diagnosis not present

## 2019-12-15 NOTE — Assessment & Plan Note (Signed)
Overall depressive symptoms fairly well controlled still some anxiety symptoms.  Continue with current regimen of Lexapro 20 mg as well as Wellbutrin.  She is actually looking into getting a nighttime caretaker that will really help her be able to get a good night sleep which I think will overall help with her mood.

## 2019-12-15 NOTE — Assessment & Plan Note (Signed)
Due to recheck lipids.  Currently on pravastatin 40 mg daily.

## 2019-12-15 NOTE — Assessment & Plan Note (Signed)
She is actually looking into getting a nighttime caretaker that will really help her be able to get a good night sleep which I think will overall help with her mood.

## 2019-12-15 NOTE — Progress Notes (Signed)
Established Patient Office Visit  Subjective:  Patient ID: Carolyn Hendrix, female    DOB: [birth year]  Age: 79 y.o. MRN: [id number]  CC:  Chief Complaint  Patient presents with  . MOOD    HPI [name] presents for   6 months major depressive disorder follow-up-she is currently on Lexapro 20 mg and is taking trazodone 100 mg at bedtime for sleep as well as Wellbutrin 150 mg.  She reports still having some little interest or pleasure doing things several days a week as well as difficulty with sleep and energy more than half of the days.  She still reports feeling nervous and anxious several days a week and trouble relaxing.  No thoughts of wanting to harm herself.  HQ 9 score of 6 and GAD-7 score of 3.  She still the primary caregiver for her husband.  She said she was able to go spend the weekend with her cousin and said that she had the 2 best nights of sleep that she had a really long time.  He has a caretaker that helps her during the daytime but not at night and he frequently wakes her up calling out her name.  Unfortunately his condition has progressed.  She also complains of some pain on the outside of her hip.  She just wanted to let me know it seems to be a little anterior to the previous wound she received a steroid injection back in January.  She still has an indented and scabbed over area where she received the injection but feels like it is healing.  She says she could not sleep on the hip last night.   Past Medical History:  Diagnosis Date  . Chronic LBP    Lumbar DDD  . Colitis, ischemic (HCC)   . Depression   . Endometriosis   . GERD (gastroesophageal reflux disease)   . Hematuria   . Hyperlipidemia   . IBS (irritable bowel syndrome)    Fr Hurrelbrink- on Miralax and Citrucel daily  . Migraines   . MVP (mitral valve prolapse)     Past Surgical History:  Procedure Laterality Date  . ABDOMINAL HYSTERECTOMY  1991   w/ bilat oophorectomy for endometriosis  .  APPENDECTOMY    . BREAST BIOPSY    . ESOPHAGOGASTRODUODENOSCOPY  06  . fused 5th digit right hand Right   . HEMILAMINOTOMY LUMBAR SPINE  11/18/1991   Dr. Lorain Childes  . lapartomy  1995  . OTHER SURGICAL HISTORY  1992, 1994  . right breast biopsy- benign    . SPINAL FUSION  08/18/97   Dr. Dorita Fray at [facility], L4-5 post fusion with iliac crest bone graft  . TONSILLECTOMY AND ADENOIDECTOMY      Family History  Problem Relation Age of Onset  . Alzheimer's disease Mother   . Other Father        ACS? - 19's  . Ataxia Sister     Social History   Socioeconomic History  . Marital status: Married    Spouse name: [name]  . Number of children: 6  . Years of education: 59  . Highest education level: 12th grade  Occupational History  . Occupation: retired    Comment: Print production planner for city of [employer]  Tobacco Use  . Smoking status: Former Smoker    Types: Cigarettes    Quit date: 10/23/1980    Years since quitting: 39.1  . Smokeless tobacco: Never Used  Substance and Sexual Activity  .  Alcohol use: No  . Drug use: No  . Sexual activity: Not Currently  Other Topics Concern  . Not on file  Social History Narrative   Takes care of husband full time after he had a stroke last year. Stressed out due to this. Home health is in for right now. Husband condition is worsening   Social Determinants of Radio broadcast assistant Strain:   . Difficulty of Paying Living Expenses: Not on file  Food Insecurity:   . Worried About Charity fundraiser in the Last Year: Not on file  . Ran Out of Food in the Last Year: Not on file  Transportation Needs:   . Lack of Transportation (Medical): Not on file  . Lack of Transportation (Non-Medical): Not on file  Physical Activity:   . Days of Exercise per Week: Not on file  . Minutes of Exercise per Session: Not on file  Stress:   . Feeling of Stress : Not on file  Social Connections:   . Frequency of Communication with Friends and Family:  Not on file  . Frequency of Social Gatherings with Friends and Family: Not on file  . Attends Religious Services: Not on file  . Active Member of Clubs or Organizations: Not on file  . Attends Archivist Meetings: Not on file  . Marital Status: Not on file  Intimate Partner Violence:   . Fear of Current or Ex-Partner: Not on file  . Emotionally Abused: Not on file  . Physically Abused: Not on file  . Sexually Abused: Not on file    Outpatient Medications Prior to Visit  Medication Sig Dispense Refill  . baclofen (LIORESAL) 10 MG tablet Take 1 tablet (10 mg total) by mouth at bedtime as needed for muscle spasms. 30 tablet 0  . buPROPion (WELLBUTRIN XL) 150 MG 24 hr tablet TAKE 1 TABLET BY MOUTH EVERY DAY IN THE MORNING 90 tablet 0  . dicyclomine (BENTYL) 10 MG capsule TAKE ONE CAPSULE 3 TIMES A DAY AS NEEDED SPASMS 270 capsule 0  . escitalopram (LEXAPRO) 20 MG tablet TAKE 1 TABLET BY MOUTH EVERY DAY 90 tablet 1  . glucosamine-chondroitin 500-400 MG tablet Take 1 tablet by mouth 2 (two) times daily.    . naloxone (NARCAN) nasal spray 4 mg/0.1 mL Place into the nose.    Marland Kitchen omeprazole (PRILOSEC) 20 MG capsule TAKE ONE CAPSULE EVERY DAY 90 capsule 3  . pravastatin (PRAVACHOL) 40 MG tablet Take 1 tablet (40 mg total) by mouth daily. 90 tablet 3  . pseudoephedrine (SUDAFED) 60 MG tablet Take 60 mg by mouth every 4 (four) hours as needed for congestion.    . traMADol (ULTRAM) 50 MG tablet TAKE 1 TO 2 TABLETS BY MOUTH 3 TIMES A DAY AS NEEDED FOR PAIN  1  . traZODone (DESYREL) 100 MG tablet TAKE 1 TABLET BY MOUTH EVERYDAY AT BEDTIME 90 tablet 0  . TURMERIC PO Take by mouth daily.    Marland Kitchen sulfamethoxazole-trimethoprim (BACTRIM DS) 800-160 MG tablet Take 1 tablet by mouth 2 (two) times daily. 14 tablet 0   No facility-administered medications prior to visit.    Allergies  Allergen Reactions  . Buspar [Buspirone] Other (See Comments)    Dizziness   . Cymbalta [Duloxetine Hcl] Diarrhea   . Morphine     ROS Review of Systems    Objective:    Physical Exam  Constitutional: She is oriented to person, place, and time. She appears well-developed and well-nourished.  HENT:  Head: Normocephalic and atraumatic.  Cardiovascular: Normal rate, regular rhythm and normal heart sounds.  Pulmonary/Chest: Effort normal and breath sounds normal.  Musculoskeletal:     Comments: Tender over the left greater trochanter.  Neurological: She is alert and oriented to person, place, and time.  Skin: Skin is warm and dry.  Psychiatric: She has a normal mood and affect. Her behavior is normal.    BP (!) 129/59   Pulse (!) 105   Ht 5\' 6"  (1.676 m)   Wt 151 lb (68.5 kg)   SpO2 97%   BMI 24.37 kg/m  Wt Readings from Last 3 Encounters:  12/15/19 151 lb (68.5 kg)  11/25/19 151 lb (68.5 kg)  11/20/19 148 lb (67.1 kg)     There are no preventive care reminders to display for this patient.  There are no preventive care reminders to display for this patient.  Lab Results  Component Value Date   TSH 1.81 11/16/2017   Lab Results  Component Value Date   WBC 12.0 (H) 09/23/2019   HGB 14.0 09/23/2019   HCT 41.5 09/23/2019   MCV 94.3 09/23/2019   PLT 413 (H) 09/23/2019   Lab Results  Component Value Date   NA 140 11/04/2018   K 4.4 11/04/2018   CO2 34 (H) 11/04/2018   GLUCOSE 71 11/04/2018   BUN 20 11/04/2018   CREATININE 0.88 11/04/2018   BILITOT 0.4 11/04/2018   ALKPHOS 80 11/10/2016   AST 20 11/04/2018   ALT 13 11/04/2018   PROT 7.1 11/04/2018   ALBUMIN 4.3 11/10/2016   CALCIUM 9.3 11/04/2018   Lab Results  Component Value Date   CHOL 211 (H) 11/04/2018   Lab Results  Component Value Date   HDL 47 (L) 11/04/2018   Lab Results  Component Value Date   LDLCALC 139 (H) 11/04/2018   Lab Results  Component Value Date   TRIG 130 11/04/2018   Lab Results  Component Value Date   CHOLHDL 4.5 11/04/2018   No results found for: HGBA1C    Assessment & Plan:    Problem List Items Addressed This Visit      Other   Major depression, recurrent, chronic (HCC) - Primary    Overall depressive symptoms fairly well controlled still some anxiety symptoms.  Continue with current regimen of Lexapro 20 mg as well as Wellbutrin.  She is actually looking into getting a nighttime caretaker that will really help her be able to get a good night sleep which I think will overall help with her mood.      Hyperlipidemia    Due to recheck lipids.  Currently on pravastatin 40 mg daily.      Relevant Orders   COMPLETE METABOLIC PANEL WITH GFR   Lipid panel   Caregiver stress    She is actually looking into getting a nighttime caretaker that will really help her be able to get a good night sleep which I think will overall help with her mood.      Anxiety state    Other Visit Diagnoses    Renal insufficiency       Relevant Orders   COMPLETE METABOLIC PANEL WITH GFR   Lipid panel   Greater trochanteric bursitis of left hip          Hyperlipidemia-due to recheck lipids.  Last HDL was 139 a year ago so we are due to recheck that.  She also has a history of some mild renal insufficiency so  due to recheck creatinine as well.  Left trochanteric bursitis-given handout with stretches to do on her own at home recommend a trial of icing the area couple times during the day.  No orders of the defined types were placed in this encounter.   Follow-up: Return in about 6 months (around 06/13/2020) for Mood and medications .    Nani Gasser, MD

## 2019-12-18 LAB — LIPID PANEL
Cholesterol: 185 mg/dL (ref <200)
HDL: 49 mg/dL — ABNORMAL LOW (ref >=50)
LDL Cholesterol (Calc): 111 mg/dL (calc) — ABNORMAL HIGH
Non-HDL Cholesterol (Calc): 136 mg/dL (calc) — ABNORMAL HIGH (ref <130)
Total CHOL/HDL Ratio: 3.8 (calc) (ref <5.0)
Triglycerides: 135 mg/dL (ref <150)

## 2019-12-18 LAB — COMPLETE METABOLIC PANEL WITH GFR
AG Ratio: 1.7 (calc) (ref 1.0–2.5)
ALT: 16 U/L (ref 6–29)
AST: 20 U/L (ref 10–35)
Albumin: 4 g/dL (ref 3.6–5.1)
Alkaline phosphatase (APISO): 80 U/L (ref 37–153)
BUN: 22 mg/dL (ref 7–25)
CO2: 30 mmol/L (ref 20–32)
Calcium: 8.9 mg/dL (ref 8.6–10.4)
Chloride: 102 mmol/L (ref 98–110)
Creat: 0.86 mg/dL (ref 0.60–0.93)
GFR, Est African American: 76 mL/min/{1.73_m2} (ref >=60)
GFR, Est Non African American: 66 mL/min/{1.73_m2} (ref >=60)
Globulin: 2.3 g/dL (calc) (ref 1.9–3.7)
Glucose, Bld: 70 mg/dL (ref 65–99)
Potassium: 4.8 mmol/L (ref 3.5–5.3)
Sodium: 140 mmol/L (ref 135–146)
Total Bilirubin: 0.4 mg/dL (ref 0.2–1.2)
Total Protein: 6.3 g/dL (ref 6.1–8.1)

## 2019-12-19 ENCOUNTER — Other Ambulatory Visit: Payer: Self-pay | Admitting: Family Medicine

## 2019-12-23 ENCOUNTER — Encounter: Payer: Self-pay | Admitting: Family Medicine

## 2019-12-24 MED ORDER — TRAZODONE HCL 100 MG PO TABS: ORAL_TABLET | ORAL | 0 refills | Status: DC

## 2019-12-24 NOTE — Telephone Encounter (Signed)
No problem.

## 2020-01-01 ENCOUNTER — Other Ambulatory Visit: Payer: Self-pay | Admitting: Family Medicine

## 2020-01-06 ENCOUNTER — Other Ambulatory Visit: Payer: Self-pay | Admitting: Family Medicine

## 2020-01-19 ENCOUNTER — Encounter: Payer: Self-pay | Admitting: Family Medicine

## 2020-01-19 MED ORDER — HYDROXYZINE HCL 10 MG PO TABS: 10.0000 mg | ORAL_TABLET | Freq: Three times a day (TID) | ORAL | 0 refills | Status: DC | PRN

## 2020-01-19 MED ORDER — BUPROPION HCL ER (XL) 300 MG PO TB24: 300.0000 mg | ORAL_TABLET | Freq: Every day | ORAL | 1 refills | Status: DC

## 2020-01-29 ENCOUNTER — Other Ambulatory Visit: Payer: Self-pay | Admitting: Family Medicine

## 2020-02-03 ENCOUNTER — Other Ambulatory Visit: Payer: Self-pay | Admitting: Family Medicine

## 2020-02-06 ENCOUNTER — Other Ambulatory Visit: Payer: Self-pay | Admitting: Family Medicine

## 2020-02-09 ENCOUNTER — Other Ambulatory Visit: Payer: Self-pay | Admitting: Family Medicine

## 2020-02-10 ENCOUNTER — Other Ambulatory Visit: Payer: Self-pay | Admitting: Family Medicine

## 2020-02-11 MED ORDER — BUPROPION HCL ER (XL) 300 MG PO TB24: 300.0000 mg | ORAL_TABLET | Freq: Every day | ORAL | 1 refills | Status: DC

## 2020-02-15 ENCOUNTER — Other Ambulatory Visit: Payer: Self-pay | Admitting: Family Medicine

## 2020-02-17 ENCOUNTER — Encounter: Payer: Self-pay | Admitting: Family Medicine

## 2020-03-01 ENCOUNTER — Other Ambulatory Visit: Payer: Self-pay | Admitting: Family Medicine

## 2020-03-09 ENCOUNTER — Other Ambulatory Visit: Payer: Self-pay | Admitting: Family Medicine

## 2020-03-11 ENCOUNTER — Other Ambulatory Visit: Payer: Self-pay | Admitting: Family Medicine

## 2020-03-13 ENCOUNTER — Other Ambulatory Visit: Payer: Self-pay | Admitting: Family Medicine

## 2020-03-19 ENCOUNTER — Telehealth: Payer: Self-pay

## 2020-03-19 MED ORDER — HYDROXYZINE HCL 10 MG PO TABS: ORAL_TABLET | ORAL | 2 refills | Status: DC

## 2020-03-19 NOTE — Telephone Encounter (Signed)
Med sent.

## 2020-03-19 NOTE — Telephone Encounter (Signed)
Carolyn Hendrix reports headache x 1 month and felt fullness and dizziness for a few days ago. Denies vision problems, speech problems or chest, jaw pain. She has an ENT appointment on June 9th. Daughter advised to take her to urgent care/ED if symptoms worsen or has knew symptoms.   [name] states the last time she has taken hydroxyzine 2 weeks ago. She states she was taking 3-4 daily. She reports the hydroxyzine helped with trembling. She is out of refills and would like a refill on Hydroxyzine.

## 2020-03-23 NOTE — Telephone Encounter (Signed)
Pt has been updated that med refill has been sent to the pharmacy. No other inquiries during call.

## 2020-03-26 ENCOUNTER — Other Ambulatory Visit: Payer: Self-pay | Admitting: Family Medicine

## 2020-03-31 ENCOUNTER — Other Ambulatory Visit: Payer: Self-pay | Admitting: Family Medicine

## 2020-04-01 ENCOUNTER — Other Ambulatory Visit: Payer: Self-pay | Admitting: Family Medicine

## 2020-04-05 ENCOUNTER — Telehealth: Payer: Self-pay

## 2020-04-05 DIAGNOSIS — F339 Major depressive disorder, recurrent, unspecified: Secondary | ICD-10-CM

## 2020-04-05 DIAGNOSIS — F411 Generalized anxiety disorder: Secondary | ICD-10-CM

## 2020-04-05 NOTE — Telephone Encounter (Signed)
OK, I placed a referral.  Put her  On my schedule for next week. Would she be welling to esta with a therapist as well?

## 2020-04-05 NOTE — Telephone Encounter (Signed)
Carolyn Hendrix states she has been feeling very nervous and feels like she is having or did have a nervous breakdown. I have reviewed medication list with patient. She is wanting a recommendation to psychiatry in [facility]. I advised it may take a while before getting in with a psychiatry appointment. She did agree to do a virtual visit. No openings this week. Please advise.

## 2020-04-05 NOTE — Telephone Encounter (Signed)
Carolyn Hendrix's daughter called and left a message asking if there is a good psychiatrist in [place] Dr Linford Arnold would recommend. I tried to call the patient, left a message for a return call.   Number left by [name]. I do not believe this is the correct number. I called the number and was advised there wasn't anyone by that name. I even got the number from the past calls on the phone.   [phone]

## 2020-04-06 NOTE — Telephone Encounter (Signed)
Patient advised.She would also like to see a therapist.

## 2020-04-06 NOTE — Telephone Encounter (Signed)
Order placed

## 2020-04-06 NOTE — Addendum Note (Signed)
Addended by: Nani Gasser D on: 04/06/2020 06:31 PM   Modules accepted: Orders

## 2020-04-07 ENCOUNTER — Other Ambulatory Visit: Payer: Self-pay | Admitting: *Deleted

## 2020-04-07 ENCOUNTER — Encounter: Payer: Self-pay | Admitting: Family Medicine

## 2020-04-07 DIAGNOSIS — M545 Low back pain, unspecified: Secondary | ICD-10-CM

## 2020-04-07 DIAGNOSIS — G894 Chronic pain syndrome: Secondary | ICD-10-CM

## 2020-04-07 DIAGNOSIS — M5137 Other intervertebral disc degeneration, lumbosacral region: Secondary | ICD-10-CM

## 2020-04-07 DIAGNOSIS — M4727 Other spondylosis with radiculopathy, lumbosacral region: Secondary | ICD-10-CM

## 2020-04-07 DIAGNOSIS — G8929 Other chronic pain: Secondary | ICD-10-CM

## 2020-04-07 NOTE — Telephone Encounter (Signed)
Okay to place new referral? 

## 2020-04-10 ENCOUNTER — Other Ambulatory Visit: Payer: Self-pay | Admitting: Family Medicine

## 2020-04-12 NOTE — Telephone Encounter (Signed)
Arline Asp, please look into this one for patient and close note

## 2020-04-13 NOTE — Telephone Encounter (Signed)
I spoke with Archie Patten about this referral Dr. Linford Arnold wants her to have counseling and a psychiatrist which they can do both at [facility] here in [place].  I am waiting for Tonya to let me know if I need to change anything or leave it the way it is. - CF

## 2020-04-14 ENCOUNTER — Telehealth (INDEPENDENT_AMBULATORY_CARE_PROVIDER_SITE_OTHER): Payer: Medicare Other | Admitting: Family Medicine

## 2020-04-14 ENCOUNTER — Encounter: Payer: Self-pay | Admitting: Family Medicine

## 2020-04-14 DIAGNOSIS — F329 Major depressive disorder, single episode, unspecified: Secondary | ICD-10-CM | POA: Diagnosis not present

## 2020-04-14 DIAGNOSIS — Z636 Dependent relative needing care at home: Secondary | ICD-10-CM | POA: Diagnosis not present

## 2020-04-14 DIAGNOSIS — R42 Dizziness and giddiness: Secondary | ICD-10-CM

## 2020-04-14 DIAGNOSIS — F32A Depression, unspecified: Secondary | ICD-10-CM

## 2020-04-14 DIAGNOSIS — F411 Generalized anxiety disorder: Secondary | ICD-10-CM

## 2020-04-14 MED ORDER — HYDROXYZINE HCL 10 MG PO TABS: 10.0000 mg | ORAL_TABLET | Freq: Three times a day (TID) | ORAL | 3 refills | Status: DC | PRN

## 2020-04-14 NOTE — Progress Notes (Signed)
Pt was advised about the referrals and the difference between the psychiatry and therapy referral. I informed her that the psychiatry would be able to write for her medications and the therapy was for counseling(talk therapy) she stated that she would like the therapy.  She also asked about the referral being in [place] and the other in [place].

## 2020-04-14 NOTE — Telephone Encounter (Signed)
Received Fax from [facility] that they do not take patients insurance

## 2020-04-14 NOTE — Progress Notes (Signed)
Virtual Visit via Video Note  I connected with Carolyn Hendrix on 04/14/20 at  9:10 AM EDT by a video enabled telemedicine application and verified that I am speaking with the correct person using two identifiers.   I discussed the limitations of evaluation and management by telemedicine and the availability of in person appointments. The patient expressed understanding and agreed to proceed.  Patient location: Daughter's home Provider location: in office  Subjective:    CC: Vertigo and Mood  HPI: 79 year old female is following up today for recent onset of vertigo.  She has had this before but it started back up about 2 months ago and it was pretty severe but she says she gradually has been getting better.  She has had to use a cane because of lack of balance.  She is now staying with her daughter.  She says it is worse with movement and trying to walk.  Her daughter is here for the virtual visit as well.she would really like to get set up to address her medications with psychiatry.  She just really feels like her anxiety is not well controlled she is currently on Lexapro and Wellbutrin.  Her husband recently had to go into a skilled nursing facility because she was unable to care for him 24/7.  She says that when she goes he becomes very tearful he wants her to stay and emotionally that is been really difficult as well.  She is also interested in potentially working with a therapist.  Would actually place referral couple weeks ago but she has not heard back from them yet.   Past medical history, Surgical history, Family history not pertinant except as noted below, Social history, Allergies, and medications have been entered into the medical record, reviewed, and corrections made.   Review of Systems: No fevers, chills, night sweats, weight loss, chest pain, or shortness of breath.   Objective:    General: Speaking clearly in complete sentences without any shortness of breath.  Alert and  oriented x3.  Normal judgment. No apparent acute distress.    Impression and Recommendations:    No problem-specific Assessment & Plan notes found for this encounter.  Vertigo-sounds most consistent with benign positional vertigo.  Encouraged her and her daughter to go online and print out a copy of the exercises for vertigo and try to do those on her own at home.  We also discussed formal referral for vestibular rehab.  I think she would be a great candidate and would probably do well.  She does feel like she is getting better and so is interested in maybe trying the home exercises first.  Depression/anxiety-symptoms are not currently well controlled on current medication regimen but I also feel like working with a therapist and counselor can be extremely helpful for her I think a lot of this is very much situational.  Especially having on her for years.  I did not want to put her on benzodiazepines based on her age so she has been using Droxia seen as needed.  She says she recently though has been able to decrease the use of it.    Time spent in encounter 25 minutes  I discussed the assessment and treatment plan with the patient. The patient was provided an opportunity to ask questions and all were answered. The patient agreed with the plan and demonstrated an understanding of the instructions.   The patient was advised to call back or seek an in-person evaluation if the symptoms worsen  or if the condition fails to improve as anticipated.   Nani Gasser, MD

## 2020-04-15 NOTE — Telephone Encounter (Signed)
I went in and fixed referral and put Dr Gilmore Laroche as the provider so it was routed correctly. - CF

## 2020-04-15 NOTE — Telephone Encounter (Signed)
Per the last Ph note --- Arline Asp can you make sure referral is routed to Dr.Akhtar here in [place] since I do not have access? Thank you

## 2020-04-26 ENCOUNTER — Other Ambulatory Visit: Payer: Self-pay | Admitting: Family Medicine

## 2020-05-06 ENCOUNTER — Other Ambulatory Visit: Payer: Self-pay | Admitting: Family Medicine

## 2020-05-06 DIAGNOSIS — F411 Generalized anxiety disorder: Secondary | ICD-10-CM

## 2020-05-19 ENCOUNTER — Encounter: Payer: Self-pay | Admitting: Adult Health

## 2020-05-19 ENCOUNTER — Ambulatory Visit (INDEPENDENT_AMBULATORY_CARE_PROVIDER_SITE_OTHER): Payer: Medicare Other | Admitting: Adult Health

## 2020-05-19 ENCOUNTER — Other Ambulatory Visit: Payer: Self-pay

## 2020-05-19 VITALS — BP 146/81 | HR 102 | Ht 66.0 in | Wt 150.0 lb

## 2020-05-19 DIAGNOSIS — F339 Major depressive disorder, recurrent, unspecified: Secondary | ICD-10-CM

## 2020-05-19 DIAGNOSIS — F411 Generalized anxiety disorder: Secondary | ICD-10-CM | POA: Diagnosis not present

## 2020-05-19 DIAGNOSIS — G4700 Insomnia, unspecified: Secondary | ICD-10-CM

## 2020-05-19 MED ORDER — BUSPIRONE HCL 10 MG PO TABS: 10.0000 mg | ORAL_TABLET | Freq: Two times a day (BID) | ORAL | 2 refills | Status: DC

## 2020-05-19 NOTE — Progress Notes (Signed)
[facility] MD/PA/NP Initial Note  05/19/2020 4:25 PM Carolyn Hendrix  MRN:  [id number]  Chief Complaint:   HPI:   Accompanied by daughter.  Describes mood today as "a little better". Pleasant. Tearful at times. Mood symptoms - denies depression. Reports anxiety and irritability. More anxious overall. Stating "I feel anxious most of the time". Has been taking Hydroxyzine which is helpful. Reports mood instability with increased situational stressors contributing to current mood symptoms. Remarried in 2014 and husband, 83, had a stroke. He had been active and doing things prior to stroke and was unable to care for himself. Assumed caregiver roll of husband for 2 and 1/2 years. Eventually became "worn out" and went to stay with daughter. Stating "I think I had a nervous breakdown". Daughter took care of her until until she was ready to go back home. Has been back home for 2 weeks and is doing better. Trying to get house organized again. Her husband's daughters came in and placed her husband at a facility in [place] for ongoing care. He is 77 years old. She has been able to visit with him. Improved interest and motivation. Taking medications as prescribed.  Energy levels improving. Active, does not have a regular exercise routine. Works full-time  Enjoys some usual interests and activities. Married for 7 years - but separated currently. Husband moved to assisted living. Has 2 children. Accompanied by daughter. Spending time with family. Appetite adequate. Weight stable at 150 pounds.  Sleeps well most nights. Averages 8 hours. Focus and concentration "a little better now", "not as good as it should be". Completing tasks. Managing aspects of household. Retired from [place] of [employer].  Denies SI or HI. Denies AH or VH.  Previous medication trials: Buspar, Cymbalta  Visit Diagnosis:    ICD-10-CM   1. Generalized anxiety disorder  F41.1 busPIRone (BUSPAR) 10 MG tablet  2. Major  depression, recurrent, chronic (HCC)  F33.9   3. Insomnia, unspecified type  G47.00     Past Psychiatric History: Denies psychiatric hospitalization.  Past Medical History:  Past Medical History:  Diagnosis Date  . Chronic LBP    Lumbar DDD  . Colitis, ischemic (HCC)   . Depression   . Endometriosis   . GERD (gastroesophageal reflux disease)   . Hematuria   . Hyperlipidemia   . IBS (irritable bowel syndrome)    Fr Hurrelbrink- on Miralax and Citrucel daily  . Migraines   . MVP (mitral valve prolapse)     Past Surgical History:  Procedure Laterality Date  . ABDOMINAL HYSTERECTOMY  1991   w/ bilat oophorectomy for endometriosis  . APPENDECTOMY    . BREAST BIOPSY    . ESOPHAGOGASTRODUODENOSCOPY  06  . fused 5th digit right hand Right   . HEMILAMINOTOMY LUMBAR SPINE  11/18/1991   Dr. Lorain Childes  . lapartomy  1995  . OTHER SURGICAL HISTORY  1992, 1994  . right breast biopsy- benign    . SPINAL FUSION  08/18/97   Dr. Dorita Fray at [facility], L4-5 post fusion with iliac crest bone graft  . TONSILLECTOMY AND ADENOIDECTOMY      Family Psychiatric History: Denies any family history of mental illness.  Family History:  Family History  Problem Relation Age of Onset  . Alzheimer's disease Mother   . Other Father        ACS? - 101's  . Ataxia Sister     Social History:  Social History   Socioeconomic History  .  Marital status: Married    Spouse name: [name]  . Number of children: 6  . Years of education: 2112  . Highest education level: 12th grade  Occupational History  . Occupation: retired    Comment: Print production planneroffice manager [place]  Tobacco Use  . Smoking status: Former Smoker    Types: Cigarettes    Quit date: 10/23/1980    Years since quitting: 39.5  . Smokeless tobacco: Never Used  Vaping Use  . Vaping Use: Never used  Substance and Sexual Activity  . Alcohol use: No  . Drug use: No  . Sexual activity: Not Currently  Other Topics Concern  . Not on  file  Social History Narrative   Takes care of husband full time after he had a stroke last year. Stressed out due to this. Home health is in for right now. Husband condition is worsening   Social Determinants of Corporate investment bankerHealth   Financial Resource Strain:   . Difficulty of Paying Living Expenses:   Food Insecurity:   . Worried About Programme researcher, broadcasting/film/videounning Out of Food in the Last Year:   . Baristaan Out of Food in the Last Year:   Transportation Needs:   . Freight forwarderLack of Transportation (Medical):   Marland Kitchen. Lack of Transportation (Non-Medical):   Physical Activity:   . Days of Exercise per Week:   . Minutes of Exercise per Session:   Stress:   . Feeling of Stress :   Social Connections:   . Frequency of Communication with Friends and Family:   . Frequency of Social Gatherings with Friends and Family:   . Attends Religious Services:   . Active Member of Clubs or Organizations:   . Attends BankerClub or Organization Meetings:   Marland Kitchen. Marital Status:     Allergies:  Allergies  Allergen Reactions  . Buspar [Buspirone] Other (See Comments)    Dizziness   . Cymbalta [Duloxetine Hcl] Diarrhea  . Morphine     Metabolic Disorder Labs: No results found for: HGBA1C, MPG No results found for: PROLACTIN Lab Results  Component Value Date   CHOL 185 12/18/2019   TRIG 135 12/18/2019   HDL 49 (L) 12/18/2019   CHOLHDL 3.8 12/18/2019   VLDL 34 (H) 11/10/2016   LDLCALC 111 (H) 12/18/2019   LDLCALC 139 (H) 11/04/2018   Lab Results  Component Value Date   TSH 1.81 11/16/2017   TSH 1.20 11/10/2016    Therapeutic Level Labs: No results found for: LITHIUM No results found for: VALPROATE No components found for:  CBMZ  Current Medications: Current Outpatient Medications  Medication Sig Dispense Refill  . baclofen (LIORESAL) 10 MG tablet TAKE 1 TABLET BY MOUTH AT BEDTIME AS NEEDED FOR MUSCLE SPASMS 30 tablet 0  . buPROPion (WELLBUTRIN XL) 300 MG 24 hr tablet Take 1 tablet (300 mg total) by mouth daily. 90 tablet 1  . busPIRone  (BUSPAR) 10 MG tablet Take 1 tablet (10 mg total) by mouth 2 (two) times daily. 60 tablet 2  . dicyclomine (BENTYL) 10 MG capsule TAKE ONE CAPSULE 3 TIMES A DAY AS NEEDED SPASMS 270 capsule 0  . escitalopram (LEXAPRO) 20 MG tablet TAKE 1 TABLET BY MOUTH EVERY DAY 90 tablet 1  . glucosamine-chondroitin 500-400 MG tablet Take 1 tablet by mouth 2 (two) times daily.    . hydrOXYzine (ATARAX/VISTARIL) 10 MG tablet TAKE 1 TABLET (10 MG TOTAL) BY MOUTH 3 (THREE) TIMES DAILY AS NEEDED FOR ANXIETY. 270 tablet 0  . naloxone (NARCAN) nasal spray  4 mg/0.1 mL Place into the nose.    Marland Kitchen omeprazole (PRILOSEC) 20 MG capsule TAKE 1 CAPSULE BY MOUTH EVERY DAY 90 capsule 3  . pravastatin (PRAVACHOL) 40 MG tablet TAKE 1 TABLET BY MOUTH EVERY DAY 90 tablet 3  . pseudoephedrine (SUDAFED) 60 MG tablet Take 60 mg by mouth every 4 (four) hours as needed for congestion.    . traMADol (ULTRAM) 50 MG tablet TAKE 1 TO 2 TABLETS BY MOUTH 3 TIMES A DAY AS NEEDED FOR PAIN  1  . traZODone (DESYREL) 100 MG tablet TAKE 1 TABLET BY MOUTH EVERYDAY AT BEDTIME 90 tablet 0  . TURMERIC PO Take by mouth daily.     No current facility-administered medications for this visit.    Medication Side Effects: none  Orders placed this visit:  No orders of the defined types were placed in this encounter.   Psychiatric Specialty Exam:  Review of Systems  Musculoskeletal: Negative for gait problem.  Neurological: Negative for tremors.  Psychiatric/Behavioral:       Please refer to HPI    Blood pressure (!) 146/81, pulse 102, height 5\' 6"  (1.676 m), weight 150 lb (68 kg).Body mass index is 24.21 kg/m.  General Appearance: Casual, Neat and Well Groomed  Eye Contact:  Good  Speech:  Clear and Coherent and Normal Rate  Volume:  Normal  Mood:  Anxious  Affect:  Appropriate and Congruent  Thought Process:  Coherent and Descriptions of Associations: Intact  Orientation:  Full (Time, Place, and Person)  Thought Content: Logical   Suicidal  Thoughts:  No  Homicidal Thoughts:  No  Memory:  WNL  Judgement:  Good  Insight:  Good  Psychomotor Activity:  Normal  Concentration:  Concentration: Good  Recall:  Good  Fund of Knowledge: Good  Language: Good  Assets:  Communication Skills Desire for Improvement Financial Resources/Insurance Housing Intimacy Leisure Time Physical Health Resilience Social Support Talents/Skills Transportation Vocational/Educational  ADL's:  Intact  Cognition: WNL  Prognosis:  Good   Screenings:  GAD-7     Video Visit from 04/14/2020 in [facility] Office Visit from 12/15/2019 in [facility] At [facility] Office Visit from 06/13/2019 in [facility] At [facility] Office Visit from 03/13/2019 in [facility] At [facility] Office Visit from 12/11/2018 in [facility]  Total GAD-7 Score 9 3 5 6 14     Mini-Mental     Office Visit from 09/23/2014 in [facility]  Total Score (max 30 points ) 0    PHQ2-9     Video Visit from 04/14/2020 in [facility] At [facility] Office Visit from 12/15/2019 in [facility] At [facility] Clinical Support from 11/25/2019 in [facility] At [facility] Office Visit from 06/13/2019 in [facility] Primary [facility] At [facility] Office Visit from 03/13/2019 in [facility] Primary Care At [facility]  PHQ-2 Total Score 6 1 1 3 6   PHQ-9 Total Score 21 6 -- 6 15      Receiving Psychotherapy: No   Treatment Plan/Recommendations:   Plan:  PDMP reviewed  1. Continue Lexapro 20mg  daily 2. Continue Wellbutrin XL 300mg  daily 3. Continue Trazadone 100mg  at hs for sleep 4. Continue Hydroxyzine 10mg  TID anxiety 5. Add Buspar 10mg  BID for anxiety/irritability/rumination - has tried previously but is willing to try again. Does not remember  side effects.   RTC 4 weeks  Patient advised to contact office with any questions, adverse effects, or acute worsening in signs and symptoms.   Dorothyann Gibbs, NP

## 2020-05-21 ENCOUNTER — Other Ambulatory Visit: Payer: Self-pay | Admitting: Family Medicine

## 2020-06-10 ENCOUNTER — Other Ambulatory Visit: Payer: Self-pay | Admitting: Adult Health

## 2020-06-10 DIAGNOSIS — F411 Generalized anxiety disorder: Secondary | ICD-10-CM

## 2020-06-14 ENCOUNTER — Ambulatory Visit: Payer: Medicare Other | Admitting: Family Medicine

## 2020-06-14 ENCOUNTER — Other Ambulatory Visit: Payer: Self-pay | Admitting: Family Medicine

## 2020-06-16 ENCOUNTER — Telehealth (INDEPENDENT_AMBULATORY_CARE_PROVIDER_SITE_OTHER): Payer: Medicare Other | Admitting: Adult Health

## 2020-06-16 ENCOUNTER — Encounter: Payer: Self-pay | Admitting: Adult Health

## 2020-06-16 DIAGNOSIS — F411 Generalized anxiety disorder: Secondary | ICD-10-CM | POA: Diagnosis not present

## 2020-06-16 DIAGNOSIS — F339 Major depressive disorder, recurrent, unspecified: Secondary | ICD-10-CM

## 2020-06-16 DIAGNOSIS — G4700 Insomnia, unspecified: Secondary | ICD-10-CM

## 2020-06-16 MED ORDER — BUSPIRONE HCL 10 MG PO TABS: ORAL_TABLET | ORAL | 2 refills | Status: DC

## 2020-06-16 NOTE — Progress Notes (Signed)
Carolyn Hendrix [id number] [birth year] 79 y.o.  Virtual Visit via Video Note  I connected with pt @ on 06/16/20 at 12:00 PM EDT by a video enabled telemedicine application and verified that I am speaking with the correct person using two identifiers.   I discussed the limitations of evaluation and management by telemedicine and the availability of in person appointments. The patient expressed understanding and agreed to proceed.  I discussed the assessment and treatment plan with the patient. The patient was provided an opportunity to ask questions and all were answered. The patient agreed with the plan and demonstrated an understanding of the instructions.   The patient was advised to call back or seek an in-person evaluation if the symptoms worsen or if the condition fails to improve as anticipated.  I provided 30 minutes of non-face-to-face time during this encounter.  The patient was located at home.  The provider was located at [facility].   Dorothyann Gibbs, NP   Subjective:   Patient ID:  Carolyn Hendrix is a 79 y.o. (DOB 1941-06-07) female.  Chief Complaint: No chief complaint on file.   HPI DONNE ROBILLARD presents for follow-up of MDD, GAD, and insomnia.   Accompanied by daughter.  Describes mood today as "better". Pleasant. Tearful at times. Mood symptoms - reports some depression, anxiety and irritability. Feels like addition of Buspar has been helpful. Reports side effects of nausea initially, but those have passed. Situational stressors with separation from husband. Talking to her husband and trying to visit with him at nursing facility. Gets overwhelmed and shuts down when having to "deal" with husband's children. Daughter notes she has a "nervous breakdown". The most recent incident was "much milder" than it has been. Staying with daughter right now while she recovers. Improved interest and motivation. Taking medications as prescribed.  Energy levels "normal".  Active, does not have a regular exercise routine.  Enjoys some usual interests and activities. Married for 7 years - but separated currently. Husband moved to assisted living facilty. Has 2 children. Accompanied by daughter. Spending time with family. Appetite adequate. Weight stable at 150 pounds.  Sleeps well most nights. Averages 9 hours. Focus and concentration  Completing tasks. Managing some aspects of household. Retired from [employer].  Denies SI or HI. Denies AH or VH.  Previous medication trials: Buspar, Cymbalta   Review of Systems:  Review of Systems  Musculoskeletal: Negative for gait problem.  Neurological: Negative for tremors.  Psychiatric/Behavioral:       Please refer to HPI    Medications: I have reviewed the patient's current medications.  Current Outpatient Medications  Medication Sig Dispense Refill  . baclofen (LIORESAL) 10 MG tablet TAKE 1 TABLET BY MOUTH AT BEDTIME AS NEEDED FOR MUSCLE SPASMS 30 tablet 0  . buPROPion (WELLBUTRIN XL) 300 MG 24 hr tablet Take 1 tablet (300 mg total) by mouth daily. 90 tablet 1  . busPIRone (BUSPAR) 10 MG tablet Take two tablets twice daily. 120 tablet 2  . dicyclomine (BENTYL) 10 MG capsule TAKE ONE CAPSULE 3 TIMES A DAY AS NEEDED SPASMS 270 capsule 0  . escitalopram (LEXAPRO) 20 MG tablet TAKE 1 TABLET BY MOUTH EVERY DAY 90 tablet 1  . glucosamine-chondroitin 500-400 MG tablet Take 1 tablet by mouth 2 (two) times daily.    . hydrOXYzine (ATARAX/VISTARIL) 10 MG tablet TAKE 1 TABLET (10 MG TOTAL) BY MOUTH 3 (THREE) TIMES DAILY AS NEEDED FOR ANXIETY. 270 tablet 0  . naloxone (NARCAN)  nasal spray 4 mg/0.1 mL Place into the nose.    Marland Kitchen omeprazole (PRILOSEC) 20 MG capsule TAKE 1 CAPSULE BY MOUTH EVERY DAY 90 capsule 3  . pravastatin (PRAVACHOL) 40 MG tablet TAKE 1 TABLET BY MOUTH EVERY DAY 90 tablet 3  . pseudoephedrine (SUDAFED) 60 MG tablet Take 60 mg by mouth every 4 (four) hours as needed for congestion.    . traMADol  (ULTRAM) 50 MG tablet TAKE 1 TO 2 TABLETS BY MOUTH 3 TIMES A DAY AS NEEDED FOR PAIN  1  . traZODone (DESYREL) 100 MG tablet TAKE 1 TABLET BY MOUTH EVERYDAY AT BEDTIME 90 tablet 0  . TURMERIC PO Take by mouth daily.     No current facility-administered medications for this visit.    Medication Side Effects: None  Allergies:  Allergies  Allergen Reactions  . Cymbalta [Duloxetine Hcl] Diarrhea  . Morphine     Past Medical History:  Diagnosis Date  . Chronic LBP    Lumbar DDD  . Colitis, ischemic (HCC)   . Depression   . Endometriosis   . GERD (gastroesophageal reflux disease)   . Hematuria   . Hyperlipidemia   . IBS (irritable bowel syndrome)    Fr Hurrelbrink- on Miralax and Citrucel daily  . Migraines   . MVP (mitral valve prolapse)     Family History  Problem Relation Age of Onset  . Alzheimer's disease Mother   . Other Father        ACS? - 11's  . Ataxia Sister     Social History   Socioeconomic History  . Marital status: Married    Spouse name: [name]  . Number of children: 6  . Years of education: 53  . Highest education level: 12th grade  Occupational History  . Occupation: retired    Comment: Print production planner for city of [employer]  Tobacco Use  . Smoking status: Former Smoker    Types: Cigarettes    Quit date: 10/23/1980    Years since quitting: 39.6  . Smokeless tobacco: Never Used  Vaping Use  . Vaping Use: Never used  Substance and Sexual Activity  . Alcohol use: No  . Drug use: No  . Sexual activity: Not Currently  Other Topics Concern  . Not on file  Social History Narrative   Takes care of husband full time after he had a stroke last year. Stressed out due to this. Home health is in for right now. Husband condition is worsening   Social Determinants of Corporate investment banker Strain:   . Difficulty of Paying Living Expenses: Not on file  Food Insecurity:   . Worried About Programme researcher, broadcasting/film/video in the Last Year: Not on file  . Ran  Out of Food in the Last Year: Not on file  Transportation Needs:   . Lack of Transportation (Medical): Not on file  . Lack of Transportation (Non-Medical): Not on file  Physical Activity:   . Days of Exercise per Week: Not on file  . Minutes of Exercise per Session: Not on file  Stress:   . Feeling of Stress : Not on file  Social Connections:   . Frequency of Communication with Friends and Family: Not on file  . Frequency of Social Gatherings with Friends and Family: Not on file  . Attends Religious Services: Not on file  . Active Member of Clubs or Organizations: Not on file  . Attends Banker Meetings: Not on file  .  Marital Status: Not on file  Intimate Partner Violence:   . Fear of Current or Ex-Partner: Not on file  . Emotionally Abused: Not on file  . Physically Abused: Not on file  . Sexually Abused: Not on file    Past Medical History, Surgical history, Social history, and Family history were reviewed and updated as appropriate.   Please see review of systems for further details on the patient's review from today.   Objective:   Physical Exam:  There were no vitals taken for this visit.  Physical Exam Constitutional:      General: She is not in acute distress. Musculoskeletal:        General: No deformity.  Neurological:     Mental Status: She is alert and oriented to person, place, and time.     Coordination: Coordination normal.  Psychiatric:        Attention and Perception: Attention and perception normal. She does not perceive auditory or visual hallucinations.        Mood and Affect: Mood normal. Mood is not anxious or depressed. Affect is not labile, blunt, angry or inappropriate.        Speech: Speech normal.        Behavior: Behavior normal.        Thought Content: Thought content normal. Thought content is not paranoid or delusional. Thought content does not include homicidal or suicidal ideation. Thought content does not include homicidal or  suicidal plan.        Cognition and Memory: Cognition and memory normal.        Judgment: Judgment normal.     Comments: Insight intact     Lab Review:     Component Value Date/Time   NA 140 12/18/2019 1103   K 4.8 12/18/2019 1103   CL 102 12/18/2019 1103   CO2 30 12/18/2019 1103   GLUCOSE 70 12/18/2019 1103   BUN 22 12/18/2019 1103   CREATININE 0.86 12/18/2019 1103   CALCIUM 8.9 12/18/2019 1103   PROT 6.3 12/18/2019 1103   ALBUMIN 4.3 11/10/2016 0935   AST 20 12/18/2019 1103   ALT 16 12/18/2019 1103   ALKPHOS 80 11/10/2016 0935   BILITOT 0.4 12/18/2019 1103   GFRNONAA 66 12/18/2019 1103   GFRAA 76 12/18/2019 1103       Component Value Date/Time   WBC 12.0 (H) 09/23/2019 1455   RBC 4.40 09/23/2019 1455   HGB 14.0 09/23/2019 1455   HCT 41.5 09/23/2019 1455   PLT 413 (H) 09/23/2019 1455   MCV 94.3 09/23/2019 1455   MCH 31.8 09/23/2019 1455   MCHC 33.7 09/23/2019 1455   RDW 13.3 09/23/2019 1455   LYMPHSABS 1,224 09/23/2019 1455   MONOABS 399 11/10/2016 0935   EOSABS 12 (L) 09/23/2019 1455   BASOSABS 48 09/23/2019 1455    No results found for: POCLITH, LITHIUM   No results found for: PHENYTOIN, PHENOBARB, VALPROATE, CBMZ   .res Assessment: Plan:    Plan:  PDMP reviewed  1. Continue Lexapro 20mg  daily 2. Continue Wellbutrin XL 300mg  daily 3. Continue Trazadone 100mg  at hs for sleep 4. Continue Hydroxyzine 10mg  TID anxiety 5. Increase Buspar 10mg  to 15mg /20mg  BID for anxiety/irritability/rumination   RTC 4 weeks  Patient advised to contact office with any questions, adverse effects, or acute worsening in signs and symptoms   Diagnoses and all orders for this visit:  Insomnia, unspecified type  Generalized anxiety disorder -     busPIRone (BUSPAR) 10 MG tablet; Take two  tablets twice daily.  Major depression, recurrent, chronic (HCC)     Please see After Visit Summary for patient specific instructions.  Future Appointments  Date Time Provider  Department Center  06/21/2020  3:00 PM [facility], Catherine D, MD [facility] None  [facility]2020 [facility] None    No orders of the defined types were placed in this encounter.     -------------------------------

## 2020-06-21 ENCOUNTER — Telehealth: Payer: Self-pay | Admitting: Adult Health

## 2020-06-21 ENCOUNTER — Encounter: Payer: Self-pay | Admitting: Family Medicine

## 2020-06-21 ENCOUNTER — Telehealth (INDEPENDENT_AMBULATORY_CARE_PROVIDER_SITE_OTHER): Payer: Medicare Other | Admitting: Family Medicine

## 2020-06-21 VITALS — Ht 66.0 in | Wt 150.0 lb

## 2020-06-21 DIAGNOSIS — B001 Herpesviral vesicular dermatitis: Secondary | ICD-10-CM

## 2020-06-21 DIAGNOSIS — K581 Irritable bowel syndrome with constipation: Secondary | ICD-10-CM

## 2020-06-21 DIAGNOSIS — F411 Generalized anxiety disorder: Secondary | ICD-10-CM | POA: Diagnosis not present

## 2020-06-21 MED ORDER — ACYCLOVIR 5 % EX OINT
1.0000 | TOPICAL_OINTMENT | CUTANEOUS | 2 refills | Status: AC
Start: 2020-06-21 — End: ?

## 2020-06-21 MED ORDER — HYDROXYZINE HCL 10 MG PO TABS: 10.0000 mg | ORAL_TABLET | Freq: Three times a day (TID) | ORAL | 0 refills | Status: DC | PRN

## 2020-06-21 NOTE — Assessment & Plan Note (Signed)
Patient prefers appointment so we will send over Zovirax ointment.

## 2020-06-21 NOTE — Assessment & Plan Note (Addendum)
IBS constipation predominant using MiraLAX currently she is been having a lot of spasm which I do think a stress triggered.  We did discuss okay to increase her Bentyl to 2 capsules s up to 3 times a day and she says she has plenty and does not need a new prescription currently but we can update that if need be.  I discussed with her that if her IBS does not calm down in the next few weeks that we can always refer her to GI to make sure there is nothing else underlying going on.  Also discussed low food map diet and encouraged her daughter to look it up online and consider a trial.

## 2020-06-21 NOTE — Assessment & Plan Note (Addendum)
Anxiety has been really ramped up over the last couple of months I do think it somewhat situational triggered and I do think it is probably contributing evening to her IBS flare currently.  She is now working with psychiatry and is currently on Lexapro, Wellbutrin, buspirone.  Unfortunately she did not tolerate higher dose of buspirone and so her daughter has reached out to the psychiatrist to let them know and see if there can make any recommendation for change.  Says sometimes she has to take 20 mg of the hydroxyzine to help her self calm down and wants to know if it safe to do that.  Let her know that it is and I did update her prescription.

## 2020-06-21 NOTE — Telephone Encounter (Signed)
Pt's daughter, Ninetta Lights called (She's on mom's DPR to get info) Since decreasing Buspar from 40 mg to 20 mg she has had dizziness, stomach pain and restless leg.

## 2020-06-21 NOTE — Progress Notes (Addendum)
Virtual Visit via Video Note  I connected with Carolyn Hendrix on 06/21/20 at  3:00 PM EDT by a video enabled telemedicine application and verified that I am speaking with the correct person using two identifiers.   I discussed the limitations of evaluation and management by telemedicine and the availability of in person appointments. The patient expressed understanding and agreed to proceed.  Patient location: at home  Provider location: in office  Subjective:    CC: IBS followup   HPI:  Pt wanted to ask about getting a prescription for Acyclovir ointment for a fever blister.   Carolyn Hendrix says she has been under a lot of stress recently she was doing well until a few months ago.  Her husband who has been declining is now in a nursing home and she has had some contention with his family in fact she is not even communicating with them anymore.  That has created a lot of stress in her life.  Her daughter is here with her today for the virtual visit.  The daughter reports that Carolyn Hendrix actually stayed with her for about a month and then went back home for about 3 weeks and then had another anxiety episode and she stayed with them again for a short period of time she is really been struggling with increased anxiety to the point of physical symptoms including intermittent vomiting and dizziness and even hysterical laughing.  They did get her in with psychiatry and they added buspirone to her regimen.  In fact, week they increased her dose to 40 mg total but unfortunately it caused some GI side effects so she actually backed down to 20 mg.  She still taking Lexapro and Wellbutrin.  She does use the hydroxyzine as needed and thinks that she needs a refill on it.   She would like to know what the maximum dosage of Bentyl is. She is currently taking 10 mg TID PRN. She states that since she started the increased dose of Buspar she has had to use the bentyl more.  She has more constipation predominant with her  IBS but has been using MiraLAX and that has been helpful.  Says she has been trying to avoid gluten which is a big trigger for her.   Past medical history, Surgical history, Family history not pertinant except as noted below, Social history, Allergies, and medications have been entered into the medical record, reviewed, and corrections made.   Review of Systems: No fevers, chills, night sweats, weight loss, chest pain, or shortness of breath.   Objective:    General: Speaking clearly in complete sentences without any shortness of breath.  Alert and oriented x3.  Normal judgment. No apparent acute distress.    Impression and Recommendations:    Anxiety state Anxiety has been really ramped up over the last couple of months I do think it somewhat situational triggered and I do think it is probably contributing evening to her IBS flare currently.  She is now working with psychiatry and is currently on Lexapro, Wellbutrin, buspirone.  Unfortunately she did not tolerate higher dose of buspirone and so her daughter has reached out to the psychiatrist to let them know and see if there can make any recommendation for change.  Says sometimes she has to take 20 mg of the hydroxyzine to help her self calm down and wants to know if it safe to do that.  Let her know that it is and I did update her prescription.  IRRITABLE  BOWEL SYNDROME IBS constipation predominant using MiraLAX currently she is been having a lot of spasm which I do think a stress triggered.  We did discuss okay to increase her Bentyl to 2 capsules s up to 3 times a day and she says she has plenty and does not need a new prescription currently but we can update that if need be.  I discussed with her that if her IBS does not calm down in the next few weeks that we can always refer her to GI to make sure there is nothing else underlying going on.  Also discussed low food map diet and encouraged her daughter to look it up online and consider a  trial.  Recurrent cold sores Patient prefers appointment so we will send over Zovirax ointment.  2 prescription sent to pharmacy.  I discussed the assessment and treatment plan with the patient. The patient was provided an opportunity to ask questions and all were answered. The patient agreed with the plan and demonstrated an understanding of the instructions.   The patient was advised to call back or seek an in-person evaluation if the symptoms worsen or if the condition fails to improve as anticipated.   Nani Gasser, MD

## 2020-06-21 NOTE — Telephone Encounter (Signed)
Patient increased buspar to 20 mg bid for 2 days as advised last week but it did make her dizzy again, along with RLS, and she couldn't sleep. She has decreased back down to 10 mg bid, but asking if there is something else she can take for anxiety? She's taking 5-6 hydroxyzine's daily and doesn't feel real effective.     Also patient interested in setting up apt for counseling, asking if tele health can be done or how all that works? Is there someone at this office you recommend?

## 2020-06-21 NOTE — Telephone Encounter (Signed)
Her daughter isnt sure if her medication needs changed? [phone]

## 2020-06-21 NOTE — Progress Notes (Signed)
Pt wanted to ask about getting a prescription for Acyclovir ointment for a fever blister.   She would like to know what the maximum dosage of Bentyl is. She is currently taking 10 mg TID PRN. She states that since she started the increased dose of Buspar she has had to use the bentyl more.

## 2020-06-23 ENCOUNTER — Telehealth: Payer: Self-pay | Admitting: Adult Health

## 2020-06-23 NOTE — Telephone Encounter (Signed)
Left message with detailed information and to call back to discuss tomorrow

## 2020-06-23 NOTE — Telephone Encounter (Signed)
Pt sister [name] called to check status on message sent to provider about changing pt meds. Call @ [phone]

## 2020-06-23 NOTE — Telephone Encounter (Signed)
See previous phone message I sent you

## 2020-06-23 NOTE — Telephone Encounter (Signed)
Have her stop the Buspar and see if symptoms resolve. Can add a low dose of Ativan 0.5mg  daily to see if that is helpful for the anxiety. We can also get her set up to see a therapist.

## 2020-06-24 NOTE — Telephone Encounter (Signed)
Spoke with Carolyn Hendrix and she did receive my voicemail last night to stop Buspar. Today is the first day. She asked if Xanax could be sent in instead of Ativan, she use to take it years ago and could tolerate that well. Nervous about trying Ativan.   Please send Xanax to [facility] instead of local. This pharmacy is on her profile.

## 2020-06-25 ENCOUNTER — Telehealth: Payer: Self-pay | Admitting: Adult Health

## 2020-06-25 ENCOUNTER — Other Ambulatory Visit: Payer: Self-pay | Admitting: Adult Health

## 2020-06-25 MED ORDER — ALPRAZOLAM 0.25 MG PO TABS: 0.2500 mg | ORAL_TABLET | Freq: Two times a day (BID) | ORAL | 0 refills | Status: DC | PRN

## 2020-06-25 NOTE — Telephone Encounter (Signed)
Script sent  

## 2020-06-25 NOTE — Telephone Encounter (Signed)
Daughter [name] called and again checking on the xanax script that was supposed to have been called in. She said she is weaning her off the buspar and is worried that nothing will be sent in over the holiday weekend. Please send the xanax to  [facility] and please call the daughter at [phone]. This is urgent

## 2020-06-25 NOTE — Telephone Encounter (Signed)
Noted thank you

## 2020-07-07 ENCOUNTER — Other Ambulatory Visit: Payer: Self-pay | Admitting: Family Medicine

## 2020-07-14 ENCOUNTER — Other Ambulatory Visit: Payer: Self-pay | Admitting: Family Medicine

## 2020-07-14 ENCOUNTER — Other Ambulatory Visit: Payer: Self-pay

## 2020-07-14 MED ORDER — BUPROPION HCL ER (XL) 300 MG PO TB24
300.0000 mg | ORAL_TABLET | Freq: Every day | ORAL | 1 refills | Status: DC
Start: 2020-07-14 — End: 2021-01-04

## 2020-07-22 ENCOUNTER — Telehealth (INDEPENDENT_AMBULATORY_CARE_PROVIDER_SITE_OTHER): Payer: Medicare Other | Admitting: Adult Health

## 2020-07-22 ENCOUNTER — Encounter: Payer: Self-pay | Admitting: Adult Health

## 2020-07-22 DIAGNOSIS — F411 Generalized anxiety disorder: Secondary | ICD-10-CM | POA: Diagnosis not present

## 2020-07-22 DIAGNOSIS — G4700 Insomnia, unspecified: Secondary | ICD-10-CM | POA: Diagnosis not present

## 2020-07-22 DIAGNOSIS — F339 Major depressive disorder, recurrent, unspecified: Secondary | ICD-10-CM | POA: Diagnosis not present

## 2020-07-22 NOTE — Progress Notes (Signed)
Carolyn Hendrix [id number] 1941-08-19 79 y.o.  Virtual Visit via Video Note  I connected with pt @ on 07/22/20 at  8:00 AM EDT by a video enabled telemedicine application and verified that I am speaking with the correct person using two identifiers.   I discussed the limitations of evaluation and management by telemedicine and the availability of in person appointments. The patient expressed understanding and agreed to proceed.  I discussed the assessment and treatment plan with the patient. The patient was provided an opportunity to ask questions and all were answered. The patient agreed with the plan and demonstrated an understanding of the instructions.   The patient was advised to call back or seek an in-person evaluation if the symptoms worsen or if the condition fails to improve as anticipated.  I provided 30 minutes of non-face-to-face time during this encounter.  The patient was located at home.  The provider was located at [facility].   Dorothyann Gibbs, NP   Subjective:   Patient ID:  Carolyn Hendrix is a 79 y.o. (DOB 1941-09-09) female.  Chief Complaint: No chief complaint on file.   HPI Carolyn Hendrix presents for follow-up of MDD, GAD, and insomnia.   Accompanied by daughter.  Describes mood today as "so-so". Pleasant. Denies tearfulness. Mood symptoms - reports some depression, anxiety and irritability. Ongoing situational stressors increasing mood symptoms. Husband in nursing facility. Has not seen husband in 6 weeks. Stating "I still have the trembles real bad". Is back home and trying to pack up her husband's things. Having to push herself to do anything. Not wanting to go outside of the house. Gets nauseated at times. Daughter staying with her right now. Decreased interest and motivation. Taking medications as prescribed.  Energy levels stable. Active, does not have a regular exercise routine.  Not able to enjoy usual interests and activities. Married for  7 years - but separated currently. Husband moved to assisted living facilty. Has 2 children. Spending time with family. Appetite adequate. Weight stable at 150 pounds.  Sleeps well most nights. Averages 9 hours. Stating "I sleep good at night".  Focus and concentration stable. Completing tasks. Managing some aspects of household. Retired from [place] of [employer]. Denies SI or HI. Denies AH or VH.   Previous medication trials: Buspar, Cymbalta  Review of Systems:  Review of Systems  Musculoskeletal: Negative for gait problem.  Neurological: Negative for tremors.  Psychiatric/Behavioral:       Please refer to HPI    Medications: I have reviewed the patient's current medications.  Current Outpatient Medications  Medication Sig Dispense Refill  . acyclovir ointment (ZOVIRAX) 5 % Apply 1 application topically every 3 (three) hours. 5 g 2  . ALPRAZolam (XANAX) 0.25 MG tablet Take 1 tablet (0.25 mg total) by mouth 2 (two) times daily as needed for anxiety. 60 tablet 0  . baclofen (LIORESAL) 10 MG tablet TAKE 1 TABLET BY MOUTH AT BEDTIME AS NEEDED FOR MUSCLE SPASMS 30 tablet 0  . buPROPion (WELLBUTRIN XL) 300 MG 24 hr tablet Take 1 tablet (300 mg total) by mouth daily. 90 tablet 1  . busPIRone (BUSPAR) 10 MG tablet Take two tablets twice daily. 120 tablet 2  . dicyclomine (BENTYL) 10 MG capsule TAKE ONE CAPSULE 3 TIMES A DAY AS NEEDED SPASMS 270 capsule 0  . escitalopram (LEXAPRO) 20 MG tablet TAKE 1 TABLET BY MOUTH EVERY DAY 90 tablet 1  . glucosamine-chondroitin 500-400 MG tablet Take 1 tablet by mouth 2 (  two) times daily.    . hydrOXYzine (ATARAX/VISTARIL) 10 MG tablet Take 1-2 tablets (10-20 mg total) by mouth 3 (three) times daily as needed for anxiety. 360 tablet 0  . naloxone (NARCAN) nasal spray 4 mg/0.1 mL Place into the nose.    Marland Kitchen omeprazole (PRILOSEC) 20 MG capsule TAKE 1 CAPSULE BY MOUTH EVERY DAY 90 capsule 3  . pravastatin (PRAVACHOL) 40 MG tablet TAKE 1 TABLET BY MOUTH EVERY DAY  90 tablet 3  . pseudoephedrine (SUDAFED) 60 MG tablet Take 60 mg by mouth every 4 (four) hours as needed for congestion.    . traMADol (ULTRAM) 50 MG tablet TAKE 1 TO 2 TABLETS BY MOUTH 3 TIMES A DAY AS NEEDED FOR PAIN  1  . traZODone (DESYREL) 100 MG tablet TAKE 1 TABLET BY MOUTH EVERYDAY AT BEDTIME 90 tablet 0  . TURMERIC PO Take by mouth daily.     No current facility-administered medications for this visit.    Medication Side Effects: None  Allergies:  Allergies  Allergen Reactions  . Cymbalta [Duloxetine Hcl] Diarrhea  . Morphine     Past Medical History:  Diagnosis Date  . Chronic LBP    Lumbar DDD  . Colitis, ischemic (HCC)   . Depression   . Endometriosis   . GERD (gastroesophageal reflux disease)   . Hematuria   . Hyperlipidemia   . IBS (irritable bowel syndrome)    Fr Hurrelbrink- on Miralax and Citrucel daily  . Migraines   . MVP (mitral valve prolapse)     Family History  Problem Relation Age of Onset  . Alzheimer's disease Mother   . Other Father        ACS? - 12's  . Ataxia Sister     Social History   Socioeconomic History  . Marital status: Married    Spouse name: [name]  . Number of children: 6  . Years of education: 16  . Highest education level: 12th grade  Occupational History  . Occupation: retired    Comment: Print production planner for city of [employer]  Tobacco Use  . Smoking status: Former Smoker    Types: Cigarettes    Quit date: 10/23/1980    Years since quitting: 39.7  . Smokeless tobacco: Never Used  Vaping Use  . Vaping Use: Never used  Substance and Sexual Activity  . Alcohol use: No  . Drug use: No  . Sexual activity: Not Currently  Other Topics Concern  . Not on file  Social History Narrative   Takes care of husband full time after he had a stroke last year. Stressed out due to this. Home health is in for right now. Husband condition is worsening   Social Determinants of Corporate investment banker Strain:   . Difficulty  of Paying Living Expenses: Not on file  Food Insecurity:   . Worried About Programme researcher, broadcasting/film/video in the Last Year: Not on file  . Ran Out of Food in the Last Year: Not on file  Transportation Needs:   . Lack of Transportation (Medical): Not on file  . Lack of Transportation (Non-Medical): Not on file  Physical Activity:   . Days of Exercise per Week: Not on file  . Minutes of Exercise per Session: Not on file  Stress:   . Feeling of Stress : Not on file  Social Connections:   . Frequency of Communication with Friends and Family: Not on file  . Frequency of Social Gatherings with Friends and  Family: Not on file  . Attends Religious Services: Not on file  . Active Member of Clubs or Organizations: Not on file  . Attends BankerClub or Organization Meetings: Not on file  . Marital Status: Not on file  Intimate Partner Violence:   . Fear of Current or Ex-Partner: Not on file  . Emotionally Abused: Not on file  . Physically Abused: Not on file  . Sexually Abused: Not on file    Past Medical History, Surgical history, Social history, and Family history were reviewed and updated as appropriate.   Please see review of systems for further details on the patient's review from today.   Objective:   Physical Exam:  There were no vitals taken for this visit.  Physical Exam Constitutional:      General: She is not in acute distress. Musculoskeletal:        General: No deformity.  Neurological:     Mental Status: She is alert and oriented to person, place, and time.     Coordination: Coordination normal.  Psychiatric:        Attention and Perception: Attention and perception normal. She does not perceive auditory or visual hallucinations.        Mood and Affect: Mood normal. Mood is not anxious or depressed. Affect is not labile, blunt, angry or inappropriate.        Speech: Speech normal.        Behavior: Behavior normal.        Thought Content: Thought content normal. Thought content is not  paranoid or delusional. Thought content does not include homicidal or suicidal ideation. Thought content does not include homicidal or suicidal plan.        Cognition and Memory: Cognition and memory normal.        Judgment: Judgment normal.     Comments: Insight intact     Lab Review:     Component Value Date/Time   NA 140 12/18/2019 1103   K 4.8 12/18/2019 1103   CL 102 12/18/2019 1103   CO2 30 12/18/2019 1103   GLUCOSE 70 12/18/2019 1103   BUN 22 12/18/2019 1103   CREATININE 0.86 12/18/2019 1103   CALCIUM 8.9 12/18/2019 1103   PROT 6.3 12/18/2019 1103   ALBUMIN 4.3 11/10/2016 0935   AST 20 12/18/2019 1103   ALT 16 12/18/2019 1103   ALKPHOS 80 11/10/2016 0935   BILITOT 0.4 12/18/2019 1103   GFRNONAA 66 12/18/2019 1103   GFRAA 76 12/18/2019 1103       Component Value Date/Time   WBC 12.0 (H) 09/23/2019 1455   RBC 4.40 09/23/2019 1455   HGB 14.0 09/23/2019 1455   HCT 41.5 09/23/2019 1455   PLT 413 (H) 09/23/2019 1455   MCV 94.3 09/23/2019 1455   MCH 31.8 09/23/2019 1455   MCHC 33.7 09/23/2019 1455   RDW 13.3 09/23/2019 1455   LYMPHSABS 1,224 09/23/2019 1455   MONOABS 399 11/10/2016 0935   EOSABS 12 (L) 09/23/2019 1455   BASOSABS 48 09/23/2019 1455    No results found for: POCLITH, LITHIUM   No results found for: PHENYTOIN, PHENOBARB, VALPROATE, CBMZ   .res Assessment: Plan:    Plan:  PDMP reviewed  1. Continue Lexapro 20mg  daily 2. Continue Wellbutrin XL 300mg  daily 3. Continue Trazadone 100mg  at hs for sleep 4. Continue Hydroxyzine 20mg  TID anxiety 5. Start Xanax 0.25mg  BID 6. Increase Buspar 10mg  BID to 20mg  BID  RTC 4 weeks  Patient advised to contact office with any questions,  adverse effects, or acute worsening in signs and symptoms.  Discussed potential benefits, risk, and side effects of benzodiazepines to include potential risk of tolerance and dependence, as well as possible drowsiness.  Advised patient not to drive if experiencing  drowsiness and to take lowest possible effective dose to minimize risk of dependence and tolerance.  Diagnoses and all orders for this visit:  Generalized anxiety disorder  Insomnia, unspecified type  Major depression, recurrent, chronic (HCC)     Please see After Visit Summary for patient specific instructions.  Future Appointments  Date Time Provider Department Center  11/29/2020 11:00 AM [facility] None    No orders of the defined types were placed in this encounter.     -------------------------------

## 2020-07-25 ENCOUNTER — Other Ambulatory Visit: Payer: Self-pay | Admitting: Adult Health

## 2020-08-01 ENCOUNTER — Other Ambulatory Visit: Payer: Self-pay | Admitting: Family Medicine

## 2020-08-10 ENCOUNTER — Ambulatory Visit (INDEPENDENT_AMBULATORY_CARE_PROVIDER_SITE_OTHER): Payer: Medicare Other | Admitting: Family Medicine

## 2020-08-10 ENCOUNTER — Encounter: Payer: Self-pay | Admitting: Family Medicine

## 2020-08-10 VITALS — BP 131/65 | HR 93 | Ht 66.0 in | Wt 157.0 lb

## 2020-08-10 DIAGNOSIS — Z636 Dependent relative needing care at home: Secondary | ICD-10-CM

## 2020-08-10 DIAGNOSIS — Z23 Encounter for immunization: Secondary | ICD-10-CM | POA: Diagnosis not present

## 2020-08-10 DIAGNOSIS — M961 Postlaminectomy syndrome, not elsewhere classified: Secondary | ICD-10-CM | POA: Diagnosis not present

## 2020-08-10 DIAGNOSIS — F339 Major depressive disorder, recurrent, unspecified: Secondary | ICD-10-CM

## 2020-08-10 DIAGNOSIS — R42 Dizziness and giddiness: Secondary | ICD-10-CM

## 2020-08-10 DIAGNOSIS — R531 Weakness: Secondary | ICD-10-CM

## 2020-08-10 MED ORDER — BACLOFEN 10 MG PO TABS
ORAL_TABLET | ORAL | 1 refills | Status: DC
Start: 2020-08-10 — End: 2020-10-01

## 2020-08-10 NOTE — Assessment & Plan Note (Signed)
Discussed working on some home stretches and exercises she says she actually used to do some chair exercises that she would record on the TV encouraged her to get back into that showed her some specific stretches and exercises to do on her own at home to really work on core strengthening as well as strengthening her quads and hamstrings 4 standing and balancing.  We also discussed the possibility of formal physical therapy.  She says she will think about it.

## 2020-08-10 NOTE — Progress Notes (Signed)
Established Patient Office Visit  Subjective:  Patient ID: Carolyn Hendrix, female    DOB: 04/19/1941  Age: 79 y.o. MRN: [id number]  CC:  Chief Complaint  Patient presents with  . balance issues    HPI ILY DENNO presents for   Follow-up of balance issues.  She says it has gotten some better she does not feel like she is having room spinning or vertigo but says she just feels like her balance is just a little bit off if she stands up.  Particularly with position change so noticed that she just for second feels like she is swaying or starting to go to one side or if she is standing for a bit of time sometimes over like she might go back.  She admits that she has been very inactive while staying with her daughter for the last several months.  But she is now back home and doing better and starting to do some things for herself she just feels like some of it may be related to just generalized weakness.  She has been following with psychiatry, Carolyn Hendrix for her depression she feels like overall she is making progress and doing better.  She is now using her alprazolam as needed instead of twice a day.   Past Medical History:  Diagnosis Date  . Chronic LBP    Lumbar DDD  . Colitis, ischemic (HCC)   . Depression   . Endometriosis   . GERD (gastroesophageal reflux disease)   . Hematuria   . Hyperlipidemia   . IBS (irritable bowel syndrome)    Fr Hurrelbrink- on Miralax and Citrucel daily  . Migraines   . MVP (mitral valve prolapse)     Past Surgical History:  Procedure Laterality Date  . ABDOMINAL HYSTERECTOMY  1991   w/ bilat oophorectomy for endometriosis  . APPENDECTOMY    . BREAST BIOPSY    . ESOPHAGOGASTRODUODENOSCOPY  06  . fused 5th digit right hand Right   . HEMILAMINOTOMY LUMBAR SPINE  11/18/1991   Dr. Lorain Childes  . lapartomy  1995  . OTHER SURGICAL HISTORY  1992, 1994  . right breast biopsy- benign    . SPINAL FUSION  08/18/97   Dr. Dorita Fray at [facility],  L4-5 post fusion with iliac crest bone graft  . TONSILLECTOMY AND ADENOIDECTOMY      Family History  Problem Relation Age of Onset  . Alzheimer's disease Mother   . Other Father        ACS? - 51's  . Ataxia Sister     Social History   Socioeconomic History  . Marital status: Married    Spouse name: Carolyn Hendrix  . Number of children: 6  . Years of education: 67  . Highest education level: 12th grade  Occupational History  . Occupation: retired    Comment: Print production planner for city of [employer]  Tobacco Use  . Smoking status: Former Smoker    Types: Cigarettes    Quit date: 10/23/1980    Years since quitting: 39.8  . Smokeless tobacco: Never Used  Vaping Use  . Vaping Use: Never used  Substance and Sexual Activity  . Alcohol use: No  . Drug use: No  . Sexual activity: Not Currently  Other Topics Concern  . Not on file  Social History Narrative   Takes care of husband full time after he had a stroke last year. Stressed out due to this. Home health is in for right now. Husband  condition is worsening   Social Determinants of Corporate investment bankerHealth   Financial Resource Strain:   . Difficulty of Paying Living Expenses: Not on file  Food Insecurity:   . Worried About Programme researcher, broadcasting/film/videounning Out of Food in the Last Year: Not on file  . Ran Out of Food in the Last Year: Not on file  Transportation Needs:   . Lack of Transportation (Medical): Not on file  . Lack of Transportation (Non-Medical): Not on file  Physical Activity:   . Days of Exercise per Week: Not on file  . Minutes of Exercise per Session: Not on file  Stress:   . Feeling of Stress : Not on file  Social Connections:   . Frequency of Communication with Friends and Family: Not on file  . Frequency of Social Gatherings with Friends and Family: Not on file  . Attends Religious Services: Not on file  . Active Member of Clubs or Organizations: Not on file  . Attends BankerClub or Organization Meetings: Not on file  . Marital Status: Not on file   Intimate Partner Violence:   . Fear of Current or Ex-Partner: Not on file  . Emotionally Abused: Not on file  . Physically Abused: Not on file  . Sexually Abused: Not on file    Outpatient Medications Prior to Visit  Medication Sig Dispense Refill  . acyclovir ointment (ZOVIRAX) 5 % Apply 1 application topically every 3 (three) hours. 5 g 2  . ALPRAZolam (XANAX) 0.25 MG tablet TAKE 1 TABLET (0.25 MG TOTAL) BY MOUTH 2 (TWO) TIMES DAILY AS NEEDED FOR ANXIETY. 60 tablet 0  . buPROPion (WELLBUTRIN XL) 300 MG 24 hr tablet Take 1 tablet (300 mg total) by mouth daily. 90 tablet 1  . busPIRone (BUSPAR) 10 MG tablet Take two tablets twice daily. 120 tablet 2  . dicyclomine (BENTYL) 10 MG capsule TAKE ONE CAPSULE 3 TIMES A DAY AS NEEDED SPASMS 270 capsule 0  . escitalopram (LEXAPRO) 20 MG tablet TAKE 1 TABLET BY MOUTH EVERY DAY 90 tablet 1  . glucosamine-chondroitin 500-400 MG tablet Take 1 tablet by mouth 2 (two) times daily.    . hydrOXYzine (ATARAX/VISTARIL) 10 MG tablet Take 1-2 tablets (10-20 mg total) by mouth 3 (three) times daily as needed for anxiety. 360 tablet 0  . naloxone (NARCAN) nasal spray 4 mg/0.1 mL Place into the nose.    Marland Kitchen. omeprazole (PRILOSEC) 20 MG capsule TAKE 1 CAPSULE BY MOUTH EVERY DAY 90 capsule 3  . pravastatin (PRAVACHOL) 40 MG tablet TAKE 1 TABLET BY MOUTH EVERY DAY 90 tablet 3  . traMADol (ULTRAM) 50 MG tablet TAKE 1 TO 2 TABLETS BY MOUTH 3 TIMES A DAY AS NEEDED FOR PAIN  1  . traZODone (DESYREL) 100 MG tablet TAKE 1 TABLET BY MOUTH EVERYDAY AT BEDTIME 90 tablet 0  . TURMERIC PO Take by mouth daily.    . baclofen (LIORESAL) 10 MG tablet TAKE 1 TABLET BY MOUTH AT BEDTIME AS NEEDED FOR MUSCLE SPASMS 30 tablet 0  . pseudoephedrine (SUDAFED) 60 MG tablet Take 60 mg by mouth every 4 (four) hours as needed for congestion.     No facility-administered medications prior to visit.    Allergies  Allergen Reactions  . Cymbalta [Duloxetine Hcl] Diarrhea  . Morphine      ROS Review of Systems    Objective:    Physical Exam Constitutional:      Appearance: She is well-developed.  HENT:     Head: Normocephalic and atraumatic.  Cardiovascular:  Rate and Rhythm: Normal rate and regular rhythm.     Heart sounds: Normal heart sounds.  Pulmonary:     Effort: Pulmonary effort is normal.     Breath sounds: Normal breath sounds.  Musculoskeletal:     Comments: Watching her walk she actually seems to rotate her hip forward on that right side to get that leg forward she says her back was bothering her some while watching her walk when she walked a second time the gait seemed a little bit smoother but still moving that hip forward.  Skin:    General: Skin is warm and dry.  Neurological:     Mental Status: She is alert and oriented to person, place, and time.  Psychiatric:        Behavior: Behavior normal.     BP 131/65   Pulse 93   Ht 5\' 6"  (1.676 m)   Wt 157 lb (71.2 kg)   SpO2 99%   BMI 25.34 kg/m  Wt Readings from Last 3 Encounters:  08/10/20 157 lb (71.2 kg)  06/21/20 150 lb (68 kg)  12/15/19 151 lb (68.5 kg)     Health Maintenance Due  Topic Date Due  . Hepatitis C Screening  Never done    There are no preventive care reminders to display for this patient.  Lab Results  Component Value Date   TSH 1.81 11/16/2017   Lab Results  Component Value Date   WBC 12.0 (H) 09/23/2019   HGB 14.0 09/23/2019   HCT 41.5 09/23/2019   MCV 94.3 09/23/2019   PLT 413 (H) 09/23/2019   Lab Results  Component Value Date   NA 140 12/18/2019   K 4.8 12/18/2019   CO2 30 12/18/2019   GLUCOSE 70 12/18/2019   BUN 22 12/18/2019   CREATININE 0.86 12/18/2019   BILITOT 0.4 12/18/2019   ALKPHOS 80 11/10/2016   AST 20 12/18/2019   ALT 16 12/18/2019   PROT 6.3 12/18/2019   ALBUMIN 4.3 11/10/2016   CALCIUM 8.9 12/18/2019   Lab Results  Component Value Date   CHOL 185 12/18/2019   Lab Results  Component Value Date   HDL 49 (L) 12/18/2019    Lab Results  Component Value Date   LDLCALC 111 (H) 12/18/2019   Lab Results  Component Value Date   TRIG 135 12/18/2019   Lab Results  Component Value Date   CHOLHDL 3.8 12/18/2019   No results found for: HGBA1C    Assessment & Plan:   Problem List Items Addressed This Visit      Other   Postlaminectomy syndrome, not elsewhere classified    He would like a refill on her baclofen.  It was evidently sent to the pharmacy close to her daughter's house but she would like his sent here locally.  We will send over new prescription today.      Major depression, recurrent, chronic (HCC)    Continue to follow with Dr. 12/20/2019.  She is on alprazolam and says she has been using it as needed lately.      Generalized weakness    Discussed working on some home stretches and exercises she says she actually used to do some chair exercises that she would record on the TV encouraged her to get back into that showed her some specific stretches and exercises to do on her own at home to really work on core strengthening as well as strengthening her quads and hamstrings 4 standing and balancing.  We also  discussed the possibility of formal physical therapy.  She says she will think about it.      Dysequilibrium    I do think some of this may be related to her weakness and did discuss working on core strengthening even the possibility of formal physical therapy.  Consider a vestibular brain lesion as a possibility consider brain MRI for further work-up.  She is also on multiple sedating drugs including tramadol, trazodone, Xanax, buspirone, and baclofen      Relevant Orders   MR Brain Wo Contrast   Caregiver stress    She is now back home and her husband is in a rehab facility.  Unfortunately his health has continued to decline.  And this has been an enormous amount of stress for her.       Other Visit Diagnoses    Need for immunization against influenza    -  Primary   Relevant Orders    Flu Vaccine QUAD High Dose(Fluad) (Completed)      Meds ordered this encounter  Medications  . baclofen (LIORESAL) 10 MG tablet    Sig: TAKE 1 TABLET BY MOUTH AT BEDTIME AS NEEDED FOR MUSCLE SPASMS    Dispense:  30 tablet    Refill:  1    Follow-up: Return in about 4 months (around 12/11/2020).    Nani Gasser, MD

## 2020-08-10 NOTE — Assessment & Plan Note (Signed)
She is now back home and her husband is in a rehab facility.  Unfortunately his health has continued to decline.  And this has been an enormous amount of stress for her.

## 2020-08-10 NOTE — Assessment & Plan Note (Addendum)
I do think some of this may be related to her weakness and did discuss working on core strengthening even the possibility of formal physical therapy.  Consider a vestibular brain lesion as a possibility consider brain MRI for further work-up.  She is also on multiple sedating drugs including tramadol, trazodone, Xanax, buspirone, and baclofen

## 2020-08-10 NOTE — Assessment & Plan Note (Signed)
He would like a refill on her baclofen.  It was evidently sent to the pharmacy close to her daughter's house but she would like his sent here locally.  We will send over new prescription today.

## 2020-08-10 NOTE — Assessment & Plan Note (Addendum)
Continue to follow with Dr. Kallie Locks.  She is on alprazolam and says she has been using it as needed lately.

## 2020-08-14 ENCOUNTER — Other Ambulatory Visit: Payer: Self-pay | Admitting: Family Medicine

## 2020-08-14 DIAGNOSIS — F411 Generalized anxiety disorder: Secondary | ICD-10-CM

## 2020-09-01 ENCOUNTER — Other Ambulatory Visit: Payer: Self-pay | Admitting: Family Medicine

## 2020-09-05 ENCOUNTER — Other Ambulatory Visit: Payer: Self-pay | Admitting: Adult Health

## 2020-09-06 ENCOUNTER — Telehealth: Payer: Self-pay | Admitting: Adult Health

## 2020-09-06 ENCOUNTER — Other Ambulatory Visit: Payer: Self-pay

## 2020-09-06 MED ORDER — ALPRAZOLAM 0.25 MG PO TABS: 0.2500 mg | ORAL_TABLET | Freq: Two times a day (BID) | ORAL | 0 refills | Status: DC | PRN

## 2020-09-06 NOTE — Telephone Encounter (Signed)
Pt is not in [facility] now to get her RF of Lorazepam that was just approved. Can we resend it to the : [facility] [address] in [place], [place] please.

## 2020-09-06 NOTE — Telephone Encounter (Signed)
Noted change pharmacy

## 2020-09-10 ENCOUNTER — Ambulatory Visit (HOSPITAL_COMMUNITY)
Admission: RE | Admit: 2020-09-10 | Discharge: 2020-09-10 | Disposition: A | Discharge status: Home / Self Care | Payer: Medicare Other | Source: Ambulatory Visit | Attending: Family Medicine | Admitting: Family Medicine

## 2020-09-10 ENCOUNTER — Other Ambulatory Visit: Payer: Self-pay

## 2020-09-10 DIAGNOSIS — R42 Dizziness and giddiness: Secondary | ICD-10-CM | POA: Insufficient documentation

## 2020-09-12 ENCOUNTER — Other Ambulatory Visit: Payer: Self-pay | Admitting: Family Medicine

## 2020-09-15 ENCOUNTER — Other Ambulatory Visit: Payer: Self-pay | Admitting: Adult Health

## 2020-09-15 DIAGNOSIS — F411 Generalized anxiety disorder: Secondary | ICD-10-CM

## 2020-09-20 ENCOUNTER — Other Ambulatory Visit: Payer: Self-pay | Admitting: Family Medicine

## 2020-09-21 ENCOUNTER — Encounter: Payer: Self-pay | Admitting: Family Medicine

## 2020-09-21 ENCOUNTER — Telehealth: Payer: Self-pay | Admitting: Adult Health

## 2020-09-21 DIAGNOSIS — R42 Dizziness and giddiness: Secondary | ICD-10-CM

## 2020-09-21 NOTE — Telephone Encounter (Signed)
Referral pended

## 2020-09-21 NOTE — Telephone Encounter (Signed)
review 

## 2020-09-21 NOTE — Telephone Encounter (Signed)
Daughter, [name], called to report that her Carolyn Hendrix has been on buspar 20mg  but it causes dizziness so they are going to taper down to 10mg .  The plan is to taper all the way off to see if it helps get rid of the dizziness.  If you have concerns about this, please call .  [phone]

## 2020-09-22 NOTE — Telephone Encounter (Signed)
Noted  

## 2020-09-29 ENCOUNTER — Other Ambulatory Visit: Payer: Self-pay

## 2020-09-29 ENCOUNTER — Encounter: Payer: Self-pay | Admitting: Physical Therapy

## 2020-09-29 ENCOUNTER — Ambulatory Visit (INDEPENDENT_AMBULATORY_CARE_PROVIDER_SITE_OTHER): Payer: Medicare Other | Admitting: Physical Therapy

## 2020-09-29 DIAGNOSIS — R2681 Unsteadiness on feet: Secondary | ICD-10-CM | POA: Diagnosis not present

## 2020-09-29 DIAGNOSIS — R2689 Other abnormalities of gait and mobility: Secondary | ICD-10-CM | POA: Diagnosis not present

## 2020-09-29 DIAGNOSIS — M6281 Muscle weakness (generalized): Secondary | ICD-10-CM | POA: Diagnosis not present

## 2020-09-29 NOTE — Therapy (Signed)
[facility] [place] [address] [place], [place], [address] Phone: [phone]   Fax:  [phone]  Physical Therapy Evaluation  Patient Details  Name: Carolyn Hendrix MRN: [id number] Date of Birth: [birth year] Referring Provider (PT): Nani Gasser    Encounter Date: 09/29/2020   PT End of Session - 09/29/20 1705    Visit Number 1    Number of Visits 12    Date for PT Re-Evaluation 11/10/20    Authorization Type [employer] Medicare    Authorization Time Period 09/29/20 to 11/10/20    Authorization - Visit Number 1    Authorization - Number of Visits 12    Progress Note Due on Visit 10    PT Start Time 1519    PT Stop Time 1557    PT Time Calculation (min) 38 min    Activity Tolerance Patient tolerated treatment well    Behavior During Therapy [facility] for tasks assessed/performed           Past Medical History:  Diagnosis Date  . Chronic LBP    Lumbar DDD  . Colitis, ischemic (HCC)   . Depression   . Endometriosis   . GERD (gastroesophageal reflux disease)   . Hematuria   . Hyperlipidemia   . IBS (irritable bowel syndrome)    Fr Hurrelbrink- on Miralax and Citrucel daily  . Migraines   . MVP (mitral valve prolapse)     Past Surgical History:  Procedure Laterality Date  . ABDOMINAL HYSTERECTOMY  1991   w/ bilat oophorectomy for endometriosis  . APPENDECTOMY    . BREAST BIOPSY    . ESOPHAGOGASTRODUODENOSCOPY  06  . fused 5th digit right hand Right   . HEMILAMINOTOMY LUMBAR SPINE  11/18/1991   Dr. Lorain Childes  . lapartomy  1995  . OTHER SURGICAL HISTORY  1992, 1994  . right breast biopsy- benign    . SPINAL FUSION  08/18/97   Dr. Dorita Fray at [facility], L4-5 post fusion with iliac crest bone graft  . TONSILLECTOMY AND ADENOIDECTOMY      There were no vitals filed for this visit.    Subjective Assessment - 09/29/20 1520    Subjective I had a physical/emotional/mental breakdown this year. It started with vertigo  but now is more unsteadiness. I can just be standing around and will suddenly lose my balance one way or another. Unsteadiness is my biggest concern right now. I never feel dizzy or like the world is spinning. I had nausea in the beginning. The dizziness started end of May but I really don't notice it anymore, its been a couple months.    Pertinent History no falls    Patient Stated Goals be able to walk without fear of falling    Currently in Pain? No/denies              [facility] PT Assessment - 09/29/20 0001      Assessment   Medical Diagnosis dysequilibrium, vertigo     Referring Provider (PT) Nani Gasser     Onset Date/Surgical Date --   May 2021    Next MD Visit Dr. Linford Arnold in February     Prior Therapy PT for frozen shoulder years ago      Precautions   Precautions None      Restrictions   Weight Bearing Restrictions No      Balance Screen   Has the patient fallen in the past 6 months No   has had 2 close  calls in the past 6 months    Has the patient had a decrease in activity level because of a fear of falling?  No    Is the patient reluctant to leave their home because of a fear of falling?  No      Home [employer]ist information centre manager residence      Prior Function   Level of Independence Independent    Vocation Retired    Leisure going to [employer], reading, crochet, puzzles       Sensation   Additional Comments 15.3 seconds for 5x STS test       Strength   Right Hip Flexion 3/5    Right Hip Extension 3/5    Right Hip ABduction 4+/5    Left Hip Flexion 3+/5    Left Hip Extension 3/5    Left Hip ABduction 4+/5    Right Knee Flexion 4-/5    Right Knee Extension 4-/5    Left Knee Flexion 4+/5    Left Knee Extension 4/5    Right Ankle Dorsiflexion 4+/5    Left Ankle Dorsiflexion 4+/5      Standardized Balance Assessment   Standardized Balance Assessment Dynamic Gait Index      Dynamic Gait Index   Level Surface Mild Impairment    Change  in Gait Speed Mild Impairment    Gait with Horizontal Head Turns Moderate Impairment    Gait with Vertical Head Turns Moderate Impairment    Gait and Pivot Turn Moderate Impairment    Step Over Obstacle Moderate Impairment    Step Around Obstacles Moderate Impairment    Steps Moderate Impairment    Total Score 10      High Level Balance   High Level Balance Comments SLS 4 seconds B; tandem stance 18 seconds L foot back, 30 seconds R foot back                       Objective measurements completed on examination: See above findings.               PT Education - 09/29/20 1705    Education Details exam findings, POC, HEP, measures to prevent falls, importance of compliance with HEP for steady progress    Person(s) Educated Patient    Methods Explanation    Comprehension Verbalized understanding;Returned demonstration            PT Short Term Goals - 09/29/20 1709      PT SHORT TERM GOAL #1   Title Will be independent with HEP to be updated PRN    Time 3    Period Weeks    Status New    Target Date 10/20/20      PT SHORT TERM GOAL #2   Title Will be able to maintain tandem stance for at least 30 seconds bilaterally without UE support    Time 3    Period Weeks    Status New      PT SHORT TERM GOAL #3   Title Will be able to state at least 4 precautions she can take at home (for example, picking up throw rugs and good lighting) to help prevent falls    Time 3    Period Weeks    Status New             PT Long Term Goals - 09/29/20 1710      PT LONG TERM GOAL #1  Title Will improve MMT by at least 1 grade in order to show improved strenth and assist in reducing fall risk    Time 6    Period Weeks    Status New    Target Date 11/10/20      PT LONG TERM GOAL #2   Title Will score at least 18/24 on DGI to show reduced fall risk    Time 6    Period Weeks    Status New      PT LONG TERM GOAL #3   Title Will be able to maintain SLS for at  least 15 seconds bilaterally to show reduced fall risk    Time 6    Period Weeks    Status New      PT LONG TERM GOAL #4   Title Will independently participate in 30 minutes of preferred exercise at least 3 times per week to improve functional activity tolerance and reduce fall risk    Time 6    Period Weeks    Status New                  Plan - 09/29/20 1706    Clinical Impression Statement Ms. Doubrava arrives today reporting that she is very concerned about her balance and her overall strength- she had a complete breakdown earlier this year and spent time laying in bed where she did not do much at all. Has not had dizziness for months now but is very concerned about her balance. Examination reveals significant impairments in balance, strength, and gross conditioning- she is a very high fall risk as she scored only 10/24 on DGI today. Will strongly benefit from skilled PT services to address all deficits and prevent fall related injury moving forward.    Personal Factors and Comorbidities Age;Social Background;Behavior Pattern;Fitness;Past/Current Experience;Time since onset of injury/illness/exacerbation    Examination-Activity Limitations Squat;Stairs;Locomotion Level;Caring for Others;Transfers    Examination-Participation Restrictions Shop;Community Activity;Yard Work;Cleaning    Stability/Clinical Decision Making Evolving/Moderate complexity    Clinical Decision Making Moderate    Rehab Potential Good    PT Frequency 2x / week    PT Duration 6 weeks    PT Treatment/Interventions ADLs/Self Care Home Management;Aquatic Therapy;Cryotherapy;Electrical Stimulation;Iontophoresis 4mg /ml Dexamethasone;Moist Heat;Ultrasound;DME Instruction;Gait training;Stair training;Functional mobility training;Therapeutic activities;Therapeutic exercise;Balance training;Neuromuscular re-education;Patient/family education;Manual techniques;Energy conservation;Taping;Vestibular    PT Next Visit Plan  review HEP; focus on gross strength, balance, and conditioning    PT Home Exercise Plan [email or web address]    Consulted and Agree with Plan of Care Patient           Patient will benefit from skilled therapeutic intervention in order to improve the following deficits and impairments:  Abnormal gait, Decreased balance, Decreased endurance, Decreased mobility, Difficulty walking, Decreased activity tolerance, Impaired flexibility  Visit Diagnosis: Unsteadiness on feet  Muscle weakness (generalized)  Other abnormalities of gait and mobility     Problem List Patient Active Problem List   Diagnosis Date Noted  . Dysequilibrium 08/10/2020  . 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 Caregiver stress 12/15/2019  . Primary osteoarthritis of both hips 10/08/2019  . Pseudogout of knee 06/10/2019  . Osteopenia 12/04/2018  . Avulsion fracture of right talus 06/27/2017  . Rotator cuff tendonitis, left 05/11/2017  . Hyperlipidemia 11/10/2016  . S/P insertion of spinal cord stimulator 02/23/2016  . Segmental and somatic dysfunction of lumbar region 06/11/2015  . Gluten intolerance 09/23/2013  . Postlaminectomy syndrome, not elsewhere classified 09/03/2013  . Low back pain 03/29/2013  . Lumbosacral  spondylosis 03/29/2013  . Spasm of muscle 03/29/2013  . Disorder of sacrum 03/29/2013  . Chronic pain 03/29/2013  . Depressive disorder 03/29/2013  . Constipation 03/29/2013  . Anxiety state 03/29/2013  . History of mallet deformity correction, possible osteomyelitis 03/20/2013  . Insomnia 03/18/2013  . OSTEOARTHRITIS, HIP, RIGHT 02/14/2010  . Osteoarthritis 02/14/2010  . Anemia 06/02/2009  . 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 IRRITABLE BOWEL SYNDROME 06/02/2008  . Generalized weakness 06/02/2008  . Malaise and fatigue 06/02/2008  . GERD 03/30/2008  . DIVERTICULOSIS OF COLON 03/30/2008  . Major depression, recurrent, chronic (HCC) 02/17/2008  . ENDOMETRIOSIS 02/17/2008  . DISC DISEASE, LUMBAR 02/17/2008  .  ISCHEMIC COLITIS, HX OF 02/17/2008   Lerry LinerKristen U PT, DPT, PN1   Supplemental Physical Therapist [facility]    Pager [phone] Acute Rehab Office [phone]    [facility] [place] [address] [place], [place], [address] Phone: [phone]   Fax:  [phone]  Name: Kathlene NovemberSandra R Mauritz MRN: [id number] Date of Birth: 1941-05-27

## 2020-09-30 ENCOUNTER — Other Ambulatory Visit: Payer: Self-pay | Admitting: Family Medicine

## 2020-10-04 ENCOUNTER — Other Ambulatory Visit: Payer: Self-pay | Admitting: Family Medicine

## 2020-10-05 ENCOUNTER — Encounter: Payer: Self-pay | Admitting: Physical Therapy

## 2020-10-05 ENCOUNTER — Other Ambulatory Visit: Payer: Self-pay

## 2020-10-05 ENCOUNTER — Ambulatory Visit (INDEPENDENT_AMBULATORY_CARE_PROVIDER_SITE_OTHER): Payer: Medicare Other | Admitting: Physical Therapy

## 2020-10-05 DIAGNOSIS — R2681 Unsteadiness on feet: Secondary | ICD-10-CM | POA: Diagnosis not present

## 2020-10-05 DIAGNOSIS — R2689 Other abnormalities of gait and mobility: Secondary | ICD-10-CM

## 2020-10-05 DIAGNOSIS — M6281 Muscle weakness (generalized): Secondary | ICD-10-CM | POA: Diagnosis not present

## 2020-10-05 NOTE — Therapy (Signed)
[facility] [place] [address] [place], [place], [address] Phone: [phone]   Fax:  [phone]  Physical Therapy Treatment  Patient Details  Name: Carolyn Hendrix MRN: [id number] Date of Birth: [birth year] Referring Provider (PT): Nani Gasser    Encounter Date: 10/05/2020   PT End of Session - 10/05/20 1142    Visit Number 2    Number of Visits 12    Date for PT Re-Evaluation 11/10/20    Authorization Type UHC Medicare    Authorization Time Period 09/29/20 to 11/10/20    Authorization - Visit Number 2    Authorization - Number of Visits 12    Progress Note Due on Visit 10    PT Start Time 1100    PT Stop Time 1142    PT Time Calculation (min) 42 min    Activity Tolerance Patient tolerated treatment well    Behavior During Therapy [facility] for tasks assessed/performed           Past Medical History:  Diagnosis Date  . Chronic LBP    Lumbar DDD  . Colitis, ischemic (HCC)   . Depression   . Endometriosis   . GERD (gastroesophageal reflux disease)   . Hematuria   . Hyperlipidemia   . IBS (irritable bowel syndrome)    Fr Hurrelbrink- on Miralax and Citrucel daily  . Migraines   . MVP (mitral valve prolapse)     Past Surgical History:  Procedure Laterality Date  . ABDOMINAL HYSTERECTOMY  1991   w/ bilat oophorectomy for endometriosis  . APPENDECTOMY    . BREAST BIOPSY    . ESOPHAGOGASTRODUODENOSCOPY  06  . fused 5th digit right hand Right   . HEMILAMINOTOMY LUMBAR SPINE  11/18/1991   Dr. Lorain Childes  . lapartomy  1995  . OTHER SURGICAL HISTORY  1992, 1994  . right breast biopsy- benign    . SPINAL FUSION  08/18/97   Dr. Dorita Fray at [facility], L4-5 post fusion with iliac crest bone graft  . TONSILLECTOMY AND ADENOIDECTOMY      There were no vitals filed for this visit.   Subjective Assessment - 10/05/20 1106    Subjective I feel "fair" today. Pt states she was very "off balance'' on recent trip to  [place]. Many near falls.    Patient Stated Goals be able to walk without fear of falling    Currently in Pain? No/denies                             [facility] Adult PT Treatment/Exercise - 10/05/20 0001      High Level Balance   High Level Balance Activities Sudden stops;Head turns    High Level Balance Comments standing on foam squat and reach with cones      Exercises   Exercises Knee/Hip      Knee/Hip Exercises: Standing   Heel Raises Both;2 sets;10 reps   2#   Knee Flexion Strengthening;Both;2 sets;10 reps;Other (comment)   2#   Hip Abduction Stengthening;Both;2 sets;10 reps   2#   Other Standing Knee Exercises mini squats 2 x 10      Knee/Hip Exercises: Seated   Sit to Sand 10 reps                  PT Education - 10/05/20 1117    Education Details revised HEP    Person(s) Educated Patient    Methods Explanation;Demonstration;Handout  Comprehension Verbalized understanding;Returned demonstration            PT Short Term Goals - 09/29/20 1709      PT SHORT TERM GOAL #1   Title Will be independent with HEP to be updated PRN    Time 3    Period Weeks    Status New    Target Date 10/20/20      PT SHORT TERM GOAL #2   Title Will be able to maintain tandem stance for at least 30 seconds bilaterally without UE support    Time 3    Period Weeks    Status New      PT SHORT TERM GOAL #3   Title Will be able to state at least 4 precautions she can take at home (for example, picking up throw rugs and good lighting) to help prevent falls    Time 3    Period Weeks    Status New             PT Long Term Goals - 09/29/20 1710      PT LONG TERM GOAL #1   Title Will improve MMT by at least 1 grade in order to show improved strenth and assist in reducing fall risk    Time 6    Period Weeks    Status New    Target Date 11/10/20      PT LONG TERM GOAL #2   Title Will score at least 18/24 on DGI to show reduced fall risk    Time 6     Period Weeks    Status New      PT LONG TERM GOAL #3   Title Will be able to maintain SLS for at least 15 seconds bilaterally to show reduced fall risk    Time 6    Period Weeks    Status New      PT LONG TERM GOAL #4   Title Will independently participate in 30 minutes of preferred exercise at least 3 times per week to improve functional activity tolerance and reduce fall risk    Time 6    Period Weeks    Status New                 Plan - 10/05/20 1143    Clinical Impression Statement Pt improving with tandem stance and SLS.  requires occasional min A during gait with head turns down and to the Right. Pt requires seated rest break during standing therex with 2#    Personal Factors and Comorbidities Age;Social Background;Behavior Pattern;Fitness;Past/Current Experience;Time since onset of injury/illness/exacerbation    Examination-Activity Limitations Squat;Stairs;Locomotion Level;Caring for Others;Transfers    Examination-Participation Restrictions Shop;Community Activity;Yard Work;Cleaning    Stability/Clinical Decision Making Evolving/Moderate complexity    Rehab Potential Good    PT Frequency 2x / week    PT Duration 6 weeks    PT Treatment/Interventions ADLs/Self Care Home Management;Aquatic Therapy;Cryotherapy;Electrical Stimulation;Iontophoresis 4mg /ml Dexamethasone;Moist Heat;Ultrasound;DME Instruction;Gait training;Stair training;Functional mobility training;Therapeutic activities;Therapeutic exercise;Balance training;Neuromuscular re-education;Patient/family education;Manual techniques;Energy conservation;Taping;Vestibular    PT Next Visit Plan review HEP, progress dynamic balance    PT Home Exercise Plan [email or web address]    Consulted and Agree with Plan of Care Patient           Patient will benefit from skilled therapeutic intervention in order to improve the following deficits and impairments:  Abnormal gait,Decreased balance,Decreased endurance,Decreased  mobility,Difficulty walking,Decreased activity tolerance,Impaired flexibility,Decreased strength  Visit Diagnosis: Unsteadiness on feet  Muscle weakness (generalized)  Other abnormalities of gait and mobility     Problem List Patient Active Problem List   Diagnosis Date Noted  . Dysequilibrium 08/10/2020  . 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 Caregiver stress 12/15/2019  . Primary osteoarthritis of both hips 10/08/2019  . Pseudogout of knee 06/10/2019  . Osteopenia 12/04/2018  . Avulsion fracture of right talus 06/27/2017  . Rotator cuff tendonitis, left 05/11/2017  . Hyperlipidemia 11/10/2016  . S/P insertion of spinal cord stimulator 02/23/2016  . Segmental and somatic dysfunction of lumbar region 06/11/2015  . Gluten intolerance 09/23/2013  . Postlaminectomy syndrome, not elsewhere classified 09/03/2013  . Low back pain 03/29/2013  . Lumbosacral spondylosis 03/29/2013  . Spasm of muscle 03/29/2013  . Disorder of sacrum 03/29/2013  . Chronic pain 03/29/2013  . Depressive disorder 03/29/2013  . Constipation 03/29/2013  . Anxiety state 03/29/2013  . History of mallet deformity correction, possible osteomyelitis 03/20/2013  . Insomnia 03/18/2013  . OSTEOARTHRITIS, HIP, RIGHT 02/14/2010  . Osteoarthritis 02/14/2010  . Anemia 06/02/2009  . 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 IRRITABLE BOWEL SYNDROME 06/02/2008  . Generalized weakness 06/02/2008  . Malaise and fatigue 06/02/2008  . GERD 03/30/2008  . DIVERTICULOSIS OF COLON 03/30/2008  . Major depression, recurrent, chronic (HCC) 02/17/2008  . ENDOMETRIOSIS 02/17/2008  . DISC DISEASE, LUMBAR 02/17/2008  . ISCHEMIC COLITIS, HX OF 02/17/2008   Ayaan Shutes, PT  [employer] 10/05/2020, 11:46 AM  [facility] [address] [place], [place], [address] Phone: [phone]   Fax:  [phone]  Name: Carolyn Hendrix MRN: [id number] Date of Birth: [birth year]

## 2020-10-05 NOTE — Patient Instructions (Signed)
Access Code: [email or web address] URL: [email or web address] Date: 10/05/2020 Prepared by: Reggy Eye  Exercises Standing Tandem Balance with Counter Support - 2 x daily - 7 x weekly - 1 sets - 3 reps - 15-30 hold Single Leg Stance with Support - 2 x daily - 7 x weekly - 1 sets - 3 reps - 5-15 hold Standing Hip Abduction - 1 x daily - 7 x weekly - 3 sets - 10 reps Standing Knee Flexion AROM with Chair Support - 1 x daily - 7 x weekly - 3 sets - 10 reps Standing Heel Raise with Support - 1 x daily - 7 x weekly - 3 sets - 10 reps Mini Squat with Counter Support - 1 x daily - 7 x weekly - 3 sets - 10 reps Sit to Stand - 1 x daily - 7 x weekly - 2 sets - 10 reps

## 2020-10-06 ENCOUNTER — Other Ambulatory Visit: Payer: Self-pay | Admitting: Family Medicine

## 2020-10-06 DIAGNOSIS — F411 Generalized anxiety disorder: Secondary | ICD-10-CM

## 2020-10-07 ENCOUNTER — Encounter: Payer: Medicare Other | Admitting: Rehabilitative and Restorative Service Providers"

## 2020-10-09 ENCOUNTER — Other Ambulatory Visit: Payer: Self-pay | Admitting: Adult Health

## 2020-10-12 ENCOUNTER — Encounter: Payer: Medicare Other | Admitting: Physical Therapy

## 2020-10-19 ENCOUNTER — Other Ambulatory Visit: Payer: Self-pay

## 2020-10-19 ENCOUNTER — Ambulatory Visit (INDEPENDENT_AMBULATORY_CARE_PROVIDER_SITE_OTHER): Payer: Medicare Other | Admitting: Rehabilitative and Restorative Service Providers"

## 2020-10-19 DIAGNOSIS — R2689 Other abnormalities of gait and mobility: Secondary | ICD-10-CM

## 2020-10-19 DIAGNOSIS — M6281 Muscle weakness (generalized): Secondary | ICD-10-CM

## 2020-10-19 DIAGNOSIS — R2681 Unsteadiness on feet: Secondary | ICD-10-CM | POA: Diagnosis not present

## 2020-10-19 NOTE — Therapy (Signed)
[facility] [place] [address] [place], [place], [address] Phone: [phone]   Fax:  [phone]  Physical Therapy Treatment  Patient Details  Name: Carolyn Hendrix MRN: [id number] Date of Birth: [birth year] Referring Provider (PT): Nani Gasser    Encounter Date: 10/19/2020   PT End of Session - 10/19/20 1522    Visit Number 3    Number of Visits 12    Date for PT Re-Evaluation 11/10/20    Authorization Type UHC Medicare    Authorization Time Period 09/29/20 to 11/10/20    Authorization - Visit Number 3    Authorization - Number of Visits 12    Progress Note Due on Visit 10    PT Start Time 1200    PT Stop Time 1240    PT Time Calculation (min) 40 min    Activity Tolerance Patient tolerated treatment well    Behavior During Therapy [facility] for tasks assessed/performed           Past Medical History:  Diagnosis Date  . Chronic LBP    Lumbar DDD  . Colitis, ischemic (HCC)   . Depression   . Endometriosis   . GERD (gastroesophageal reflux disease)   . Hematuria   . Hyperlipidemia   . IBS (irritable bowel syndrome)    Fr Hurrelbrink- on Miralax and Citrucel daily  . Migraines   . MVP (mitral valve prolapse)     Past Surgical History:  Procedure Laterality Date  . ABDOMINAL HYSTERECTOMY  1991   w/ bilat oophorectomy for endometriosis  . APPENDECTOMY    . BREAST BIOPSY    . ESOPHAGOGASTRODUODENOSCOPY  06  . fused 5th digit right hand Right   . HEMILAMINOTOMY LUMBAR SPINE  11/18/1991   Dr. Lorain Childes  . lapartomy  1995  . OTHER SURGICAL HISTORY  1992, 1994  . right breast biopsy- benign    . SPINAL FUSION  08/18/97   Dr. Dorita Fray at [facility], L4-5 post fusion with iliac crest bone graft  . TONSILLECTOMY AND ADENOIDECTOMY      There were no vitals filed for this visit.   Subjective Assessment - 10/19/20 1201    Subjective The patient reports she feels a trembling inside.  She continues with imbalance.   Her balance is "weird" noting her back is painful, and she has vertigo.  No falls in the past 2 weeks.  Her hips are bothering her with some exercise.    Patient Stated Goals be able to walk without fear of falling    Currently in Pain? Yes    Pain Location Back    Effect of Pain on Daily Activities has a spinal cord stimulator x 5 years                             [facility] Adult PT Treatment/Exercise - 10/19/20 1230      Ambulation/Gait   Ambulation/Gait Yes    Ambulation/Gait Assistance 6: Modified independent (Device/Increase time)    Ambulation Distance (Feet) 250 Feet    Assistive device None    Gait Comments Worked on dynamic gait with horizontal and vertical head turns. Forward and backward walking, toe and heel walking with CGA to min A.  Also performed dynamic gait with ball toss      Neuro Re-ed    Neuro Re-ed Details  standing corner balance exercises performing tandem stance, feet together and feet apart with horizontal head motions,  sidestepping, marching and single leg stance dec'ing UE support.      Exercises   Exercises Knee/Hip      Knee/Hip Exercises: Stretches   Hip Flexor Stretch Right;Left;2 reps;30 seconds      Knee/Hip Exercises: Standing   Heel Raises Both;20 reps    Knee Flexion Strengthening;Both;2 sets;10 reps;Other (comment)    Hip Abduction Stengthening;Both;2 sets;10 reps      Knee/Hip Exercises: Seated   Sit to Sand 10 reps   then 5 reps with eyes closed          Vestibular Treatment/Exercise - 10/19/20 1206      Vestibular Treatment/Exercise   Vestibular Treatment Provided Habituation;Gaze    Habituation Exercises Seated Vertical Head Turns;Seated Horizontal Head Turns;Standing Horizontal Head Turns    Gaze Exercises X1 Viewing Horizontal      Seated Horizontal Head Turns   Number of Reps  5      Seated Vertical Head Turns   Number of Reps  5      Standing Horizontal Head Turns   Symptom Description  with increased sway  requiring min A for safety                 PT Education - 10/19/20 1522    Education Details revised HEP to progress    Person(s) Educated Patient    Methods Explanation;Demonstration;Handout    Comprehension Verbalized understanding;Returned demonstration            PT Short Term Goals - 09/29/20 1709      PT SHORT TERM GOAL #1   Title Will be independent with HEP to be updated PRN    Time 3    Period Weeks    Status New    Target Date 10/20/20      PT SHORT TERM GOAL #2   Title Will be able to maintain tandem stance for at least 30 seconds bilaterally without UE support    Time 3    Period Weeks    Status New      PT SHORT TERM GOAL #3   Title Will be able to state at least 4 precautions she can take at home (for example, picking up throw rugs and good lighting) to help prevent falls    Time 3    Period Weeks    Status New             PT Long Term Goals - 09/29/20 1710      PT LONG TERM GOAL #1   Title Will improve MMT by at least 1 grade in order to show improved strenth and assist in reducing fall risk    Time 6    Period Weeks    Status New    Target Date 11/10/20      PT LONG TERM GOAL #2   Title Will score at least 18/24 on DGI to show reduced fall risk    Time 6    Period Weeks    Status New      PT LONG TERM GOAL #3   Title Will be able to maintain SLS for at least 15 seconds bilaterally to show reduced fall risk    Time 6    Period Weeks    Status New      PT LONG TERM GOAL #4   Title Will independently participate in 30 minutes of preferred exercise at least 3 times per week to improve functional activity tolerance and reduce fall risk  Time 6    Period Weeks    Status New                 Plan - 10/19/20 1523    Clinical Impression Statement The patient was able to tolerate progression of HEP with dec'ing UE support.  Plan to continue to progress to tolerance.    PT Treatment/Interventions ADLs/Self Care Home  Management;Aquatic Therapy;Cryotherapy;Electrical Stimulation;Iontophoresis 4mg /ml Dexamethasone;Moist Heat;Ultrasound;DME Instruction;Gait training;Stair training;Functional mobility training;Therapeutic activities;Therapeutic exercise;Balance training;Neuromuscular re-education;Patient/family education;Manual techniques;Energy conservation;Taping;Vestibular    PT Next Visit Plan continue to progress balance, community obstacle negotaition, VOR and work to .    PT Home Exercise Plan [email or web address]           Patient will benefit from skilled therapeutic intervention in order to improve the following deficits and impairments:  Abnormal gait,Decreased balance,Decreased endurance,Decreased mobility,Difficulty walking,Decreased activity tolerance,Impaired flexibility,Decreased strength  Visit Diagnosis: Unsteadiness on feet  Muscle weakness (generalized)  Other abnormalities of gait and mobility     Problem List Patient Active Problem List   Diagnosis Date Noted  . Dysequilibrium 08/10/2020  . 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 Caregiver stress 12/15/2019  . Primary osteoarthritis of both hips 10/08/2019  . Pseudogout of knee 06/10/2019  . Osteopenia 12/04/2018  . Avulsion fracture of right talus 06/27/2017  . Rotator cuff tendonitis, left 05/11/2017  . Hyperlipidemia 11/10/2016  . S/P insertion of spinal cord stimulator 02/23/2016  . Segmental and somatic dysfunction of lumbar region 06/11/2015  . Gluten intolerance 09/23/2013  . Postlaminectomy syndrome, not elsewhere classified 09/03/2013  . Low back pain 03/29/2013  . Lumbosacral spondylosis 03/29/2013  . Spasm of muscle 03/29/2013  . Disorder of sacrum 03/29/2013  . Chronic pain 03/29/2013  . Depressive disorder 03/29/2013  . Constipation 03/29/2013  . Anxiety state 03/29/2013  . History of mallet deformity correction, possible osteomyelitis 03/20/2013  . Insomnia 03/18/2013  . OSTEOARTHRITIS, HIP, RIGHT 02/14/2010  .  Osteoarthritis 02/14/2010  . Anemia 06/02/2009  . 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 IRRITABLE BOWEL SYNDROME 06/02/2008  . Generalized weakness 06/02/2008  . Malaise and fatigue 06/02/2008  . GERD 03/30/2008  . DIVERTICULOSIS OF COLON 03/30/2008  . Major depression, recurrent, chronic (HCC) 02/17/2008  . ENDOMETRIOSIS 02/17/2008  . DISC DISEASE, LUMBAR 02/17/2008  . ISCHEMIC COLITIS, HX OF 02/17/2008    Zyaire Mccleod, PT 10/19/2020, 3:29 PM  [facility] [address] [place], [place], [place] Phone: [phone]   Fax:  [phone]  Name: Carolyn Hendrix MRN: [name] Date of Birth: [birth year]

## 2020-10-19 NOTE — Patient Instructions (Signed)
Access Code: [email or web address] URL: [email or web address] Date: 10/19/2020 Prepared by: Margretta Ditty  Exercises Single Leg Stance with Support - 2 x daily - 7 x weekly - 1 sets - 3 reps - 5-15 hold Sit to Stand - 1 x daily - 7 x weekly - 2 sets - 10 reps Standing Heel Raise - 2 x daily - 7 x weekly - 1 sets - 20 reps Tandem Stance - 2 x daily - 7 x weekly - 1 sets - 3 reps Standing with Head Rotation - 2 x daily - 7 x weekly - 1 sets - 10 reps Supine Bridge - 2 x daily - 7 x weekly - 1 sets - 15 reps Thomas Stretch on Table - 2 x daily - 7 x weekly - 1 sets - 2 reps - 30 seconds hold

## 2020-10-21 ENCOUNTER — Other Ambulatory Visit: Payer: Self-pay

## 2020-10-21 ENCOUNTER — Ambulatory Visit: Payer: Medicare Other | Admitting: Rehabilitative and Restorative Service Providers"

## 2020-10-21 DIAGNOSIS — R2689 Other abnormalities of gait and mobility: Secondary | ICD-10-CM

## 2020-10-21 DIAGNOSIS — M6281 Muscle weakness (generalized): Secondary | ICD-10-CM

## 2020-10-21 DIAGNOSIS — R2681 Unsteadiness on feet: Secondary | ICD-10-CM

## 2020-10-21 NOTE — Therapy (Addendum)
[place], [place], [address] Phone: [phone]   Fax:  [phone]  Physical Therapy Treatment and Discharge  Patient Details  Name: Carolyn Hendrix MRN: [id number] Date of Birth: 24-May-1941 Referring Provider (PT): Beatrice Lecher    Encounter Date: 10/21/2020   PT End of Session - 10/21/20 1204    Visit Number 4    Number of Visits 12    Date for PT Re-Evaluation 11/10/20    Authorization Type UHC Medicare    Authorization Time Period 09/29/20 to 11/10/20    Authorization - Visit Number 4    Authorization - Number of Visits 12    Progress Note Due on Visit 10    PT Start Time 1150    PT Stop Time 1230    PT Time Calculation (min) 40 min    Activity Tolerance Patient tolerated treatment well    Behavior During Therapy [facility] for tasks assessed/performed           Past Medical History:  Diagnosis Date  . Chronic LBP    Lumbar DDD  . Colitis, ischemic ([place])   . Depression   . Endometriosis   . GERD (gastroesophageal reflux disease)   . Hematuria   . Hyperlipidemia   . IBS (irritable bowel syndrome)    Fr Carolyn Hendrix- on Miralax and Citrucel daily  . Migraines   . MVP (mitral valve prolapse)     Past Surgical History:  Procedure Laterality Date  . ABDOMINAL HYSTERECTOMY  1991   w/ bilat oophorectomy for endometriosis  . APPENDECTOMY    . BREAST BIOPSY    . ESOPHAGOGASTRODUODENOSCOPY  06  . fused 5th digit right hand Right   . HEMILAMINOTOMY LUMBAR SPINE  11/18/1991   Dr. Billey Chang  . lapartomy  1995  . OTHER SURGICAL HISTORY  1992, 1994  . right breast biopsy- benign    . SPINAL FUSION  08/18/97   Dr. Rowe Pavy at [facility], L4-5 post fusion with iliac crest bone graft  . TONSILLECTOMY AND ADENOIDECTOMY      There were no vitals filed for this visit.   Subjective Assessment - 10/21/20 1151    Subjective The patinet reports she continues with trembling sensation.  She  was sore in her low back and quads after last visit.    Patient Stated Goals be able to walk without fear of falling    Currently in Pain? Yes    Pain Score 5     Pain Location Back    Pain Orientation Lower    Pain Descriptors / Indicators Tightness;Sore    Pain Type Chronic pain    Pain Onset More than a month ago    Pain Frequency Intermittent    Aggravating Factors  standing in one position    Pain Relieving Factors rest              [facility] PT Assessment - 10/21/20 1205      Assessment   Medical Diagnosis dysequilibrium, vertigo     Referring Provider (PT) Beatrice Lecher     Hand Dominance Right                         [facility] Adult PT Treatment/Exercise - 10/21/20 1205      Ambulation/Gait   Ambulation/Gait Yes    Ambulation/Gait Assistance 6: Modified independent (Device/Increase time)    Ambulation Distance (Feet) 250 Feet   x 3 reps  Assistive device None    Stairs Yes    Stairs Assistance 6: Modified independent (Device/Increase time)    Stair Management Technique Alternating pattern    Number of Stairs 12    Gait Comments Worked on dynamic gait with horizontal and vertical head turns. Forward and backward walking, toe and heel walking with CGA to min A.  Also performed dynamic gait with ball toss      Therapeutic Activites    Therapeutic Activities Other Therapeutic Activities    Other Therapeutic Activities The patient reports she is unable to bridge on bed due to compliant surface.  Worked on floor<>stand transfer and bridges on the floor (on yoga mat).      Neuro Re-ed    Neuro Re-ed Details  Rocker board standing performing rocker board with head turns *this irritates lateral hips bilaterally, therefore modified.  Foam standing with head motions, lateral step downs and alternating posterior step downs x 10 reps with CGA.  Standing activities reaching to touch cones, working on turns and functional sit<>stand activities.      Exercises    Exercises Knee/Hip      Knee/Hip Exercises: Standing   Heel Raises Both;20 reps    Hip Abduction Stengthening;Both;2 sets;10 reps    Abduction Limitations sidestepping                    PT Short Term Goals - 09/29/20 1709      PT SHORT TERM GOAL #1   Title Will be independent with HEP to be updated PRN    Time 3    Period Weeks    Status New    Target Date 10/20/20      PT SHORT TERM GOAL #2   Title Will be able to maintain tandem stance for at least 30 seconds bilaterally without UE support    Time 3    Period Weeks    Status New      PT SHORT TERM GOAL #3   Title Will be able to state at least 4 precautions she can take at home (for example, picking up throw rugs and good lighting) to help prevent falls    Time 3    Period Weeks    Status New             PT Long Term Goals - 09/29/20 1710      PT LONG TERM GOAL #1   Title Will improve MMT by at least 1 grade in order to show improved strenth and assist in reducing fall risk    Time 6    Period Weeks    Status New    Target Date 11/10/20      PT LONG TERM GOAL #2   Title Will score at least 18/24 on DGI to show reduced fall risk    Time 6    Period Weeks    Status New      PT LONG TERM GOAL #3   Title Will be able to maintain SLS for at least 15 seconds bilaterally to show reduced fall risk    Time 6    Period Weeks    Status New      PT LONG TERM GOAL #4   Title Will independently participate in 30 minutes of preferred exercise at least 3 times per week to improve functional activity tolerance and reduce fall risk    Time 6    Period Weeks    Status New  Plan - 10/21/20 1234    Clinical Impression Statement The patient improved with "trembling" sensation with mobility in therapy.  PT to continue to work towards [place] focusing on dynamic balance and functional strength.  Modify activities as needed to avoid hip and LBP.    PT Treatment/Interventions ADLs/Self Care Home  Management;Aquatic Therapy;Cryotherapy;Electrical Stimulation;Iontophoresis 67m/ml Dexamethasone;Moist Heat;Ultrasound;DME Instruction;Gait training;Stair training;Functional mobility training;Therapeutic activities;Therapeutic exercise;Balance training;Neuromuscular re-education;Patient/family education;Manual techniques;Energy conservation;Taping;Vestibular    PT Next Visit Plan continue to progress balance, community obstacle negotaition, VOR and work to [employer]    PT Home Exercise Plan [email or web address]           Patient will benefit from skilled therapeutic intervention in order to improve the following deficits and impairments:  Abnormal gait,Decreased balance,Decreased endurance,Decreased mobility,Difficulty walking,Decreased activity tolerance,Impaired flexibility,Decreased strength  Visit Diagnosis: Unsteadiness on feet  Muscle weakness (generalized)  Other abnormalities of gait and mobility     Problem List Patient Active Problem List   Diagnosis Date Noted  . Dysequilibrium 08/10/2020  . 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 Caregiver stress 12/15/2019  . Primary osteoarthritis of both hips 10/08/2019  . Pseudogout of knee 06/10/2019  . Osteopenia 12/04/2018  . Avulsion fracture of right talus 06/27/2017  . Rotator cuff tendonitis, left 05/11/2017  . Hyperlipidemia 11/10/2016  . S/P insertion of spinal cord stimulator 02/23/2016  . Segmental and somatic dysfunction of lumbar region 06/11/2015  . Gluten intolerance 09/23/2013  . Postlaminectomy syndrome, not elsewhere classified 09/03/2013  . Low back pain 03/29/2013  . Lumbosacral spondylosis 03/29/2013  . Spasm of muscle 03/29/2013  . Disorder of sacrum 03/29/2013  . Chronic pain 03/29/2013  . Depressive disorder 03/29/2013  . Constipation 03/29/2013  . Anxiety state 03/29/2013  . History of mallet deformity correction, possible osteomyelitis 03/20/2013  . Insomnia 03/18/2013  . OSTEOARTHRITIS, HIP, RIGHT 02/14/2010  .  Osteoarthritis 02/14/2010  . Anemia 06/02/2009  . 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 IRRITABLE BOWEL SYNDROME 06/02/2008  . Generalized weakness 06/02/2008  . Malaise and fatigue 06/02/2008  . GERD 03/30/2008  . DIVERTICULOSIS OF COLON 03/30/2008  . Major depression, recurrent, chronic ([place] 02/17/2008  . ENDOMETRIOSIS 02/17/2008  . DMahometDISEASE, LUMBAR 02/17/2008  . ISCHEMIC COLITIS, HX OF 02/17/2008   PHYSICAL THERAPY DISCHARGE SUMMARY  Visits from Start of Care: 4  Current functional level related to goals / functional outcomes: Goals not assessed due to pt not completing PT POC   Remaining deficits: Balance, strength, endurance   Education / Equipment: HEP Plan: Patient agrees to discharge.  Patient goals were not met. Patient is being discharged due to not returning since the last visit.  ?????    DElberta Leatherwood PT 10/21/2020, 12:35 PM  [facility] [address]Phone: [phone]  Fax:  [phone] Name: Carolyn DRAKEFORDMRN: [id number] of Birth: 908-Oct-1942

## 2020-10-26 ENCOUNTER — Encounter: Payer: Medicare Other | Admitting: Rehabilitative and Restorative Service Providers"

## 2020-10-28 ENCOUNTER — Other Ambulatory Visit: Payer: Self-pay | Admitting: Family Medicine

## 2020-10-29 ENCOUNTER — Encounter: Payer: Medicare Other | Admitting: Rehabilitative and Restorative Service Providers"

## 2020-11-15 ENCOUNTER — Other Ambulatory Visit: Payer: Self-pay | Admitting: Adult Health

## 2020-11-26 ENCOUNTER — Other Ambulatory Visit: Payer: Self-pay | Admitting: Family Medicine

## 2020-11-29 ENCOUNTER — Ambulatory Visit (INDEPENDENT_AMBULATORY_CARE_PROVIDER_SITE_OTHER): Payer: Medicare Other | Admitting: Family Medicine

## 2020-11-29 ENCOUNTER — Other Ambulatory Visit: Payer: Self-pay | Admitting: General Practice

## 2020-11-29 DIAGNOSIS — Z0000 Encounter for general adult medical examination without abnormal findings: Secondary | ICD-10-CM | POA: Diagnosis not present

## 2020-11-29 DIAGNOSIS — Z13820 Encounter for screening for osteoporosis: Secondary | ICD-10-CM

## 2020-11-29 NOTE — Patient Instructions (Addendum)
Bone Density Test A bone density test uses a type of X-ray to measure the amount of calcium and other minerals in a person's bones. It can measure bone density in the hip and the spine. The test is similar to having a regular X-ray. This test may also be called:  Bone densitometry.  Bone mineral density test.  Dual-energy X-ray absorptiometry (DEXA). You may have this test to:  Diagnose a condition that causes weak or thin bones (osteoporosis).  Screen you for osteoporosis.  Predict your risk for a broken bone (fracture).  Determine how well your osteoporosis treatment is working. Tell a health care provider about:  Any allergies you have.  All medicines you are taking, including vitamins, herbs, eye drops, creams, and over-the-counter medicines.  Any problems you or family members have had with anesthetic medicines.  Any blood disorders you have.  Any surgeries you have had.  Any medical conditions you have.  Whether you are pregnant or may be pregnant.  Any medical tests you have had within the past 14 days that used contrast material. What are the risks? Generally, this is a safe test. However, it does expose you to a small amount of radiation, which can slightly increase your cancer risk. What happens before the test?  Do not take any calcium supplements within the 24 hours before your test.  You will need to remove all metal jewelry, eyeglasses, removable dental appliances, and any other metal objects on your body. What happens during the test?  You will lie down on an exam table. There will be an X-ray generator below you and an imaging device above you.  Other devices, such as boxes or braces, may be used to position your body properly for the scan.  The machine will slowly scan your body. You will need to keep very still while the machine does the scan.  The images will show up on a screen in the room. Images will be examined by a specialist after your  test is finished. The procedure may vary among health care providers and hospitals.   What can I expect after the test? It is up to you to get the results of your test. Ask your health care provider, or the department that is doing the test, when your results will be ready. Summary  A bone density test is an imaging test that uses a type of X-ray to measure the amount of calcium and other minerals in your bones.  The test may be used to diagnose or screen you for a condition that causes weak or thin bones (osteoporosis), predict your risk for a broken bone (fracture), or determine how well your osteoporosis treatment is working.  Do not take any calcium supplements within 24 hours before your test.  Ask your health care provider, or the department that is doing the test, when your results will be ready. This information is not intended to replace advice given to you by your health care provider. Make sure you discuss any questions you have with your health care provider. Document Revised: 03/25/2020 Document Reviewed: 03/25/2020 Elsevier Patient Education  [place] Maintenance, Female Adopting a healthy lifestyle and getting preventive care are important in promoting health and wellness. Ask your health care provider about:  The right schedule for you to have regular tests and exams.  Things you can do on your own to prevent diseases and keep yourself healthy. What should I know about diet, weight, and exercise?  Eat a healthy diet  Eat a diet that includes plenty of vegetables, fruits, low-fat dairy products, and lean protein.  Do not eat a lot of foods that are high in solid fats, added sugars, or sodium.   Maintain a healthy weight Body mass index (BMI) is used to identify weight problems. It estimates body fat based on height and weight. Your health care provider can help determine your BMI and help you achieve or maintain a healthy weight. Get regular  exercise Get regular exercise. This is one of the most important things you can do for your health. Most adults should:  Exercise for at least 150 minutes each week. The exercise should increase your heart rate and make you sweat (moderate-intensity exercise).  Do strengthening exercises at least twice a week. This is in addition to the moderate-intensity exercise.  Spend less time sitting. Even light physical activity can be beneficial. Watch cholesterol and blood lipids Have your blood tested for lipids and cholesterol at 78 years of age, then have this test every 5 years. Have your cholesterol levels checked more often if:  Your lipid or cholesterol levels are high.  You are older than 78 years of age.  You are at high risk for heart disease. What should I know about cancer screening? Depending on your health history and family history, you may need to have cancer screening at various ages. This may include screening for:  Breast cancer.  Cervical cancer.  Colorectal cancer.  Skin cancer.  Lung cancer. What should I know about heart disease, diabetes, and high blood pressure? Blood pressure and heart disease  High blood pressure causes heart disease and increases the risk of stroke. This is more likely to develop in people who have high blood pressure readings, are of African descent, or are overweight.  Have your blood pressure checked: ? Every 3-5 years if you are 2-3 years of age. ? Every year if you are 12 years old or older. Diabetes Have regular diabetes screenings. This checks your fasting blood sugar level. Have the screening done:  Once every three years after age 19 if you are at a normal weight and have a low risk for diabetes.  More often and at a younger age if you are overweight or have a high risk for diabetes. What should I know about preventing infection? Hepatitis B If you have a higher risk for hepatitis B, you should be screened for this virus.  Talk with your health care provider to find out if you are at risk for hepatitis B infection. Hepatitis C Testing is recommended for:  Everyone born from [birth year] through 1963.  Anyone with known risk factors for hepatitis C. Sexually transmitted infections (STIs)  Get screened for STIs, including gonorrhea and chlamydia, if: ? You are sexually active and are younger than 78 years of age. ? You are older than 78 years of age and your health care provider tells you that you are at risk for this type of infection. ? Your sexual activity has changed since you were last screened, and you are at increased risk for chlamydia or gonorrhea. Ask your health care provider if you are at risk.  Ask your health care provider about whether you are at high risk for HIV. Your health care provider may recommend a prescription medicine to help prevent HIV infection. If you choose to take medicine to prevent HIV, you should first get tested for HIV. You should then be tested every 3 months for  as long as you are taking the medicine. Pregnancy  If you are about to stop having your period (premenopausal) and you may become pregnant, seek counseling before you get pregnant.  Take 400 to 800 micrograms (mcg) of folic acid every day if you become pregnant.  Ask for birth control (contraception) if you want to prevent pregnancy. Osteoporosis and menopause Osteoporosis is a disease in which the bones lose minerals and strength with aging. This can result in bone fractures. If you are 29 years old or older, or if you are at risk for osteoporosis and fractures, ask your health care provider if you should:  Be screened for bone loss.  Take a calcium or vitamin D supplement to lower your risk of fractures.  Be given hormone replacement therapy (HRT) to treat symptoms of menopause. Follow these instructions at home: Lifestyle  Do not use any products that contain nicotine or tobacco, such as cigarettes, e-cigarettes,  and chewing tobacco. If you need help quitting, ask your health care provider.  Do not use street drugs.  Do not share needles.  Ask your health care provider for help if you need support or information about quitting drugs. Alcohol use  Do not drink alcohol if: ? Your health care provider tells you not to drink. ? You are pregnant, may be pregnant, or are planning to become pregnant.  If you drink alcohol: ? Limit how much you use to 0-1 drink a day. ? Limit intake if you are breastfeeding.  Be aware of how much alcohol is in your drink. In the [place] one drink equals one 12 oz bottle of beer (355 mL), one 5 oz glass of wine (148 mL), or one 1 oz glass of hard liquor (44 mL). General instructions  Schedule regular health, dental, and eye exams.  Stay current with your vaccines.  Tell your health care provider if: ? You often feel depressed. ? You have ever been abused or do not feel safe at home. Summary  Adopting a healthy lifestyle and getting preventive care are important in promoting health and wellness.  Follow your health care provider's instructions about healthy diet, exercising, and getting tested or screened for diseases.  Follow your health care provider's instructions on monitoring your cholesterol and blood pressure. This information is not intended to replace advice given to you by your health care provider. Make sure you discuss any questions you have with your health care provider. Document Revised: 10/02/2018 Document Reviewed: 10/02/2018 Elsevier Patient Education  2021 [employer] Inc[employer]    MEDICARE [place] VISIT Health Maintenance Summary and Written Plan of Care  [name] ,  Thank you for allowing me to perform your Medicare Annual Wellness Visit and for your ongoing commitment to your health.   Health Maintenance & Immunization History Health Maintenance  Topic Date Due  . Hepatitis C Screening  11/29/2021 (Originally 08/22/1941)  .  TETANUS/TDAP  12/18/2026  . INFLUENZA VACCINE  Completed  . DEXA SCAN  Completed  . COVID-19 Vaccine  Completed  . PNA vac Low Risk Adult  Completed   Immunization History  Administered Date(s) Administered  . Fluad Quad(high Dose 65+) 06/13/2019, 08/10/2020  . Influenza Split 08/30/2011, 08/07/2012  . Influenza Whole 08/19/2008, 08/03/2010  . Influenza, Seasonal, Injecte, Preservative Fre 07/22/2013  . Influenza,inj,Quad PF,6+ Mos 09/23/2014, 06/25/2015, 09/08/2016  . Influenza-Unspecified 06/27/2017, 08/11/2018  . PFIZER(Purple Top)SARS-COV-2 Vaccination 01/01/2020, 01/22/2020, 08/24/2020  . Pneumococcal Conjugate-13 09/23/2014  . Pneumococcal Polysaccharide-23 06/02/2009  . Td 05/16/2006  .  Tdap 12/18/2016  . Zoster 05/30/2011  . Zoster Recombinat (Shingrix) 11/22/2018, 05/14/2019    These are the patient goals that we discussed: Goals Addressed              This Visit's Progress   .  Patient Stated (pt-stated)        11/29/2020 AWV Goal: Exercise for General Health   Patient will verbalize understanding of the benefits of increased physical activity:  Exercising regularly is important. It will improve your overall fitness, flexibility, and endurance.  Regular exercise also will improve your overall health. It can help you control your weight, reduce stress, and improve your bone density.  Over the next year, patient will increase physical activity as tolerated with a goal of at least 150 minutes of moderate physical activity per week.   You can tell that you are exercising at a moderate intensity if your heart starts beating faster and you start breathing faster but can still hold a conversation.  Moderate-intensity exercise ideas include:  Walking 1 mile (1.6 km) in about 15 minutes  Biking  Hiking  Golfing  Dancing  Water aerobics  Patient will verbalize understanding of everyday activities that increase physical activity by providing examples like the  following: ? Yard work, such as: ? Pushing a Surveyor, mining ? Raking and bagging leaves ? Washing your car ? Pushing a stroller ? Shoveling snow ? Gardening ? Washing windows or floors  Patient will be able to explain general safety guidelines for exercising:   Before you start a new exercise program, talk with your health care provider.  Do not exercise so much that you hurt yourself, feel dizzy, or get very short of breath.  Wear comfortable clothes and wear shoes with good support.  Drink plenty of water while you exercise to prevent dehydration or heat stroke.  Work out until your breathing and your heartbeat get faster.         This is a list of Health Maintenance Items that are overdue or due now: Bone densitometry screening  Orders/Referrals Placed Today: Dexa scan referral will be sent and they will give you a call to schedule your appointment.   Follow-up Plan . Follow-up with Agapito Games, MD as planned

## 2020-11-29 NOTE — Progress Notes (Signed)
MEDICARE ANNUAL WELLNESS VISIT  11/29/2020  Telephone Visit Disclaimer This Medicare AWV was conducted by telephone due to national recommendations for restrictions regarding the COVID-19 Pandemic (e.g. social distancing).  I verified, using two identifiers, that I am speaking with Carolyn Hendrix or their authorized healthcare agent. I discussed the limitations, risks, security, and privacy concerns of performing an evaluation and management service by telephone and the potential availability of an in-person appointment in the future. The patient expressed understanding and agreed to proceed.  Location of Patient: Home Location of Provider (nurse): In the office  Subjective:    Carolyn Hendrix is a 80 y.o. female patient of Metheney, Rene Kocher, MD who had a Medicare Annual Wellness Visit today via telephone. Carolyn Hendrix is Retired and lives alone. she has 2 children. she reports that she is socially active and does interact with friends/family regularly. she is minimally physically active and enjoys puzzles and word search on her tablet.  Patient Care Team: Hali Marry, MD as PCP - General (Family Medicine) Linward Natal, MD as Referring Physician (Obstetrics and Gynecology) Cleon Gustin, MD as Consulting Physician (Anesthesiology) Vernie Ammons, MD as Referring Physician (Dermatology)  Advanced Directives 11/29/2020 11/25/2019 11/19/2018 05/23/2017 09/11/2016 09/23/2014  Does Patient Have a Medical Advance Directive? _0  Yes  Type of Paramedic of [place];Living will [place];Living will [place];Living will [place];Living will [place];Living will Living will  Does patient want to make changes to medical advance directive? No - Patient declined No - Patient declined No - Patient declined - No - Patient declined -  Copy of [place] in  Chart? No - copy requested No - copy requested No - copy requested Yes Yes No - copy requested    Hospital Utilization Over the Past 12 Months: # of hospitalizations or ER visits: 0 # of surgeries: 0  Review of Systems    Patient reports that her overall health is worse compared to last year.  History obtained from chart review and the patient  Patient Reported Readings (BP, Pulse, CBG, Weight, etc) none  Pain Assessment Pain : 0-10 Pain Score: 4  Pain Type: Chronic pain Pain Location: Back Pain Orientation: Lower Pain Descriptors / Indicators: Aching Pain Onset: More than a month ago Pain Frequency: Intermittent Pain Relieving Factors: Ultram and nerve stimulator  Pain Relieving Factors: Ultram and nerve stimulator  Current Medications & Allergies (verified) Allergies as of 11/29/2020      Reactions   Cymbalta [duloxetine Hcl] Diarrhea   Morphine Hives      Medication List       Accurate as of November 29, 2020 11:56 AM. If you have any questions, ask your nurse or doctor.        acyclovir ointment 5 % Commonly known as: ZOVIRAX Apply 1 application topically every 3 (three) hours.   ALPRAZolam 0.25 MG tablet Commonly known as: XANAX TAKE 1 TABLET BY MOUTH TWICE A DAY AS NEEDED FOR ANXIETY   baclofen 10 MG tablet Commonly known as: LIORESAL TAKE 1 TABLET BY MOUTH AT BEDTIME AS NEEDED FOR MUSCLE SPASMS.   buPROPion 300 MG 24 hr tablet Commonly known as: WELLBUTRIN XL Take 1 tablet (300 mg total) by mouth daily.   busPIRone 10 MG tablet Commonly known as: BUSPAR Take two tablets twice daily.   dicyclomine 10 MG capsule Commonly known as: BENTYL TAKE ONE CAPSULE 3 TIMES A  DAY AS NEEDED SPASMS   escitalopram 20 MG tablet Commonly known as: LEXAPRO TAKE 1 TABLET BY MOUTH EVERY DAY   glucosamine-chondroitin 500-400 MG tablet Take 1 tablet by mouth 2 (two) times daily.   Kava Kava 200 MG Caps See admin instructions.   Magnesium 300 MG Caps 1 capsule  with a meal   naloxone 4 MG/0.1ML Liqd nasal spray kit Commonly known as: NARCAN Place into the nose.   omeprazole 20 MG capsule Commonly known as: PRILOSEC TAKE 1 CAPSULE BY MOUTH EVERY DAY   pravastatin 40 MG tablet Commonly known as: PRAVACHOL TAKE 1 TABLET BY MOUTH EVERY DAY What changed:   how much to take  how to take this  when to take this  additional instructions   PROBIOTIC-10 PO See admin instructions.   pseudoephedrine 60 MG tablet Commonly known as: SUDAFED 1 tablet as needed   Sentry Tabs See admin instructions.   traMADol 50 MG tablet Commonly known as: ULTRAM TAKE 1 TO 2 TABLETS BY MOUTH 3 TIMES A DAY AS NEEDED FOR PAIN   traZODone 100 MG tablet Commonly known as: DESYREL TAKE 1 TABLET BY MOUTH EVERYDAY AT BEDTIME   TURMERIC PO Take by mouth daily.   Vitamin D3 25 MCG (1000 UT) Caps 1 capsule       History (reviewed): Past Medical History:  Diagnosis Date  . Chronic LBP    Lumbar DDD  . Colitis, ischemic ([place])   . Depression   . Endometriosis   . GERD (gastroesophageal reflux disease)   . Hematuria   . Hyperlipidemia   . IBS (irritable bowel syndrome)    Fr Hurrelbrink- on Miralax and Citrucel daily  . Migraines   . MVP (mitral valve prolapse)    Past Surgical History:  Procedure Laterality Date  . ABDOMINAL HYSTERECTOMY  1991   w/ bilat oophorectomy for endometriosis  . APPENDECTOMY    . BREAST BIOPSY    . ESOPHAGOGASTRODUODENOSCOPY  06  . fused 5th digit right hand Right   . HEMILAMINOTOMY LUMBAR SPINE  11/18/1991   Dr. Billey Chang  . lapartomy  1995  . OTHER SURGICAL HISTORY  1992, 1994  . right breast biopsy- benign    . SPINAL FUSION  08/18/97   Dr. Rowe Pavy at [facility], L4-5 post fusion with iliac crest bone graft  . TONSILLECTOMY AND ADENOIDECTOMY     Family History  Problem Relation Age of Onset  . Alzheimer's disease Mother   . Other Father        ACS? - 36's  . Ataxia Sister    Social History    Socioeconomic History  . Marital status: Widowed    Spouse name: Gwyndolyn Saxon  . Number of children: 2  . Years of education: 15  . Highest education level: 12th grade  Occupational History  . 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 Smoking status: Former Smoker    Types: Cigarettes    Quit date: 10/23/1980    Years since quitting: 40.1  . Smokeless tobacco: Never Used  Vaping Use  . Vaping Use: Never used  Substance and Sexual Activity  . Alcohol use: No  . Drug use: No  . Sexual activity: Not Currently  Other Topics Concern  . Not on file  Social History Narrative   Her husband passed away in 11/04/2020. She is getting better at taking care of herself as she had to take care of  her husband for the past 2.5 years. She lives alone with her dog.    Social Determinants of Health   Financial Resource Strain: Low Risk   . Difficulty of Paying Living Expenses: Not hard at all  Food Insecurity: No Food Insecurity  . Worried About Charity fundraiser in the Last Year: Never true  . Ran Out of Food in the Last Year: Never true  Transportation Needs: No Transportation Needs  . Lack of Transportation (Medical): No  . Lack of Transportation (Non-Medical): No  Physical Activity: Inactive  . Days of Exercise per Week: 0 days  . Minutes of Exercise per Session: 0 min  Stress: No Stress Concern Present  . Feeling of Stress : Not at all  Social Connections: Moderately Isolated  . Frequency of Communication with Friends and Family: More than three times a week  . Frequency of Social Gatherings with Friends and Family: Never  . Attends Religious Services: 1 to 4 times per year  . Active Member of Clubs or Organizations: No  . Attends Archivist Meetings: Never  . Marital Status: Widowed    Activities of Daily Living In your present state of health, do you have any difficulty performing the following activities: 11/29/2020   Hearing? N  Vision? N  Difficulty concentrating or making decisions? N  Walking or climbing stairs? N  Dressing or bathing? N  Doing errands, shopping? N  Preparing Food and eating ? N  Using the Toilet? N  In the past six months, have you accidently leaked urine? N  Do you have problems with loss of bowel control? N  Managing your Medications? N  Managing your Finances? N  Housekeeping or managing your Housekeeping? N  Some recent data might be hidden    Patient Education/ Literacy What is the last grade level you completed in school?: 12th  Exercise Current Exercise Habits: The patient does not participate in regular exercise at present;Home exercise routine, Type of exercise: stretching, Time (Minutes): 20, Frequency (Times/Week): 4, Weekly Exercise (Minutes/Week): 80, Intensity: Mild, Exercise limited by: orthopedic condition(s)  Diet Patient reports consuming 2 meals a day and 2 snack(s) a day Patient reports that her primary diet is: Regular Patient reports that she does have regular access to food.   Depression Screen PHQ 2/9 Scores 11/29/2020 06/21/2020 04/14/2020 12/15/2019 12/15/2019 11/25/2019 06/13/2019  PHQ - 2 Score _0 PHQ- 9 Score _1 - - 6  Exception Documentation - - - - - Medical reason -     Fall Risk Fall Risk  11/29/2020 11/25/2019 06/13/2019 11/19/2018 08/13/2018  Falls in the past year? 0 0 0 0 No  Number falls in past yr: 0 - 0 - -  Injury with Fall? 0 - 0 - -  Risk for fall due to : Impaired balance/gait No Fall Risks - Other (Comment) -  Risk for fall due to: Comment - - - taking care of husband at home who had a stroke -  Follow up Education provided;Falls evaluation completed;Falls prevention discussed Falls prevention discussed - Falls prevention discussed -     Objective:  Carolyn Hendrix seemed alert and oriented and she participated appropriately during our telephone visit.  Blood Pressure Weight BMI  BP Readings from Last 3  Encounters:  08/10/20 131/65  12/15/19 (!) 129/59  11/25/19 128/69   Wt Readings from Last 3 Encounters:  08/10/20 157 lb (71.2 kg)  06/21/20 150 lb (  68 kg)  12/15/19 151 lb (68.5 kg)   BMI Readings from Last 1 Encounters:  08/10/20 25.34 kg/m    *Unable to obtain current vital signs, weight, and BMI due to telephone visit type  Hearing/Vision  . Katti did not seem to have difficulty with hearing/understanding during the telephone conversation . Reports that she has had a formal eye exam by an eye care professional within the past year . Reports that she has not had a formal hearing evaluation within the past year *Unable to fully assess hearing and vision during telephone visit type  Cognitive Function: 6CIT Screen 11/29/2020 11/25/2019 11/19/2018 11/10/2016  What Year? 0 points 0 points 0 points 0 points  What month? 0 points 0 points 0 points 0 points  What time? 0 points 0 points 0 points 0 points  Count back from 20 0 points 0 points 0 points 0 points  Months in reverse 0 points 0 points 0 points 0 points  Repeat phrase 0 points 0 points 0 points 0 points  Total Score 0 0 0 0   (Normal:0-7, Significant for Dysfunction: >8)  Normal Cognitive Function Screening: Yes   Immunization & Health Maintenance Record Immunization History  Administered Date(s) Administered  . Fluad Quad(high Dose 65+) 06/13/2019, 08/10/2020  . Influenza Split 08/30/2011, 08/07/2012  . Influenza Whole 08/19/2008, 08/03/2010  . Influenza, Seasonal, Injecte, Preservative Fre 07/22/2013  . Influenza,inj,Quad PF,6+ Mos 09/23/2014, 06/25/2015, 09/08/2016  . Influenza-Unspecified 06/27/2017, 08/11/2018  . PFIZER(Purple Top)SARS-COV-2 Vaccination 01/01/2020, 01/22/2020, 08/24/2020  . Pneumococcal Conjugate-13 09/23/2014  . Pneumococcal Polysaccharide-23 06/02/2009  . Td 05/16/2006  . Tdap 12/18/2016  . Zoster 05/30/2011  . Zoster Recombinat (Shingrix) 11/22/2018, 05/14/2019    Health Maintenance   Topic Date Due  . Hepatitis C Screening  11/29/2021 (Originally [birth year])  . TETANUS/TDAP  12/18/2026  . INFLUENZA VACCINE  Completed  . DEXA SCAN  Completed  . COVID-19 Vaccine  Completed  . PNA vac Low Risk Adult  Completed       Assessment  This is a routine wellness examination for Carolyn Hendrix.  Health Maintenance: Due or Overdue There are no preventive care reminders to display for this patient.  Carolyn Hendrix does not need a referral for Community Assistance: Care Management:   no Social Work:    no Prescription Assistance:  no Nutrition/Diabetes Education:  no   Plan:  Personalized Goals Goals Addressed              This Visit's Progress   .  Patient Stated (pt-stated)        11/29/2020 AWV Goal: Exercise for General Health   Patient will verbalize understanding of the benefits of increased physical activity:  Exercising regularly is important. It will improve your overall fitness, flexibility, and endurance.  Regular exercise also will improve your overall health. It can help you control your weight, reduce stress, and improve your bone density.  Over the next year, patient will increase physical activity as tolerated with a goal of at least 150 minutes of moderate physical activity per week.   You can tell that you are exercising at a moderate intensity if your heart starts beating faster and you start breathing faster but can still hold a conversation.  Moderate-intensity exercise ideas include:  Walking 1 mile (1.6 km) in about 15 minutes  Biking  Hiking  Golfing  Dancing  Water aerobics  Patient will verbalize understanding of everyday activities that increase physical activity by providing examples like the following: ?  [place] work, such as: ? Pushing a Conservation officer, nature ? Raking and bagging leaves ? Washing your car ? Pushing a stroller ? Shoveling snow ? Gardening ? Washing windows or floors  Patient will be able to explain general  safety guidelines for exercising:   Before you start a new exercise program, talk with your health care provider.  Do not exercise so much that you hurt yourself, feel dizzy, or get very short of breath.  Wear comfortable clothes and wear shoes with good support.  Drink plenty of water while you exercise to prevent dehydration or heat stroke.  Work out until your breathing and your heartbeat get faster.       Personalized Health Maintenance & Screening Recommendations  Bone densitometry screening  Lung Cancer Screening Recommended: no (Low Dose CT Chest recommended if Age 79-80 years, 30 pack-year currently smoking OR have quit w/in past 15 years) Hepatitis C Screening recommended: yes HIV Screening recommended: no  Advanced Directives: Written information was not prepared per patient's request.  Referrals & Orders Dexa scan referral will be sent and they will give you a call to schedule your appointment.  Follow-up Plan . Follow-up with Hali Marry, MD as planned    I have personally reviewed and noted the following in the patient's chart:   . Medical and social history . Use of alcohol, tobacco or illicit drugs  . Current medications and supplements . Functional ability and status . Nutritional status . Physical activity . Advanced directives . List of other physicians . Hospitalizations, surgeries, and ER visits in previous 12 months . Vitals . Screenings to include cognitive, depression, and falls . Referrals and appointments  In addition, I have reviewed and discussed with Carolyn Hendrix certain preventive protocols, quality metrics, and best practice recommendations. A written personalized care plan for preventive services as well as general preventive health recommendations is available and can be mailed to the patient at her request.      Tinnie Gens  11/29/2020

## 2020-12-10 ENCOUNTER — Other Ambulatory Visit: Payer: Self-pay | Admitting: Family Medicine

## 2020-12-15 ENCOUNTER — Other Ambulatory Visit: Payer: Medicare Other

## 2020-12-15 DIAGNOSIS — M5136 Other intervertebral disc degeneration, lumbar region: Secondary | ICD-10-CM | POA: Diagnosis not present

## 2020-12-15 DIAGNOSIS — G894 Chronic pain syndrome: Secondary | ICD-10-CM | POA: Diagnosis not present

## 2020-12-15 DIAGNOSIS — Z79891 Long term (current) use of opiate analgesic: Secondary | ICD-10-CM | POA: Diagnosis not present

## 2020-12-15 DIAGNOSIS — M961 Postlaminectomy syndrome, not elsewhere classified: Secondary | ICD-10-CM | POA: Diagnosis not present

## 2020-12-15 DIAGNOSIS — M791 Myalgia, unspecified site: Secondary | ICD-10-CM | POA: Diagnosis not present

## 2020-12-15 DIAGNOSIS — M47816 Spondylosis without myelopathy or radiculopathy, lumbar region: Secondary | ICD-10-CM | POA: Diagnosis not present

## 2020-12-20 ENCOUNTER — Emergency Department (INDEPENDENT_AMBULATORY_CARE_PROVIDER_SITE_OTHER)
Admission: EM | Admit: 2020-12-20 | Discharge: 2020-12-20 | Disposition: A | Discharge status: Home / Self Care | Payer: Medicare Other | Source: Home / Self Care

## 2020-12-20 ENCOUNTER — Telehealth: Payer: Self-pay

## 2020-12-20 ENCOUNTER — Other Ambulatory Visit: Payer: Self-pay | Admitting: Family Medicine

## 2020-12-20 DIAGNOSIS — R002 Palpitations: Secondary | ICD-10-CM | POA: Diagnosis not present

## 2020-12-20 DIAGNOSIS — Z79899 Other long term (current) drug therapy: Secondary | ICD-10-CM | POA: Diagnosis not present

## 2020-12-20 DIAGNOSIS — R5383 Other fatigue: Secondary | ICD-10-CM

## 2020-12-20 DIAGNOSIS — Z20822 Contact with and (suspected) exposure to covid-19: Secondary | ICD-10-CM | POA: Diagnosis not present

## 2020-12-20 DIAGNOSIS — Z87891 Personal history of nicotine dependence: Secondary | ICD-10-CM | POA: Diagnosis not present

## 2020-12-20 DIAGNOSIS — I491 Atrial premature depolarization: Secondary | ICD-10-CM | POA: Diagnosis not present

## 2020-12-20 DIAGNOSIS — R0602 Shortness of breath: Secondary | ICD-10-CM

## 2020-12-20 DIAGNOSIS — I447 Left bundle-branch block, unspecified: Secondary | ICD-10-CM | POA: Diagnosis not present

## 2020-12-20 DIAGNOSIS — I499 Cardiac arrhythmia, unspecified: Secondary | ICD-10-CM

## 2020-12-20 DIAGNOSIS — R918 Other nonspecific abnormal finding of lung field: Secondary | ICD-10-CM | POA: Diagnosis not present

## 2020-12-20 DIAGNOSIS — R519 Headache, unspecified: Secondary | ICD-10-CM | POA: Diagnosis not present

## 2020-12-20 DIAGNOSIS — Z888 Allergy status to other drugs, medicaments and biological substances status: Secondary | ICD-10-CM | POA: Diagnosis not present

## 2020-12-20 NOTE — Telephone Encounter (Signed)
Carolyn Hendrix called and states she feels like something is sitting on her chest. She reports irregular heart rate and shortness of breath. I advised to go to the ED.

## 2020-12-20 NOTE — Telephone Encounter (Signed)
Agree with documentation as above.   Jerimy Johanson, MD  

## 2020-12-20 NOTE — ED Notes (Signed)
Patient is being discharged from the Urgent Care and sent to the Emergency Department via POV driven by son . Per Judeth Cornfield, patient is in need of higher level of care due to new onset cardiac arrhythmia with fatigue and shortness of breath. Patient is aware and verbalizes understanding of plan of care. Patient verbalized desire to go to [facility] center, pt given copies of EKG to bring with her to the ED. Vitals:   12/20/20 1637  BP: 123/80  Pulse: (!) 120  Resp: 16  Temp: 98.1 F (36.7 C)  SpO2: 99%

## 2020-12-20 NOTE — ED Triage Notes (Signed)
Patient presents to Urgent Care with complaints of fatigue and shortness of breath since 2 days ago. Patient reports she was palpating her pulse in her neck and felt like her heart was skipping beats. Pt tried to get in w/ PCP today and was told to go to the ED.

## 2020-12-20 NOTE — ED Provider Notes (Signed)
[facility]    CSN: [id number] Arrival date & time: 12/20/20  1629      History   Chief Complaint Chief Complaint  Patient presents with  . Fatigue    HPI ROCHANDA HARPHAM is a 80 y.o. female.   Reports that she feels like her heart is skipping beats. Reports that she is SOB and fatigued. Denies chest pain, jaw pain, arm pain, radiating back pain. Has never felt this before. Reports that she called her PCP and was told to go to the ER. Patient drove herself here and her son met her. Denies headache, nausea, vomiting, syncope, other symptoms. Has medical hx of GERD, anxiety, memory loss, colitis, extensive ortho history and diverticulosis.  ROS per HPI  The history is provided by the patient.    Past Medical History:  Diagnosis Date  . Chronic LBP    Lumbar DDD  . Colitis, ischemic ([place])   . Depression   . Endometriosis   . GERD (gastroesophageal reflux disease)   . Hematuria   . Hyperlipidemia   . IBS (irritable bowel syndrome)    Fr Hurrelbrink- on Miralax and Citrucel daily  . Migraines   . MVP (mitral valve prolapse)     Patient Active Problem List   Diagnosis Date Noted  . Dysequilibrium 08/10/2020  . 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 Caregiver stress 12/15/2019  . Primary osteoarthritis of both hips 10/08/2019  . Pseudogout of knee 06/10/2019  . Osteopenia 12/04/2018  . Avulsion fracture of right talus 06/27/2017  . Rotator cuff tendonitis, left 05/11/2017  . Hyperlipidemia 11/10/2016  . S/P insertion of spinal cord stimulator 02/23/2016  . Segmental and somatic dysfunction of lumbar region 06/11/2015  . Gluten intolerance 09/23/2013  . Postlaminectomy syndrome, not elsewhere classified 09/03/2013  . Low back pain 03/29/2013  . Lumbosacral spondylosis 03/29/2013  . Spasm of muscle 03/29/2013  . Disorder of sacrum 03/29/2013  . Chronic pain 03/29/2013  . Depressive disorder 03/29/2013  . Constipation 03/29/2013  . Anxiety state  03/29/2013  . History of mallet deformity correction, possible osteomyelitis 03/20/2013  . Insomnia 03/18/2013  . OSTEOARTHRITIS, HIP, RIGHT 02/14/2010  . Osteoarthritis 02/14/2010  . Anemia 06/02/2009  . 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 IRRITABLE BOWEL SYNDROME 06/02/2008  . Generalized weakness 06/02/2008  . Malaise and fatigue 06/02/2008  . GERD 03/30/2008  . DIVERTICULOSIS OF COLON 03/30/2008  . Major depression, recurrent, chronic ([place]) 02/17/2008  . ENDOMETRIOSIS 02/17/2008  . Donalds DISEASE, LUMBAR 02/17/2008  . ISCHEMIC COLITIS, HX OF 02/17/2008    Past Surgical History:  Procedure Laterality Date  . ABDOMINAL HYSTERECTOMY  1991   w/ bilat oophorectomy for endometriosis  . APPENDECTOMY    . BREAST BIOPSY    . ESOPHAGOGASTRODUODENOSCOPY  06  . fused 5th digit right hand Right   . HEMILAMINOTOMY LUMBAR SPINE  11/18/1991   Dr. Billey Chang  . lapartomy  1995  . OTHER SURGICAL HISTORY  1992, 1994  . right breast biopsy- benign    . SPINAL FUSION  08/18/97   Dr. Rowe Pavy at [facility], L4-5 post fusion with iliac crest bone graft  . TONSILLECTOMY AND ADENOIDECTOMY      OB History   No obstetric history on file.      Home Medications    Prior to Admission medications   Medication Sig Start Date End Date Taking? Authorizing Provider  acyclovir ointment (ZOVIRAX) 5 % Apply 1 application topically every 3 (three) hours. 06/21/20   Hali Marry,  MD  ALPRAZolam (XANAX) 0.25 MG tablet TAKE 1 TABLET BY MOUTH TWICE A DAY AS NEEDED FOR ANXIETY 11/16/20   Mozingo, Berdie Ogren, NP  baclofen (LIORESAL) 10 MG tablet TAKE 1 TABLET BY MOUTH AT BEDTIME AS NEEDED FOR MUSCLE SPASMS. 11/26/20   Hali Marry, MD  buPROPion (WELLBUTRIN XL) 300 MG 24 hr tablet Take 1 tablet (300 mg total) by mouth daily. 07/14/20   Hali Marry, MD  busPIRone (BUSPAR) 10 MG tablet Take two tablets twice daily. 06/16/20   Mozingo, Berdie Ogren, NP  Cholecalciferol (VITAMIN D3) 25 MCG  (1000 UT) CAPS 1 capsule    [provider]  dicyclomine (BENTYL) 10 MG capsule TAKE ONE CAPSULE 3 TIMES A DAY AS NEEDED SPASMS 10/04/20   Hali Marry, MD  escitalopram (LEXAPRO) 20 MG tablet TAKE 1 TABLET BY MOUTH EVERY DAY 09/20/20   Hali Marry, MD  glucosamine-chondroitin 500-400 MG tablet Take 1 tablet by mouth 2 (two) times daily.    [provider]  Kava, Piper methysticum, (KAVA KAVA) 200 MG CAPS See admin instructions.    [provider]  Magnesium 300 MG CAPS 1 capsule with a meal    [provider]  Multiple Vitamins-Minerals (SENTRY) TABS See admin instructions.    [provider]  naloxone [facility]) nasal spray 4 mg/0.1 mL Place into the nose. Patient not taking: Reported on 11/29/2020 09/16/18   [provider]  omeprazole (PRILOSEC) 20 MG capsule TAKE 1 CAPSULE BY MOUTH EVERY DAY 03/01/20   Hali Marry, MD  pravastatin (PRAVACHOL) 40 MG tablet TAKE 1 TABLET BY MOUTH EVERY DAY Patient taking differently: Patient had not been taking it everyday but she stated that she will now. 12/19/19   Hali Marry, MD  Probiotic Product (PROBIOTIC-10 PO) See admin instructions.    [provider]  pseudoephedrine (SUDAFED) 60 MG tablet 1 tablet as needed    [provider]  traMADol (ULTRAM) 50 MG tablet TAKE 1 TO 2 TABLETS BY MOUTH 3 TIMES A DAY AS NEEDED FOR PAIN 05/01/17   [provider]  traZODone (DESYREL) 100 MG tablet TAKE 1 TABLET BY MOUTH EVERYDAY AT BEDTIME 12/10/20   Hali Marry, MD  TURMERIC PO Take by mouth daily.    [provider]    Family History Family History  Problem Relation Age of Onset  . Alzheimer's disease Mother   . Other Father        ACS? - 22's  . Ataxia Sister     Social History Social History   Tobacco Use  . Smoking status: Former Smoker    Types: Cigarettes    Quit date: 10/23/1980    Years since quitting: 40.1  .  Smokeless tobacco: Never Used  Vaping Use  . Vaping Use: Never used  Substance Use Topics  . Alcohol use: No  . Drug use: No     Allergies   Cymbalta [duloxetine hcl] and Morphine   Review of Systems Review of Systems   Physical Exam Triage Vital Signs ED Triage Vitals  Enc Vitals Group     BP 12/20/20 1637 123/80     Pulse Rate 12/20/20 1637 (!) 120     Resp 12/20/20 1637 16     Temp 12/20/20 1637 98.1 F (36.7 C)     Temp Source 12/20/20 1637 Oral     SpO2 12/20/20 1637 99 %     Weight --      Height --  Head Circumference --      Peak Flow --      Pain Score 12/20/20 1633 0     Pain Loc --      Pain Edu? --      Excl. in [place]? --    No data found.  Updated Vital Signs BP 123/80 (BP Location: Right Arm)   Pulse (!) 120   Temp 98.1 F (36.7 C) (Oral)   Resp 16   SpO2 99%   Visual Acuity Right Eye Distance:   Left Eye Distance:   Bilateral Distance:    Right Eye Near:   Left Eye Near:    Bilateral Near:     Physical Exam Vitals and nursing note reviewed.  Constitutional:      General: She is not in acute distress.    Appearance: She is well-developed and well-nourished.  HENT:     Head: Normocephalic and atraumatic.  Eyes:     Extraocular Movements: Extraocular movements intact.     Conjunctiva/sclera: Conjunctivae normal.     Pupils: Pupils are equal, round, and reactive to light.  Cardiovascular:     Rate and Rhythm: Tachycardia present. Rhythm irregular.     Heart sounds: No murmur heard.   Pulmonary:     Effort: Pulmonary effort is normal. No respiratory distress.     Breath sounds: Normal breath sounds. No stridor. No wheezing, rhonchi or rales.  Chest:     Chest wall: No tenderness.  Abdominal:     Palpations: Abdomen is soft.     Tenderness: There is no abdominal tenderness.  Musculoskeletal:        General: No edema. Normal range of motion.     Cervical back: Normal range of motion and neck supple.  Skin:    General: Skin  is warm and dry.     Capillary Refill: Capillary refill takes less than 2 seconds.  Neurological:     General: No focal deficit present.     Mental Status: She is alert and oriented to person, place, and time. Mental status is at baseline.  Psychiatric:        Mood and Affect: Mood and affect normal.      UC Treatments / Results  Labs (all labs ordered are listed, but only abnormal results are displayed) Labs Reviewed - No data to display  EKG   Radiology No results found.  Procedures Procedures (including critical care time)  Medications Ordered in UC Medications - No data to display  Initial Impression / Assessment and Plan / UC Course  I have reviewed the triage vital signs and the nursing notes.  Pertinent labs & imaging results that were available during my care of the patient were reviewed by me and considered in my medical decision making (see chart for details).    Palpitations Irregular Cardiac Rhythm SOB Fatigue  EKG shows nonspecific heart block and T wave abnormality, tachycardia Discussed with patient that she would be best served in the ER for further workup EKG printouts given to patient, she would like to go to [facility] health system Declines EMS, son will take to ER via POV Stable at discharge  Final Clinical Impressions(s) / UC Diagnoses   Final diagnoses:  Other fatigue  SOB (shortness of breath)  Palpitations  Irregular cardiac rhythm   Discharge Instructions   None    ED Prescriptions    None     PDMP not reviewed this encounter.   Faustino Congress, NP 12/20/20 1842

## 2020-12-21 DIAGNOSIS — I447 Left bundle-branch block, unspecified: Secondary | ICD-10-CM | POA: Diagnosis not present

## 2020-12-22 ENCOUNTER — Ambulatory Visit: Payer: Medicare Other

## 2020-12-22 ENCOUNTER — Other Ambulatory Visit: Payer: Self-pay

## 2020-12-23 ENCOUNTER — Ambulatory Visit (INDEPENDENT_AMBULATORY_CARE_PROVIDER_SITE_OTHER): Payer: Medicare Other | Admitting: Family Medicine

## 2020-12-23 ENCOUNTER — Encounter: Payer: Self-pay | Admitting: Family Medicine

## 2020-12-23 VITALS — BP 116/53 | HR 72 | Ht 66.0 in | Wt 153.0 lb

## 2020-12-23 DIAGNOSIS — R5383 Other fatigue: Secondary | ICD-10-CM

## 2020-12-23 DIAGNOSIS — R002 Palpitations: Secondary | ICD-10-CM

## 2020-12-23 DIAGNOSIS — E7800 Pure hypercholesterolemia, unspecified: Secondary | ICD-10-CM

## 2020-12-23 DIAGNOSIS — E611 Iron deficiency: Secondary | ICD-10-CM

## 2020-12-23 NOTE — Assessment & Plan Note (Signed)
Due to recheck lipids.  Stressed the importance of taking her cholesterol medication to reduce her risk for heart disease.

## 2020-12-23 NOTE — Progress Notes (Signed)
Established Patient Office Visit  Subjective:  Patient ID: Carolyn Hendrix, female    DOB: [birth year]  Age: 80 y.o. MRN: [id number]  CC:  Chief Complaint  Patient presents with  . Hospitalization Follow-up    HPI Carolyn NovemberSandra R Hendrix presents for  F/U ED visit.  She had called her on 2/28 bc she felt an irregular pounding pulse and felt chest pressure. Was told to go to the ED.  She was seen there.  She had extensive work-up including continuous EKG monitoring, head CT, CT angio head and neck, chest x-ray and labs.  They were able to rule out heart attack and stroke.  She was diagnosed with having PVCs and PACs and even cardiology was consulted they recommended that she start metoprolol XL 25 mg and plan to see how she does with that.  So far she has been taking the medication and has been doing well with it she has not had any more significant episodes of symptoms.  She has been under a lot of stress recently as her husband Carolyn Hendrix passed away on December 31.  The family did not even notify her she found out through her church.  It is been pretty stressful in fact she tried reaching out to find a counselor or therapist and plans on actually starting some therapy with [facility]  She has a history of iron deficiency and would like to have it checked again.  She has just been feeling tired.  Past Medical History:  Diagnosis Date  . Chronic LBP    Lumbar DDD  . Colitis, ischemic ([facility])   . Depression   . Endometriosis   . GERD (gastroesophageal reflux disease)   . Hematuria   . Hyperlipidemia   . IBS (irritable bowel syndrome)    Fr Hurrelbrink- on Miralax and Citrucel daily  . Migraines   . MVP (mitral valve prolapse)     Past Surgical History:  Procedure Laterality Date  . ABDOMINAL HYSTERECTOMY  1991   w/ bilat oophorectomy for endometriosis  . APPENDECTOMY    . BREAST BIOPSY    . ESOPHAGOGASTRODUODENOSCOPY  06  . fused 5th digit right hand Right   . HEMILAMINOTOMY LUMBAR SPINE   11/18/1991   Dr. Lorain ChildesWilliam Bostic  . lapartomy  1995  . OTHER SURGICAL HISTORY  1992, 1994  . right breast biopsy- benign    . SPINAL FUSION  08/18/97   [facility], L4-5 post fusion with iliac crest bone graft  . TONSILLECTOMY AND ADENOIDECTOMY      Family History  Problem Relation Age of Onset  . Alzheimer's disease Mother   . Other Father        ACS? - 3580's  . Ataxia Sister     Social History   Socioeconomic History  . Marital status: Widowed    Spouse name: Carolyn Hendrix  . Number of children: 2  . Years of education: 5912  . Highest education level: 12th grade  Occupational History  . Occupation: retired    Comment: Print production planneroffice manager [place]  Tobacco Use  . Smoking status: Former Smoker    Types: Cigarettes    Quit date: 10/23/1980    Years since quitting: 40.1  . Smokeless tobacco: Never Used  Vaping Use  . Vaping Use: Never used  Substance and Sexual Activity  . Alcohol use: No  . Drug use: No  . Sexual activity: Not Currently  Other Topics Concern  . Not on  file  Social History Narrative   Her husband passed away in 2020/10/23. She is getting better at taking care of herself as she had to take care of her husband for the past 2.5 years. She lives alone with her dog.    Social Determinants of Health   Financial Resource Strain: Low Risk   . Difficulty of Paying Living Expenses: Not hard at all  Food Insecurity: No Food Insecurity  . Worried About Programme researcher, broadcasting/film/video in the Last Year: Never true  . Ran Out of Food in the Last Year: Never true  Transportation Needs: No Transportation Needs  . Lack of Transportation (Medical): No  . Lack of Transportation (Non-Medical): No  Physical Activity: Inactive  . Days of Exercise per Week: 0 days  . Minutes of Exercise per Session: 0 min  Stress: No Stress Concern Present  . Feeling of Stress : Not at all  Social Connections: Moderately Isolated  . Frequency of Communication with Friends and  Family: More than three times a week  . Frequency of Social Gatherings with Friends and Family: Never  . Attends Religious Services: 1 to 4 times per year  . Active Member of Clubs or Organizations: No  . Attends Banker Meetings: Never  . Marital Status: Widowed  Intimate Partner Violence: Not At Risk  . Fear of Current or Ex-Partner: No  . Emotionally Abused: No  . Physically Abused: No  . Sexually Abused: No    Outpatient Medications Prior to Visit  Medication Sig Dispense Refill  . acyclovir ointment (ZOVIRAX) 5 % Apply 1 application topically every 3 (three) hours. 5 g 2  . ALPRAZolam (XANAX) 0.25 MG tablet TAKE 1 TABLET BY MOUTH TWICE A DAY AS NEEDED FOR ANXIETY 60 tablet 0  . baclofen (LIORESAL) 10 MG tablet TAKE 1 TABLET BY MOUTH AT BEDTIME AS NEEDED FOR MUSCLE SPASMS. 30 tablet 1  . buPROPion (WELLBUTRIN XL) 300 MG 24 hr tablet Take 1 tablet (300 mg total) by mouth daily. 90 tablet 1  . Cholecalciferol (VITAMIN D3) 25 MCG (1000 UT) CAPS 1 capsule    . dicyclomine (BENTYL) 10 MG capsule TAKE ONE CAPSULE 3 TIMES A DAY AS NEEDED SPASMS 270 capsule 0  . escitalopram (LEXAPRO) 20 MG tablet TAKE 1 TABLET BY MOUTH EVERY DAY 90 tablet 1  . glucosamine-chondroitin 500-400 MG tablet Take 1 tablet by mouth 2 (two) times daily.    Gwynneth Aliment, Piper methysticum, (KAVA KAVA) 200 MG CAPS See admin instructions.    . Magnesium 300 MG CAPS 1 capsule with a meal    . metoprolol succinate (TOPROL-XL) 25 MG 24 hr tablet Take 25 mg by mouth daily.    . Multiple Vitamins-Minerals (SENTRY) TABS See admin instructions.    . naloxone (NARCAN) nasal spray 4 mg/0.1 mL Place into the nose.    Marland Kitchen omeprazole (PRILOSEC) 20 MG capsule TAKE 1 CAPSULE BY MOUTH EVERY DAY 90 capsule 3  . pravastatin (PRAVACHOL) 40 MG tablet TAKE 1 TABLET BY MOUTH EVERY DAY (Patient taking differently: Patient had not been taking it everyday but she stated that she will now.) 90 tablet 3  . Probiotic Product (PROBIOTIC-10  PO) See admin instructions.    . pseudoephedrine (SUDAFED) 60 MG tablet 1 tablet as needed    . traMADol (ULTRAM) 50 MG tablet TAKE 1 TO 2 TABLETS BY MOUTH 3 TIMES A DAY AS NEEDED FOR PAIN  1  . traZODone (DESYREL) 100 MG tablet TAKE 1 TABLET  BY MOUTH EVERYDAY AT BEDTIME 90 tablet 0  . TURMERIC PO Take by mouth daily.    . busPIRone (BUSPAR) 10 MG tablet Take two tablets twice daily. 120 tablet 2   No facility-administered medications prior to visit.    Allergies  Allergen Reactions  . Cymbalta [Duloxetine Hcl] Diarrhea  . Morphine Hives    ROS Review of Systems    Objective:    Physical Exam Constitutional:      Appearance: She is well-developed and well-nourished.  HENT:     Head: Normocephalic and atraumatic.  Cardiovascular:     Rate and Rhythm: Normal rate and regular rhythm.     Heart sounds: Normal heart sounds.  Pulmonary:     Effort: Pulmonary effort is normal.     Breath sounds: Normal breath sounds.  Skin:    General: Skin is warm and dry.  Neurological:     Mental Status: She is alert and oriented to person, place, and time.  Psychiatric:        Mood and Affect: Mood and affect normal.        Behavior: Behavior normal.     Ht 5\' 6"  (1.676 m)   BMI 25.34 kg/m  Wt Readings from Last 3 Encounters:  08/10/20 157 lb (71.2 kg)  06/21/20 150 lb (68 kg)  12/15/19 151 lb (68.5 kg)     There are no preventive care reminders to display for this patient.  There are no preventive care reminders to display for this patient.  Lab Results  Component Value Date   TSH 1.81 11/16/2017   Lab Results  Component Value Date   WBC 12.0 (H) 09/23/2019   HGB 14.0 09/23/2019   HCT 41.5 09/23/2019   MCV 94.3 09/23/2019   PLT 413 (H) 09/23/2019   Lab Results  Component Value Date   NA 140 12/18/2019   K 4.8 12/18/2019   CO2 30 12/18/2019   GLUCOSE 70 12/18/2019   BUN 22 12/18/2019   CREATININE 0.86 12/18/2019   BILITOT 0.4 12/18/2019   ALKPHOS 80  11/10/2016   AST 20 12/18/2019   ALT 16 12/18/2019   PROT 6.3 12/18/2019   ALBUMIN 4.3 11/10/2016   CALCIUM 8.9 12/18/2019   Lab Results  Component Value Date   CHOL 185 12/18/2019   Lab Results  Component Value Date   HDL 49 (L) 12/18/2019   Lab Results  Component Value Date   LDLCALC 111 (H) 12/18/2019   Lab Results  Component Value Date   TRIG 135 12/18/2019   Lab Results  Component Value Date   CHOLHDL 3.8 12/18/2019   No results found for: HGBA1C    Assessment & Plan:   Problem List Items Addressed This Visit      Other   Palpitation    Likely secondary to PVCs and PACs based on work-up in the emergency department.  We discussed making sure she is well-hydrated, avoiding caffeine and decongestants.  Try to get adequate sleep.  Working on lowering stress levels.  Continue with the metoprolol 25 mg XL daily.  Plan to follow-up in 1 month to make sure that she is doing well.      Hyperlipidemia - Primary    Due to recheck lipids.  Stressed the importance of taking her cholesterol medication to reduce her risk for heart disease.      Relevant Medications   metoprolol succinate (TOPROL-XL) 25 MG 24 hr tablet   Other Relevant Orders   CBC  COMPLETE METABOLIC PANEL WITH GFR   Lipid panel   Fe+TIBC+Fer    Other Visit Diagnoses    Iron deficiency       Relevant Orders   CBC   COMPLETE METABOLIC PANEL WITH GFR   Lipid panel   Fe+TIBC+Fer   Other fatigue          No orders of the defined types were placed in this encounter.   Follow-up: Return in about 4 weeks (around 01/20/2021) for palpitations .    Nani Gasser, MD

## 2020-12-23 NOTE — Assessment & Plan Note (Signed)
Likely secondary to PVCs and PACs based on work-up in the emergency department.  We discussed making sure she is well-hydrated, avoiding caffeine and decongestants.  Try to get adequate sleep.  Working on lowering stress levels.  Continue with the metoprolol 25 mg XL daily.  Plan to follow-up in 1 month to make sure that she is doing well.

## 2020-12-24 DIAGNOSIS — E7800 Pure hypercholesterolemia, unspecified: Secondary | ICD-10-CM | POA: Diagnosis not present

## 2020-12-24 DIAGNOSIS — E611 Iron deficiency: Secondary | ICD-10-CM | POA: Diagnosis not present

## 2020-12-25 LAB — CBC
HCT: 40.7 % (ref 35.0–45.0)
Hemoglobin: 13.5 g/dL (ref 11.7–15.5)
MCH: 30.9 pg (ref 27.0–33.0)
MCHC: 33.2 g/dL (ref 32.0–36.0)
MCV: 93.1 fL (ref 80.0–100.0)
MPV: 9.2 fL (ref 7.5–12.5)
Platelets: 324 10*3/uL (ref 140–400)
RBC: 4.37 10*6/uL (ref 3.80–5.10)
RDW: 13 % (ref 11.0–15.0)
WBC: 8.7 10*3/uL (ref 3.8–10.8)

## 2020-12-25 LAB — IRON,TIBC AND FERRITIN PANEL
%SAT: 24 % (calc) (ref 16–45)
Ferritin: 28 ng/mL (ref 16–288)
Iron: 67 ug/dL (ref 45–160)
TIBC: 282 mcg/dL (calc) (ref 250–450)

## 2020-12-25 LAB — COMPLETE METABOLIC PANEL WITH GFR
AG Ratio: 1.7 (calc) (ref 1.0–2.5)
ALT: 17 U/L (ref 6–29)
AST: 16 U/L (ref 10–35)
Albumin: 4 g/dL (ref 3.6–5.1)
Alkaline phosphatase (APISO): 71 U/L (ref 37–153)
BUN/Creatinine Ratio: 24 (calc) — ABNORMAL HIGH (ref 6–22)
BUN: 27 mg/dL — ABNORMAL HIGH (ref 7–25)
CO2: 28 mmol/L (ref 20–32)
Calcium: 9.2 mg/dL (ref 8.6–10.4)
Chloride: 105 mmol/L (ref 98–110)
Creat: 1.11 mg/dL — ABNORMAL HIGH (ref 0.60–0.93)
GFR, Est African American: 55 mL/min/{1.73_m2} — ABNORMAL LOW (ref >=60)
GFR, Est Non African American: 48 mL/min/{1.73_m2} — ABNORMAL LOW (ref >=60)
Globulin: 2.4 g/dL (calc) (ref 1.9–3.7)
Glucose, Bld: 93 mg/dL (ref 65–99)
Potassium: 4.5 mmol/L (ref 3.5–5.3)
Sodium: 143 mmol/L (ref 135–146)
Total Bilirubin: 0.3 mg/dL (ref 0.2–1.2)
Total Protein: 6.4 g/dL (ref 6.1–8.1)

## 2020-12-25 LAB — LIPID PANEL
Cholesterol: 179 mg/dL (ref <200)
HDL: 47 mg/dL — ABNORMAL LOW (ref >=50)
LDL Cholesterol (Calc): 109 mg/dL (calc) — ABNORMAL HIGH
Non-HDL Cholesterol (Calc): 132 mg/dL (calc) — ABNORMAL HIGH (ref <130)
Total CHOL/HDL Ratio: 3.8 (calc) (ref <5.0)
Triglycerides: 122 mg/dL (ref <150)

## 2020-12-27 ENCOUNTER — Other Ambulatory Visit: Payer: Self-pay | Admitting: *Deleted

## 2020-12-27 DIAGNOSIS — R7989 Other specified abnormal findings of blood chemistry: Secondary | ICD-10-CM

## 2020-12-29 DIAGNOSIS — H52201 Unspecified astigmatism, right eye: Secondary | ICD-10-CM | POA: Diagnosis not present

## 2020-12-29 DIAGNOSIS — H25813 Combined forms of age-related cataract, bilateral: Secondary | ICD-10-CM | POA: Diagnosis not present

## 2020-12-29 DIAGNOSIS — H43812 Vitreous degeneration, left eye: Secondary | ICD-10-CM | POA: Diagnosis not present

## 2020-12-29 DIAGNOSIS — H35363 Drusen (degenerative) of macula, bilateral: Secondary | ICD-10-CM | POA: Diagnosis not present

## 2020-12-29 DIAGNOSIS — H527 Unspecified disorder of refraction: Secondary | ICD-10-CM | POA: Diagnosis not present

## 2021-01-04 ENCOUNTER — Other Ambulatory Visit: Payer: Self-pay | Admitting: Family Medicine

## 2021-01-04 ENCOUNTER — Other Ambulatory Visit: Payer: Self-pay | Admitting: Adult Health

## 2021-01-06 ENCOUNTER — Encounter: Payer: Self-pay | Admitting: Family Medicine

## 2021-01-16 ENCOUNTER — Other Ambulatory Visit: Payer: Self-pay | Admitting: Family Medicine

## 2021-01-17 ENCOUNTER — Telehealth: Payer: Medicare Other | Admitting: Adult Health

## 2021-01-17 DIAGNOSIS — F489 Nonpsychotic mental disorder, unspecified: Secondary | ICD-10-CM

## 2021-01-17 DIAGNOSIS — Z0389 Encounter for observation for other suspected diseases and conditions ruled out: Secondary | ICD-10-CM

## 2021-01-17 NOTE — Progress Notes (Signed)
No show mychart video appt. Called and LVM for patient to call and R/S.

## 2021-01-19 ENCOUNTER — Encounter: Payer: Self-pay | Admitting: Family Medicine

## 2021-01-20 ENCOUNTER — Telehealth (INDEPENDENT_AMBULATORY_CARE_PROVIDER_SITE_OTHER): Payer: Medicare Other | Admitting: Family Medicine

## 2021-01-20 DIAGNOSIS — F411 Generalized anxiety disorder: Secondary | ICD-10-CM

## 2021-01-20 DIAGNOSIS — R251 Tremor, unspecified: Secondary | ICD-10-CM | POA: Diagnosis not present

## 2021-01-20 MED ORDER — SERTRALINE HCL 50 MG PO TABS: ORAL_TABLET | ORAL | 0 refills | Status: DC

## 2021-01-20 MED ORDER — ESCITALOPRAM OXALATE 5 MG PO TABS: 5.0000 mg | ORAL_TABLET | Freq: Every day | ORAL | 0 refills | Status: DC

## 2021-01-20 NOTE — Progress Notes (Signed)
Tremors for 2 days unsure if this is related to what she is dealing with. She reports that she still feels a slight tremor but not as much as it was 2 days ago.

## 2021-01-20 NOTE — Progress Notes (Signed)
Virtual Visit via Video Note  I connected with [name] on 01/21/21 at  3:20 PM EDT by a video enabled telemedicine application and verified that I am speaking with the correct person using two identifiers.   I discussed the limitations of evaluation and management by telemedicine and the availability of in person appointments. The patient expressed understanding and agreed to proceed.  Patient location: at home  Provider location: in office   Acute Office Visit  Subjective:    Patient ID: Carolyn Hendrix, female    DOB: 12/24/1940, 80 y.o.   MRN: [id number]  Chief Complaint  Patient presents with  . Anxiety    HPI Patient is in today for Anxiety - Tremors for 2 days unsure if this is related to what she is dealing with. She reports that she still feels a slight tremor but not as much as it was 2 days ago. She had to go to bed. She had been trying to do her taxes.  She was used to filing jointly with her husband who recently passed away and was having a really difficult time figuring out how to adjust and change the taxes.  Her daughter Judeth Cornfield 1 was really helpful but this was also really stressful for her..  Had eaten normally. She finally took her xanax and that helped. Says it ws finally better. She is touch with the therapist through [facility]osp[facility].   She is sleeping too much in the mornings. Laying back down. She is sleeping too much.    Past Medical History:  Diagnosis Date  . Chronic LBP    Lumbar DDD  . Colitis, ischemic (HCC)   . Depression   . Endometriosis   . GERD (gastroesophageal reflux disease)   . Hematuria   . Hyperlipidemia   . IBS (irritable bowel syndrome)    Fr Hurrelbrink- on Miralax and Citrucel daily  . Migraines   . MVP (mitral valve prolapse)     Past Surgical History:  Procedure Laterality Date  . ABDOMINAL HYSTERECTOMY  1991   w/ bilat oophorectomy for endometriosis  . APPENDECTOMY    . BREAST BIOPSY    . ESOPHAGOGASTRODUODENOSCOPY  06  .  fused 5th digit right hand Right   . HEMILAMINOTOMY LUMBAR SPINE  11/18/1991   Dr. Lorain Childes  . lapartomy  1995  . OTHER SURGICAL HISTORY  1992, 1994  . right breast biopsy- benign    . SPINAL FUSION  08/18/97   Dr. Dorita Fray at [facility], L4-5 post fusion with iliac crest bone graft  . TONSILLECTOMY AND ADENOIDECTOMY      Family History  Problem Relation Age of Onset  . Alzheimer's disease Mother   . Other Father        ACS? - 65's  . Ataxia Sister     Social History   Socioeconomic History  . Marital status: Widowed    Spouse name: [name]  . Number of children: 2  . Years of education: 80  . Highest education level: 12th grade  Occupational History  . Occupation: retired    Comment: Print production planner for city of [employer]  Tobacco Use  . Smoking status: Former Smoker    Types: Cigarettes    Quit date: 10/23/1980    Years since quitting: 40.2  . Smokeless tobacco: Never Used  Vaping Use  . Vaping Use: Never used  Substance and Sexual Activity  . Alcohol use: No  . Drug use: No  . Sexual activity: Not Currently  Other Topics Concern  . Not on file  Social History Narrative   Her husband passed away in 10/13/2020. She is getting better at taking care of herself as she had to take care of her husband for the past 2.5 years. She lives alone with her dog.    Social Determinants of Health   Financial Resource Strain: Low Risk   . Difficulty of Paying Living Expenses: Not hard at all  Food Insecurity: No Food Insecurity  . Worried About Programme researcher, broadcasting/film/video in the Last Year: Never true  . Ran Out of Food in the Last Year: Never true  Transportation Needs: No Transportation Needs  . Lack of Transportation (Medical): No  . Lack of Transportation (Non-Medical): No  Physical Activity: Inactive  . Days of Exercise per Week: 0 days  . Minutes of Exercise per Session: 0 min  Stress: No Stress Concern Present  . Feeling of Stress : Not at all  Social Connections:  Moderately Isolated  . Frequency of Communication with Friends and Family: More than three times a week  . Frequency of Social Gatherings with Friends and Family: Never  . Attends Religious Services: 1 to 4 times per year  . Active Member of Clubs or Organizations: No  . Attends Banker Meetings: Never  . Marital Status: Widowed  Intimate Partner Violence: Not At Risk  . Fear of Current or Ex-Partner: No  . Emotionally Abused: No  . Physically Abused: No  . Sexually Abused: No    Outpatient Medications Prior to Visit  Medication Sig Dispense Refill  . acyclovir ointment (ZOVIRAX) 5 % Apply 1 application topically every 3 (three) hours. 5 g 2  . ALPRAZolam (XANAX) 0.25 MG tablet TAKE 1 TABLET BY MOUTH TWICE A DAY AS NEEDED FOR ANXIETY 60 tablet 0  . baclofen (LIORESAL) 10 MG tablet TAKE 1 TABLET BY MOUTH AT BEDTIME AS NEEDED FOR MUSCLE SPASMS. 30 tablet 1  . buPROPion (WELLBUTRIN XL) 300 MG 24 hr tablet TAKE 1 TABLET BY MOUTH EVERY DAY 90 tablet 1  . Cholecalciferol (VITAMIN D3) 25 MCG (1000 UT) CAPS 1 capsule    . dicyclomine (BENTYL) 10 MG capsule TAKE 1 CAPSULE BY MOUTH THREE TIMES A DAY AS NEEDED FOR SPASMS 270 capsule 0  . glucosamine-chondroitin 500-400 MG tablet Take 1 tablet by mouth 2 (two) times daily.    . hydrOXYzine (ATARAX/VISTARIL) 10 MG tablet TAKE 1-2 TABLETS (10-20 MG TOTAL) BY MOUTH 3 (THREE) TIMES DAILY AS NEEDED FOR ANXIETY. 360 tablet 0  . Kava, Piper methysticum, (KAVA KAVA) 200 MG CAPS See admin instructions.    . metoprolol succinate (TOPROL-XL) 25 MG 24 hr tablet Take 25 mg by mouth daily.    . Multiple Vitamins-Minerals (SENTRY) TABS See admin instructions.    . naloxone (NARCAN) nasal spray 4 mg/0.1 mL Place into the nose.    Marland Kitchen omeprazole (PRILOSEC) 20 MG capsule TAKE 1 CAPSULE BY MOUTH EVERY DAY 90 capsule 3  . pravastatin (PRAVACHOL) 40 MG tablet TAKE 1 TABLET BY MOUTH EVERY DAY (Patient taking differently: Patient had not been taking it everyday  but she stated that she will now.) 90 tablet 3  . Probiotic Product (PROBIOTIC-10 PO) See admin instructions.    . pseudoephedrine (SUDAFED) 60 MG tablet 1 tablet as needed    . traMADol (ULTRAM) 50 MG tablet TAKE 1 TO 2 TABLETS BY MOUTH 3 TIMES A DAY AS NEEDED FOR PAIN  1  . traZODone (DESYREL) 100 MG  tablet TAKE 1 TABLET BY MOUTH EVERYDAY AT BEDTIME 90 tablet 0  . TURMERIC PO Take by mouth daily.    Marland Kitchen. escitalopram (LEXAPRO) 20 MG tablet TAKE 1 TABLET BY MOUTH EVERY DAY 90 tablet 1  . Magnesium 300 MG CAPS 1 capsule with a meal     No facility-administered medications prior to visit.    Allergies  Allergen Reactions  . Cymbalta [Duloxetine Hcl] Diarrhea  . Morphine Hives    Review of Systems     Objective:    Physical Exam Constitutional:      Appearance: She is well-developed.  HENT:     Head: Normocephalic and atraumatic.  Pulmonary:     Effort: Pulmonary effort is normal.  Neurological:     Mental Status: She is alert and oriented to person, place, and time.  Psychiatric:        Behavior: Behavior normal.     There were no vitals taken for this visit. Wt Readings from Last 3 Encounters:  12/23/20 153 lb (69.4 kg)  08/10/20 157 lb (71.2 kg)  06/21/20 150 lb (68 kg)    There are no preventive care reminders to display for this patient.  There are no preventive care reminders to display for this patient.   Lab Results  Component Value Date   TSH 1.81 11/16/2017   Lab Results  Component Value Date   WBC 8.7 12/24/2020   HGB 13.5 12/24/2020   HCT 40.7 12/24/2020   MCV 93.1 12/24/2020   PLT 324 12/24/2020   Lab Results  Component Value Date   NA 143 12/24/2020   K 4.5 12/24/2020   CO2 28 12/24/2020   GLUCOSE 93 12/24/2020   BUN 27 (H) 12/24/2020   CREATININE 1.11 (H) 12/24/2020   BILITOT 0.3 12/24/2020   ALKPHOS 80 11/10/2016   AST 16 12/24/2020   ALT 17 12/24/2020   PROT 6.4 12/24/2020   ALBUMIN 4.3 11/10/2016   CALCIUM 9.2 12/24/2020   Lab  Results  Component Value Date   CHOL 179 12/24/2020   Lab Results  Component Value Date   HDL 47 (L) 12/24/2020   Lab Results  Component Value Date   LDLCALC 109 (H) 12/24/2020   Lab Results  Component Value Date   TRIG 122 12/24/2020   Lab Results  Component Value Date   CHOLHDL 3.8 12/24/2020   No results found for: HGBA1C     Assessment & Plan:   Problem List Items Addressed This Visit      Other   Anxiety state    Discussed that I really feel like a lot of her symptoms were related to anxiety.  The tremors seem to have improved on their own and she actually had similar symptoms about a year ago when she was going through a lot of severe emotional stress.  We discussed options including changing her Lexapro which I think would actually be really reasonable at this point.  We will switch to sertraline which might be a little bit more effective for her anxiety we will continue the Wellbutrin for now.  Taper written out and given.  She will decrease her Lexapro down to half a tab for 1 week and then take 5 mg daily for a week I did send over new prescription for that.  Encouraged her to continue to stay connected with a therapist through hospice which I think is really helpful.  I will plan to see her back in about 4 to 5 weeks.  Relevant Medications   escitalopram (LEXAPRO) 5 MG tablet   sertraline (ZOLOFT) 50 MG tablet    Other Visit Diagnoses    Tremor    -  Primary     Tremor-it does not sound like it is a persistent tremor that needs further work-up.  It sounds like this was exacerbated by some emotional distress we will continue to keep an eye on it.  It recurs we can always proceed with further work-up.  Meds ordered this encounter  Medications  . escitalopram (LEXAPRO) 5 MG tablet    Sig: Take 1 tablet (5 mg total) by mouth daily.    Dispense:  8 tablet    Refill:  0  . sertraline (ZOLOFT) 50 MG tablet    Sig: Take 0.5 tablets (25 mg total) by mouth daily  for 8 days, THEN 1 tablet (50 mg total) daily for 22 days.    Dispense:  30 tablet    Refill:  0    Time spent in encounter 22 minutes  I discussed the assessment and treatment plan with the patient. The patient was provided an opportunity to ask questions and all were answered. The patient agreed with the plan and demonstrated an understanding of the instructions.   The patient was advised to call back or seek an in-person evaluation if the symptoms worsen or if the condition fails to improve as anticipated.    Nani Gasser, MD

## 2021-01-21 ENCOUNTER — Encounter: Payer: Self-pay | Admitting: Family Medicine

## 2021-01-21 NOTE — Assessment & Plan Note (Signed)
Discussed that I really feel like a lot of her symptoms were related to anxiety.  The tremors seem to have improved on their own and she actually had similar symptoms about a year ago when she was going through a lot of severe emotional stress.  We discussed options including changing her Lexapro which I think would actually be really reasonable at this point.  We will switch to sertraline which might be a little bit more effective for her anxiety we will continue the Wellbutrin for now.  Taper written out and given.  She will decrease her Lexapro down to half a tab for 1 week and then take 5 mg daily for a week I did send over new prescription for that.  Encouraged her to continue to stay connected with a therapist through hospice which I think is really helpful.  I will plan to see her back in about 4 to 5 weeks.

## 2021-01-24 ENCOUNTER — Ambulatory Visit: Payer: Medicare Other | Admitting: Family Medicine

## 2021-01-27 ENCOUNTER — Other Ambulatory Visit: Payer: Self-pay | Admitting: Family Medicine

## 2021-02-01 DIAGNOSIS — H527 Unspecified disorder of refraction: Secondary | ICD-10-CM | POA: Diagnosis not present

## 2021-02-01 DIAGNOSIS — H25813 Combined forms of age-related cataract, bilateral: Secondary | ICD-10-CM | POA: Diagnosis not present

## 2021-02-03 ENCOUNTER — Encounter: Payer: Self-pay | Admitting: Adult Health

## 2021-02-03 ENCOUNTER — Telehealth (INDEPENDENT_AMBULATORY_CARE_PROVIDER_SITE_OTHER): Payer: Medicare Other | Admitting: Adult Health

## 2021-02-03 DIAGNOSIS — F411 Generalized anxiety disorder: Secondary | ICD-10-CM | POA: Diagnosis not present

## 2021-02-03 DIAGNOSIS — G4700 Insomnia, unspecified: Secondary | ICD-10-CM

## 2021-02-03 DIAGNOSIS — F339 Major depressive disorder, recurrent, unspecified: Secondary | ICD-10-CM | POA: Diagnosis not present

## 2021-02-03 MED ORDER — ALPRAZOLAM 0.25 MG PO TABS: ORAL_TABLET | ORAL | 0 refills | Status: DC

## 2021-02-03 NOTE — Progress Notes (Signed)
Carolyn Hendrix [id number] 24-Mar-1941 80 y.o.  Virtual Visit via Video Note  I connected with pt @ on 02/03/21 at  1:20 PM EDT by a video enabled telemedicine application and verified that I am speaking with the correct person using two identifiers.   I discussed the limitations of evaluation and management by telemedicine and the availability of in person appointments. The patient expressed understanding and agreed to proceed.  I discussed the assessment and treatment plan with the patient. The patient was provided an opportunity to ask questions and all were answered. The patient agreed with the plan and demonstrated an understanding of the instructions.   The patient was advised to call back or seek an in-person evaluation if the symptoms worsen or if the condition fails to improve as anticipated.  I provided 30 minutes of non-face-to-face time during this encounter.  The patient was located at home.  The provider was located at [facility].   Dorothyann Gibbs, NP   Subjective:   Patient ID:  Carolyn Hendrix is a 80 y.o. (DOB [birth year]) female.  Chief Complaint: No chief complaint on file.   HPI Carolyn Hendrix presents for follow-up of MDD, GAD, and insomnia.   Describes mood today as "ok". Pleasant. Denies tearfulness. Mood symptoms - reports some depression, anxiety and irritability. Getting the "trembles". Had an episode recently while staying with daughter - "it lasted for 2 days". Husband passed away on 11-08-2023- was able to attend his funeral. Daughter picking her up today and taking her home and onto the beach for a week. Plans to attend grand-daughters graduation. Varying interest and motivation. Taking medications as prescribed.  Energy levels stable. Active, does not have a regular exercise routine.  Not able to enjoy usual interests and activities. Widowed. Has 2 children. Spending time with family. Appetite adequate. Weight stable at 150 pounds.   Sleeps well most nights. Averages 9 hours. Stating "I rest a lot". Focus and concentration stable. Completing tasks. Managing some aspects of household. Retired from [place] [employer]. Denies SI or HI.  Denies AH or VH.  Talking to therapist.  Previous medication trials: Buspar, Cymbalta  Review of Systems:  Review of Systems  Musculoskeletal: Negative for gait problem.  Neurological: Negative for tremors.  Psychiatric/Behavioral:       Please refer to HPI    Medications: I have reviewed the patient's current medications.  Current Outpatient Medications  Medication Sig Dispense Refill  . acyclovir ointment (ZOVIRAX) 5 % Apply 1 application topically every 3 (three) hours. 5 g 2  . ALPRAZolam (XANAX) 0.25 MG tablet TAKE 1 TABLET BY MOUTH TWICE A DAY AS NEEDED FOR ANXIETY 60 tablet 0  . baclofen (LIORESAL) 10 MG tablet TAKE 1 TABLET BY MOUTH AT BEDTIME AS NEEDED FOR MUSCLE SPASMS. 30 tablet 1  . buPROPion (WELLBUTRIN XL) 300 MG 24 hr tablet TAKE 1 TABLET BY MOUTH EVERY DAY 90 tablet 1  . Cholecalciferol (VITAMIN D3) 25 MCG (1000 UT) CAPS 1 capsule    . dicyclomine (BENTYL) 10 MG capsule TAKE 1 CAPSULE BY MOUTH THREE TIMES A DAY AS NEEDED FOR SPASMS 270 capsule 0  . escitalopram (LEXAPRO) 5 MG tablet Take 1 tablet (5 mg total) by mouth daily. 8 tablet 0  . glucosamine-chondroitin 500-400 MG tablet Take 1 tablet by mouth 2 (two) times daily.    . hydrOXYzine (ATARAX/VISTARIL) 10 MG tablet TAKE 1-2 TABLETS (10-20 MG TOTAL) BY MOUTH 3 (THREE) TIMES DAILY AS NEEDED FOR  ANXIETY. 360 tablet 0  . Kava, Piper methysticum, (KAVA KAVA) 200 MG CAPS See admin instructions.    . Magnesium 300 MG CAPS 1 capsule with a meal    . metoprolol succinate (TOPROL-XL) 25 MG 24 hr tablet Take 25 mg by mouth daily.    . Multiple Vitamins-Minerals (SENTRY) TABS See admin instructions.    . naloxone (NARCAN) nasal spray 4 mg/0.1 mL Place into the nose.    Marland Kitchen omeprazole (PRILOSEC) 20 MG capsule TAKE 1 CAPSULE  BY MOUTH EVERY DAY 90 capsule 3  . pravastatin (PRAVACHOL) 40 MG tablet TAKE 1 TABLET BY MOUTH EVERY DAY (Patient taking differently: Patient had not been taking it everyday but she stated that she will now.) 90 tablet 3  . Probiotic Product (PROBIOTIC-10 PO) See admin instructions.    . pseudoephedrine (SUDAFED) 60 MG tablet 1 tablet as needed    . sertraline (ZOLOFT) 50 MG tablet Take 0.5 tablets (25 mg total) by mouth daily for 8 days, THEN 1 tablet (50 mg total) daily for 22 days. 30 tablet 0  . traMADol (ULTRAM) 50 MG tablet TAKE 1 TO 2 TABLETS BY MOUTH 3 TIMES A DAY AS NEEDED FOR PAIN  1  . traZODone (DESYREL) 100 MG tablet TAKE 1 TABLET BY MOUTH EVERYDAY AT BEDTIME 90 tablet 0  . TURMERIC PO Take by mouth daily.     No current facility-administered medications for this visit.    Medication Side Effects: None  Allergies:  Allergies  Allergen Reactions  . Cymbalta [Duloxetine Hcl] Diarrhea  . Morphine Hives    Past Medical History:  Diagnosis Date  . Chronic LBP    Lumbar DDD  . Colitis, ischemic (HCC)   . Depression   . Endometriosis   . GERD (gastroesophageal reflux disease)   . Hematuria   . Hyperlipidemia   . IBS (irritable bowel syndrome)    Fr Hurrelbrink- on Miralax and Citrucel daily  . Migraines   . MVP (mitral valve prolapse)     Family History  Problem Relation Age of Onset  . Alzheimer's disease Mother   . Other Father        ACS? - 13's  . Ataxia Sister     Social History   Socioeconomic History  . Marital status: Widowed    Spouse name: [name]  . Number of children: 2  . Years of education: 30  . Highest education level: 12th grade  Occupational History  . Occupation: retired    Comment: Print production planner for city of [employer]  Tobacco Use  . Smoking status: Former Smoker    Types: Cigarettes    Quit date: 10/23/1980    Years since quitting: 40.3  . Smokeless tobacco: Never Used  Vaping Use  . Vaping Use: Never used  Substance and Sexual  Activity  . Alcohol use: No  . Drug use: No  . Sexual activity: Not Currently  Other Topics Concern  . Not on file  Social History Narrative   Her husband passed away in 2020/11/05. She is getting better at taking care of herself as she had to take care of her husband for the past 2.5 years. She lives alone with her dog.    Social Determinants of Health   Financial Resource Strain: Low Risk   . Difficulty of Paying Living Expenses: Not hard at all  Food Insecurity: No Food Insecurity  . Worried About Programme researcher, broadcasting/film/video in the Last Year: Never true  . Ran  Out of Food in the Last Year: Never true  Transportation Needs: No Transportation Needs  . Lack of Transportation (Medical): No  . Lack of Transportation (Non-Medical): No  Physical Activity: Inactive  . Days of Exercise per Week: 0 days  . Minutes of Exercise per Session: 0 min  Stress: No Stress Concern Present  . Feeling of Stress : Not at all  Social Connections: Moderately Isolated  . Frequency of Communication with Friends and Family: More than three times a week  . Frequency of Social Gatherings with Friends and Family: Never  . Attends Religious Services: 1 to 4 times per year  . Active Member of Clubs or Organizations: No  . Attends Banker Meetings: Never  . Marital Status: Widowed  Intimate Partner Violence: Not At Risk  . Fear of Current or Ex-Partner: No  . Emotionally Abused: No  . Physically Abused: No  . Sexually Abused: No    Past Medical History, Surgical history, Social history, and Family history were reviewed and updated as appropriate.   Please see review of systems for further details on the patient's review from today.   Objective:   Physical Exam:  There were no vitals taken for this visit.  Physical Exam Constitutional:      General: She is not in acute distress. Musculoskeletal:        General: No deformity.  Neurological:     Mental Status: She is alert and oriented to  person, place, and time.     Coordination: Coordination normal.  Psychiatric:        Attention and Perception: Attention and perception normal. She does not perceive auditory or visual hallucinations.        Mood and Affect: Mood normal. Mood is not anxious or depressed. Affect is not labile, blunt, angry or inappropriate.        Speech: Speech normal.        Behavior: Behavior normal.        Thought Content: Thought content normal. Thought content is not paranoid or delusional. Thought content does not include homicidal or suicidal ideation. Thought content does not include homicidal or suicidal plan.        Cognition and Memory: Cognition and memory normal.        Judgment: Judgment normal.     Comments: Insight intact     Lab Review:     Component Value Date/Time   NA 143 12/24/2020 0000   K 4.5 12/24/2020 0000   CL 105 12/24/2020 0000   CO2 28 12/24/2020 0000   GLUCOSE 93 12/24/2020 0000   BUN 27 (H) 12/24/2020 0000   CREATININE 1.11 (H) 12/24/2020 0000   CALCIUM 9.2 12/24/2020 0000   PROT 6.4 12/24/2020 0000   ALBUMIN 4.3 11/10/2016 0935   AST 16 12/24/2020 0000   ALT 17 12/24/2020 0000   ALKPHOS 80 11/10/2016 0935   BILITOT 0.3 12/24/2020 0000   GFRNONAA 48 (L) 12/24/2020 0000   GFRAA 55 (L) 12/24/2020 0000       Component Value Date/Time   WBC 8.7 12/24/2020 0000   RBC 4.37 12/24/2020 0000   HGB 13.5 12/24/2020 0000   HCT 40.7 12/24/2020 0000   PLT 324 12/24/2020 0000   MCV 93.1 12/24/2020 0000   MCH 30.9 12/24/2020 0000   MCHC 33.2 12/24/2020 0000   RDW 13.0 12/24/2020 0000   LYMPHSABS 1,224 09/23/2019 1455   MONOABS 399 11/10/2016 0935   EOSABS 12 (L) 09/23/2019 1455   BASOSABS  48 09/23/2019 1455    No results found for: POCLITH, LITHIUM   No results found for: PHENYTOIN, PHENOBARB, VALPROATE, CBMZ   .res Assessment: Plan:    Plan:  PDMP reviewed  1. Lexapro 20mg  daily 2. Wellbutrin XL 300mg  daily 3. Trazadone 100mg  at hs for sleep 4.  Hydroxyzine 20mg  TID anxiety 5. Xanax 0.25mg  BID 6. Buspar 20mg  BID  RTC 3 months  Patient advised to contact office with any questions, adverse effects, or acute worsening in signs and symptoms.  Discussed potential benefits, risk, and side effects of benzodiazepines to include potential risk of tolerance and dependence, as well as possible drowsiness.  Advised patient not to drive if experiencing drowsiness and to take lowest possible effective dose to minimize risk of dependence and tolerance.   There are no diagnoses linked to this encounter.   Please see After Visit Summary for patient specific instructions.  Future Appointments  Date Time Provider Department Center  02/22/2021  1:00 PM Agapito GamesMetheney, Catherine D, MD [facility] None    No orders of the defined types were placed in this encounter.     -------------------------------

## 2021-02-15 DIAGNOSIS — Z885 Allergy status to narcotic agent status: Secondary | ICD-10-CM | POA: Diagnosis not present

## 2021-02-15 DIAGNOSIS — Z888 Allergy status to other drugs, medicaments and biological substances status: Secondary | ICD-10-CM | POA: Diagnosis not present

## 2021-02-15 DIAGNOSIS — H25813 Combined forms of age-related cataract, bilateral: Secondary | ICD-10-CM | POA: Diagnosis not present

## 2021-02-15 DIAGNOSIS — I493 Ventricular premature depolarization: Secondary | ICD-10-CM | POA: Diagnosis not present

## 2021-02-15 DIAGNOSIS — Z79899 Other long term (current) drug therapy: Secondary | ICD-10-CM | POA: Diagnosis not present

## 2021-02-15 DIAGNOSIS — E785 Hyperlipidemia, unspecified: Secondary | ICD-10-CM | POA: Diagnosis not present

## 2021-02-15 DIAGNOSIS — H25811 Combined forms of age-related cataract, right eye: Secondary | ICD-10-CM | POA: Diagnosis not present

## 2021-02-15 DIAGNOSIS — K219 Gastro-esophageal reflux disease without esophagitis: Secondary | ICD-10-CM | POA: Diagnosis not present

## 2021-02-16 DIAGNOSIS — Z961 Presence of intraocular lens: Secondary | ICD-10-CM | POA: Diagnosis not present

## 2021-02-16 DIAGNOSIS — H25812 Combined forms of age-related cataract, left eye: Secondary | ICD-10-CM | POA: Diagnosis not present

## 2021-02-17 ENCOUNTER — Other Ambulatory Visit: Payer: Self-pay | Admitting: Family Medicine

## 2021-02-22 ENCOUNTER — Ambulatory Visit (INDEPENDENT_AMBULATORY_CARE_PROVIDER_SITE_OTHER): Payer: Medicare Other | Admitting: Family Medicine

## 2021-02-22 ENCOUNTER — Other Ambulatory Visit: Payer: Self-pay

## 2021-02-22 ENCOUNTER — Encounter: Payer: Self-pay | Admitting: Family Medicine

## 2021-02-22 VITALS — BP 135/63 | HR 95 | Ht 66.0 in | Wt 160.0 lb

## 2021-02-22 DIAGNOSIS — F411 Generalized anxiety disorder: Secondary | ICD-10-CM | POA: Diagnosis not present

## 2021-02-22 DIAGNOSIS — F339 Major depressive disorder, recurrent, unspecified: Secondary | ICD-10-CM | POA: Diagnosis not present

## 2021-02-22 DIAGNOSIS — E611 Iron deficiency: Secondary | ICD-10-CM | POA: Diagnosis not present

## 2021-02-22 MED ORDER — SERTRALINE HCL 100 MG PO TABS: 100.0000 mg | ORAL_TABLET | Freq: Every day | ORAL | 3 refills | Status: DC

## 2021-02-22 NOTE — Progress Notes (Signed)
Established Patient Office Visit  Subjective:  Patient ID: Carolyn Hendrix, female    DOB: 06-14-1941  Age: 80 y.o. MRN: [id number]  CC:  Chief Complaint  Patient presents with  . Follow-up    HPI [name] presents for 6 weeks follow up for medication change.  We changed to sertraline.  Thus far doing well on the medication.  Far she is tolerating the medication without any significant side effects she says Sunday for the first time in a really long time she felt a little bit more like her old self.  She is still on sertraline as well as trazodone for sleep.  He also wanted let me know that her pain management doctor has put her on methocarbamol up to twice a day as needed.  She is also noted to have low iron in March.  She said she did take an over-the-counter supplement for short period of time but then got nervous about it so stopped it.  Follow-up palpitations-she really has not had a lot of problems with her heart racing unless she starts to feel really anxious and overwhelmed.  She wonders if she still needs to take the metoprolol she is already stopped it on her own and feels like she is done well without it.  Past Medical History:  Diagnosis Date  . Chronic LBP    Lumbar DDD  . Colitis, ischemic (HCC)   . Depression   . Endometriosis   . GERD (gastroesophageal reflux disease)   . Hematuria   . Hyperlipidemia   . IBS (irritable bowel syndrome)    Fr Hurrelbrink- on Miralax and Citrucel daily  . Migraines   . MVP (mitral valve prolapse)     Past Surgical History:  Procedure Laterality Date  . ABDOMINAL HYSTERECTOMY  1991   w/ bilat oophorectomy for endometriosis  . APPENDECTOMY    . BREAST BIOPSY    . ESOPHAGOGASTRODUODENOSCOPY  06  . fused 5th digit right hand Right   . HEMILAMINOTOMY LUMBAR SPINE  11/18/1991   Dr. Lorain Childes  . lapartomy  1995  . OTHER SURGICAL HISTORY  1992, 1994  . right breast biopsy- benign    . SPINAL FUSION  08/18/97   Dr.  Dorita Fray at [facility], L4-5 post fusion with iliac crest bone graft  . TONSILLECTOMY AND ADENOIDECTOMY      Family History  Problem Relation Age of Onset  . Alzheimer's disease Mother   . Other Father        ACS? - 76's  . Ataxia Sister     Social History   Socioeconomic History  . Marital status: Widowed    Spouse name: [name]  . Number of children: 2  . Years of education: 80  . Highest education level: 12th grade  Occupational History  . Occupation: retired    Comment: Print production planner for city of [employer]  Tobacco Use  . Smoking status: Former Smoker    Types: Cigarettes    Quit date: 10/23/1980    Years since quitting: 40.3  . Smokeless tobacco: Never Used  Vaping Use  . Vaping Use: Never used  Substance and Sexual Activity  . Alcohol use: No  . Drug use: No  . Sexual activity: Not Currently  Other Topics Concern  . Not on file  Social History Narrative   Her husband passed away in 2020/10/27. She is getting better at taking care of herself as she had to take care of her husband  for the past 2.5 years. She lives alone with her dog.    Social Determinants of Health   Financial Resource Strain: Low Risk   . Difficulty of Paying Living Expenses: Not hard at all  Food Insecurity: No Food Insecurity  . Worried About Programme researcher, broadcasting/film/video in the Last Year: Never true  . Ran Out of Food in the Last Year: Never true  Transportation Needs: No Transportation Needs  . Lack of Transportation (Medical): No  . Lack of Transportation (Non-Medical): No  Physical Activity: Inactive  . Days of Exercise per Week: 0 days  . Minutes of Exercise per Session: 0 min  Stress: No Stress Concern Present  . Feeling of Stress : Not at all  Social Connections: Moderately Isolated  . Frequency of Communication with Friends and Family: More than three times a week  . Frequency of Social Gatherings with Friends and Family: Never  . Attends Religious Services: 1 to 4 times per year  .  Active Member of Clubs or Organizations: No  . Attends Banker Meetings: Never  . Marital Status: Widowed  Intimate Partner Violence: Not At Risk  . Fear of Current or Ex-Partner: No  . Emotionally Abused: No  . Physically Abused: No  . Sexually Abused: No    Outpatient Medications Prior to Visit  Medication Sig Dispense Refill  . acyclovir ointment (ZOVIRAX) 5 % Apply 1 application topically every 3 (three) hours. 5 g 2  . ALPRAZolam (XANAX) 0.25 MG tablet TAKE 1 TABLET BY MOUTH TWICE A DAY AS NEEDED FOR ANXIETY 60 tablet 0  . buPROPion (WELLBUTRIN XL) 300 MG 24 hr tablet TAKE 1 TABLET BY MOUTH EVERY DAY 90 tablet 1  . Cholecalciferol (VITAMIN D3) 25 MCG (1000 UT) CAPS 1 capsule    . dicyclomine (BENTYL) 10 MG capsule TAKE 1 CAPSULE BY MOUTH THREE TIMES A DAY AS NEEDED FOR SPASMS 270 capsule 0  . glucosamine-chondroitin 500-400 MG tablet Take 1 tablet by mouth 2 (two) times daily.    . hydrOXYzine (ATARAX/VISTARIL) 10 MG tablet TAKE 1-2 TABLETS (10-20 MG TOTAL) BY MOUTH 3 (THREE) TIMES DAILY AS NEEDED FOR ANXIETY. 360 tablet 0  . Kava, Piper methysticum, (KAVA KAVA) 200 MG CAPS See admin instructions.    . Magnesium 300 MG CAPS 1 capsule with a meal    . methocarbamol (ROBAXIN) 750 MG tablet Take 1 tablet by mouth every 12 (twelve) hours.    . Multiple Vitamins-Minerals (SENTRY) TABS See admin instructions.    Marland Kitchen omeprazole (PRILOSEC) 20 MG capsule TAKE 1 CAPSULE BY MOUTH EVERY DAY 90 capsule 3  . pravastatin (PRAVACHOL) 40 MG tablet TAKE 1 TABLET BY MOUTH EVERY DAY (Patient taking differently: Patient had not been taking it everyday but she stated that she will now.) 90 tablet 3  . Probiotic Product (PROBIOTIC-10 PO) See admin instructions.    . pseudoephedrine (SUDAFED) 60 MG tablet 1 tablet as needed    . traMADol (ULTRAM) 50 MG tablet TAKE 1 TO 2 TABLETS BY MOUTH 3 TIMES A DAY AS NEEDED FOR PAIN  1  . traZODone (DESYREL) 100 MG tablet TAKE 1 TABLET BY MOUTH EVERYDAY AT  BEDTIME 90 tablet 0  . TURMERIC PO Take by mouth daily.    . sertraline (ZOLOFT) 50 MG tablet Take 1 tablet (50 mg total) by mouth daily. Needs appointment. 30 tablet 0  . baclofen (LIORESAL) 10 MG tablet TAKE 1 TABLET BY MOUTH AT BEDTIME AS NEEDED FOR MUSCLE SPASMS. 30  tablet 1  . escitalopram (LEXAPRO) 5 MG tablet Take 1 tablet (5 mg total) by mouth daily. 8 tablet 0  . metoprolol succinate (TOPROL-XL) 25 MG 24 hr tablet Take 25 mg by mouth daily.    . naloxone (NARCAN) nasal spray 4 mg/0.1 mL Place into the nose.     No facility-administered medications prior to visit.    Allergies  Allergen Reactions  . Cymbalta [Duloxetine Hcl] Diarrhea  . Morphine Hives    ROS Review of Systems    Objective:    Physical Exam Constitutional:      Appearance: She is well-developed.  HENT:     Head: Normocephalic and atraumatic.  Cardiovascular:     Rate and Rhythm: Normal rate and regular rhythm.     Heart sounds: Normal heart sounds.  Pulmonary:     Effort: Pulmonary effort is normal.     Breath sounds: Normal breath sounds.  Skin:    General: Skin is warm and dry.  Neurological:     Mental Status: She is alert and oriented to person, place, and time.  Psychiatric:        Behavior: Behavior normal.     BP 135/63   Pulse 95   Ht 5\' 6"  (1.676 m)   Wt 160 lb (72.6 kg)   SpO2 96%   BMI 25.82 kg/m  Wt Readings from Last 3 Encounters:  02/22/21 160 lb (72.6 kg)  12/23/20 153 lb (69.4 kg)  08/10/20 157 lb (71.2 kg)     There are no preventive care reminders to display for this patient.  There are no preventive care reminders to display for this patient.  Lab Results  Component Value Date   TSH 1.81 11/16/2017   Lab Results  Component Value Date   WBC 8.7 12/24/2020   HGB 13.5 12/24/2020   HCT 40.7 12/24/2020   MCV 93.1 12/24/2020   PLT 324 12/24/2020   Lab Results  Component Value Date   NA 143 12/24/2020   K 4.5 12/24/2020   CO2 28 12/24/2020   GLUCOSE 93  12/24/2020   BUN 27 (H) 12/24/2020   CREATININE 1.11 (H) 12/24/2020   BILITOT 0.3 12/24/2020   ALKPHOS 80 11/10/2016   AST 16 12/24/2020   ALT 17 12/24/2020   PROT 6.4 12/24/2020   ALBUMIN 4.3 11/10/2016   CALCIUM 9.2 12/24/2020   Lab Results  Component Value Date   CHOL 179 12/24/2020   Lab Results  Component Value Date   HDL 47 (L) 12/24/2020   Lab Results  Component Value Date   LDLCALC 109 (H) 12/24/2020   Lab Results  Component Value Date   TRIG 122 12/24/2020   Lab Results  Component Value Date   CHOLHDL 3.8 12/24/2020   No results found for: HGBA1C    Assessment & Plan:   Problem List Items Addressed This Visit      Other   Major depression, recurrent, chronic (HCC)    Discussed patient her medications.  She does feel like the sertraline has actually been really helpful so we discussed maybe increasing the dose to 100 mg and giving that a trial.  If she is doing really well we could even consider decreasing her Wellbutrin.  Continue to use the alprazolam sparingly it was filled earlier this month.  She gets this prescription from Dr. Kallie LocksMozingo.      Relevant Medications   sertraline (ZOLOFT) 100 MG tablet   Low iron - Primary    Due to  recheck iron levels.      Relevant Orders   Fe+TIBC+Fer   Anxiety state   Relevant Medications   sertraline (ZOLOFT) 100 MG tablet      Palpitations-okay to discontinue metoprolol but if she feels like she is starting to get palpitations again or skipping or jumping of the heart and please let us know we can always restart it if needed.  I think a lot of this was anxiety driven.  Meds ordered this encounter  Medications  . sertraline (ZOLOFT) 100 MG tablet    Sig: Take 1 tablet (100 mg total) by mouth daily.    Dispense:  30 tablet    Refill:  3    Follow-up: Return in about 3 months (around 05/25/2021) for Mood.    Nani Gasser, MD

## 2021-02-22 NOTE — Assessment & Plan Note (Signed)
Due to recheck iron levels. 

## 2021-02-22 NOTE — Assessment & Plan Note (Signed)
Discussed patient her medications.  She does feel like the sertraline has actually been really helpful so we discussed maybe increasing the dose to 100 mg and giving that a trial.  If she is doing really well we could even consider decreasing her Wellbutrin.  Continue to use the alprazolam sparingly it was filled earlier this month.  She gets this prescription from Dr. Kallie Locks.

## 2021-02-22 NOTE — Patient Instructions (Signed)
Going to increase her sertraline to 100 mg.  New prescription sent to pharmacy I like to see back in about 3 months.  If you are doing well at that point we might even try to decrease your Wellbutrin.

## 2021-02-24 ENCOUNTER — Other Ambulatory Visit: Payer: Self-pay | Admitting: Family Medicine

## 2021-02-25 ENCOUNTER — Other Ambulatory Visit: Payer: Self-pay | Admitting: Family Medicine

## 2021-03-08 DIAGNOSIS — M1990 Unspecified osteoarthritis, unspecified site: Secondary | ICD-10-CM | POA: Diagnosis not present

## 2021-03-08 DIAGNOSIS — I493 Ventricular premature depolarization: Secondary | ICD-10-CM | POA: Diagnosis not present

## 2021-03-08 DIAGNOSIS — H527 Unspecified disorder of refraction: Secondary | ICD-10-CM | POA: Diagnosis not present

## 2021-03-08 DIAGNOSIS — Z961 Presence of intraocular lens: Secondary | ICD-10-CM | POA: Diagnosis not present

## 2021-03-08 DIAGNOSIS — K219 Gastro-esophageal reflux disease without esophagitis: Secondary | ICD-10-CM | POA: Diagnosis not present

## 2021-03-08 DIAGNOSIS — Z79899 Other long term (current) drug therapy: Secondary | ICD-10-CM | POA: Diagnosis not present

## 2021-03-08 DIAGNOSIS — Z87891 Personal history of nicotine dependence: Secondary | ICD-10-CM | POA: Diagnosis not present

## 2021-03-08 DIAGNOSIS — H25812 Combined forms of age-related cataract, left eye: Secondary | ICD-10-CM | POA: Diagnosis not present

## 2021-03-09 DIAGNOSIS — Z961 Presence of intraocular lens: Secondary | ICD-10-CM | POA: Diagnosis not present

## 2021-03-14 DIAGNOSIS — M47816 Spondylosis without myelopathy or radiculopathy, lumbar region: Secondary | ICD-10-CM | POA: Diagnosis not present

## 2021-03-14 DIAGNOSIS — M961 Postlaminectomy syndrome, not elsewhere classified: Secondary | ICD-10-CM | POA: Diagnosis not present

## 2021-03-14 DIAGNOSIS — G894 Chronic pain syndrome: Secondary | ICD-10-CM | POA: Diagnosis not present

## 2021-03-14 DIAGNOSIS — Z79891 Long term (current) use of opiate analgesic: Secondary | ICD-10-CM | POA: Diagnosis not present

## 2021-03-14 DIAGNOSIS — M5136 Other intervertebral disc degeneration, lumbar region: Secondary | ICD-10-CM | POA: Diagnosis not present

## 2021-03-20 ENCOUNTER — Other Ambulatory Visit: Payer: Self-pay | Admitting: Family Medicine

## 2021-03-21 ENCOUNTER — Other Ambulatory Visit: Payer: Self-pay | Admitting: Adult Health

## 2021-03-21 DIAGNOSIS — F411 Generalized anxiety disorder: Secondary | ICD-10-CM

## 2021-03-22 DIAGNOSIS — S299XXA Unspecified injury of thorax, initial encounter: Secondary | ICD-10-CM | POA: Diagnosis not present

## 2021-03-22 DIAGNOSIS — Z043 Encounter for examination and observation following other accident: Secondary | ICD-10-CM | POA: Diagnosis not present

## 2021-03-22 DIAGNOSIS — K5730 Diverticulosis of large intestine without perforation or abscess without bleeding: Secondary | ICD-10-CM | POA: Diagnosis not present

## 2021-03-22 DIAGNOSIS — W19XXXA Unspecified fall, initial encounter: Secondary | ICD-10-CM | POA: Diagnosis not present

## 2021-03-22 DIAGNOSIS — Z7951 Long term (current) use of inhaled steroids: Secondary | ICD-10-CM | POA: Diagnosis not present

## 2021-03-22 DIAGNOSIS — R0781 Pleurodynia: Secondary | ICD-10-CM | POA: Diagnosis not present

## 2021-03-22 DIAGNOSIS — Z23 Encounter for immunization: Secondary | ICD-10-CM | POA: Diagnosis not present

## 2021-03-22 DIAGNOSIS — M5441 Lumbago with sciatica, right side: Secondary | ICD-10-CM | POA: Diagnosis not present

## 2021-03-22 DIAGNOSIS — Z888 Allergy status to other drugs, medicaments and biological substances status: Secondary | ICD-10-CM | POA: Diagnosis not present

## 2021-03-22 DIAGNOSIS — S32509A Unspecified fracture of unspecified pubis, initial encounter for closed fracture: Secondary | ICD-10-CM | POA: Diagnosis not present

## 2021-03-22 DIAGNOSIS — G894 Chronic pain syndrome: Secondary | ICD-10-CM | POA: Diagnosis not present

## 2021-03-22 DIAGNOSIS — N3000 Acute cystitis without hematuria: Secondary | ICD-10-CM | POA: Diagnosis not present

## 2021-03-22 DIAGNOSIS — R42 Dizziness and giddiness: Secondary | ICD-10-CM | POA: Diagnosis not present

## 2021-03-22 DIAGNOSIS — Z79899 Other long term (current) drug therapy: Secondary | ICD-10-CM | POA: Diagnosis not present

## 2021-03-22 DIAGNOSIS — Z885 Allergy status to narcotic agent status: Secondary | ICD-10-CM | POA: Diagnosis not present

## 2021-03-22 DIAGNOSIS — S2249XA Multiple fractures of ribs, unspecified side, initial encounter for closed fracture: Secondary | ICD-10-CM | POA: Diagnosis not present

## 2021-03-22 DIAGNOSIS — G8911 Acute pain due to trauma: Secondary | ICD-10-CM | POA: Diagnosis not present

## 2021-03-22 DIAGNOSIS — N200 Calculus of kidney: Secondary | ICD-10-CM | POA: Diagnosis not present

## 2021-03-22 DIAGNOSIS — R0902 Hypoxemia: Secondary | ICD-10-CM | POA: Diagnosis not present

## 2021-03-22 DIAGNOSIS — I959 Hypotension, unspecified: Secondary | ICD-10-CM | POA: Diagnosis not present

## 2021-03-22 DIAGNOSIS — S2241XA Multiple fractures of ribs, right side, initial encounter for closed fracture: Secondary | ICD-10-CM | POA: Diagnosis not present

## 2021-03-22 DIAGNOSIS — Z87891 Personal history of nicotine dependence: Secondary | ICD-10-CM | POA: Diagnosis not present

## 2021-03-22 DIAGNOSIS — R109 Unspecified abdominal pain: Secondary | ICD-10-CM | POA: Diagnosis not present

## 2021-03-22 DIAGNOSIS — E785 Hyperlipidemia, unspecified: Secondary | ICD-10-CM | POA: Diagnosis not present

## 2021-03-22 DIAGNOSIS — S32501A Unspecified fracture of right pubis, initial encounter for closed fracture: Secondary | ICD-10-CM | POA: Diagnosis not present

## 2021-03-22 DIAGNOSIS — S32511A Fracture of superior rim of right pubis, initial encounter for closed fracture: Secondary | ICD-10-CM | POA: Diagnosis not present

## 2021-03-22 DIAGNOSIS — Z9181 History of falling: Secondary | ICD-10-CM | POA: Diagnosis not present

## 2021-03-22 DIAGNOSIS — Z8679 Personal history of other diseases of the circulatory system: Secondary | ICD-10-CM | POA: Diagnosis not present

## 2021-03-23 DIAGNOSIS — Z87891 Personal history of nicotine dependence: Secondary | ICD-10-CM | POA: Diagnosis not present

## 2021-03-23 DIAGNOSIS — Z79899 Other long term (current) drug therapy: Secondary | ICD-10-CM | POA: Diagnosis not present

## 2021-03-23 DIAGNOSIS — E785 Hyperlipidemia, unspecified: Secondary | ICD-10-CM | POA: Diagnosis not present

## 2021-03-23 DIAGNOSIS — S329XXA Fracture of unspecified parts of lumbosacral spine and pelvis, initial encounter for closed fracture: Secondary | ICD-10-CM | POA: Diagnosis not present

## 2021-03-23 DIAGNOSIS — M5441 Lumbago with sciatica, right side: Secondary | ICD-10-CM | POA: Diagnosis not present

## 2021-03-23 DIAGNOSIS — S2249XA Multiple fractures of ribs, unspecified side, initial encounter for closed fracture: Secondary | ICD-10-CM | POA: Diagnosis not present

## 2021-03-23 DIAGNOSIS — Z7951 Long term (current) use of inhaled steroids: Secondary | ICD-10-CM | POA: Diagnosis not present

## 2021-03-23 DIAGNOSIS — Z8679 Personal history of other diseases of the circulatory system: Secondary | ICD-10-CM | POA: Diagnosis not present

## 2021-03-23 DIAGNOSIS — W19XXXA Unspecified fall, initial encounter: Secondary | ICD-10-CM | POA: Diagnosis not present

## 2021-03-23 DIAGNOSIS — Z9181 History of falling: Secondary | ICD-10-CM | POA: Diagnosis not present

## 2021-03-23 DIAGNOSIS — N3000 Acute cystitis without hematuria: Secondary | ICD-10-CM | POA: Diagnosis not present

## 2021-03-23 DIAGNOSIS — S2241XA Multiple fractures of ribs, right side, initial encounter for closed fracture: Secondary | ICD-10-CM | POA: Diagnosis not present

## 2021-03-23 DIAGNOSIS — Z23 Encounter for immunization: Secondary | ICD-10-CM | POA: Diagnosis not present

## 2021-03-23 DIAGNOSIS — G894 Chronic pain syndrome: Secondary | ICD-10-CM | POA: Diagnosis not present

## 2021-03-23 DIAGNOSIS — Z888 Allergy status to other drugs, medicaments and biological substances status: Secondary | ICD-10-CM | POA: Diagnosis not present

## 2021-03-23 DIAGNOSIS — Z885 Allergy status to narcotic agent status: Secondary | ICD-10-CM | POA: Diagnosis not present

## 2021-03-23 DIAGNOSIS — S32501A Unspecified fracture of right pubis, initial encounter for closed fracture: Secondary | ICD-10-CM | POA: Diagnosis not present

## 2021-03-23 DIAGNOSIS — S32509A Unspecified fracture of unspecified pubis, initial encounter for closed fracture: Secondary | ICD-10-CM | POA: Diagnosis not present

## 2021-03-23 NOTE — Telephone Encounter (Signed)
Last filled 02/03/21

## 2021-03-24 DIAGNOSIS — Z79899 Other long term (current) drug therapy: Secondary | ICD-10-CM | POA: Diagnosis not present

## 2021-03-24 DIAGNOSIS — Z87891 Personal history of nicotine dependence: Secondary | ICD-10-CM | POA: Diagnosis not present

## 2021-03-24 DIAGNOSIS — Z888 Allergy status to other drugs, medicaments and biological substances status: Secondary | ICD-10-CM | POA: Diagnosis not present

## 2021-03-24 DIAGNOSIS — S2249XA Multiple fractures of ribs, unspecified side, initial encounter for closed fracture: Secondary | ICD-10-CM | POA: Diagnosis not present

## 2021-03-24 DIAGNOSIS — N3000 Acute cystitis without hematuria: Secondary | ICD-10-CM | POA: Diagnosis not present

## 2021-03-24 DIAGNOSIS — Z8679 Personal history of other diseases of the circulatory system: Secondary | ICD-10-CM | POA: Diagnosis not present

## 2021-03-24 DIAGNOSIS — Z7951 Long term (current) use of inhaled steroids: Secondary | ICD-10-CM | POA: Diagnosis not present

## 2021-03-24 DIAGNOSIS — Z885 Allergy status to narcotic agent status: Secondary | ICD-10-CM | POA: Diagnosis not present

## 2021-03-24 DIAGNOSIS — Z9181 History of falling: Secondary | ICD-10-CM | POA: Diagnosis not present

## 2021-03-24 DIAGNOSIS — G894 Chronic pain syndrome: Secondary | ICD-10-CM | POA: Diagnosis not present

## 2021-03-24 DIAGNOSIS — M5441 Lumbago with sciatica, right side: Secondary | ICD-10-CM | POA: Diagnosis not present

## 2021-03-24 DIAGNOSIS — W19XXXA Unspecified fall, initial encounter: Secondary | ICD-10-CM | POA: Diagnosis not present

## 2021-03-24 DIAGNOSIS — E785 Hyperlipidemia, unspecified: Secondary | ICD-10-CM | POA: Diagnosis not present

## 2021-03-24 DIAGNOSIS — Z23 Encounter for immunization: Secondary | ICD-10-CM | POA: Diagnosis not present

## 2021-03-24 DIAGNOSIS — S32509A Unspecified fracture of unspecified pubis, initial encounter for closed fracture: Secondary | ICD-10-CM | POA: Diagnosis not present

## 2021-03-24 DIAGNOSIS — S2241XA Multiple fractures of ribs, right side, initial encounter for closed fracture: Secondary | ICD-10-CM | POA: Diagnosis not present

## 2021-03-24 DIAGNOSIS — S32501A Unspecified fracture of right pubis, initial encounter for closed fracture: Secondary | ICD-10-CM | POA: Diagnosis not present

## 2021-03-25 DIAGNOSIS — S2249XA Multiple fractures of ribs, unspecified side, initial encounter for closed fracture: Secondary | ICD-10-CM | POA: Diagnosis not present

## 2021-03-25 DIAGNOSIS — Z7951 Long term (current) use of inhaled steroids: Secondary | ICD-10-CM | POA: Diagnosis not present

## 2021-03-25 DIAGNOSIS — Z23 Encounter for immunization: Secondary | ICD-10-CM | POA: Diagnosis not present

## 2021-03-25 DIAGNOSIS — S32501A Unspecified fracture of right pubis, initial encounter for closed fracture: Secondary | ICD-10-CM | POA: Diagnosis not present

## 2021-03-25 DIAGNOSIS — N3000 Acute cystitis without hematuria: Secondary | ICD-10-CM | POA: Diagnosis not present

## 2021-03-25 DIAGNOSIS — Z885 Allergy status to narcotic agent status: Secondary | ICD-10-CM | POA: Diagnosis not present

## 2021-03-25 DIAGNOSIS — W19XXXA Unspecified fall, initial encounter: Secondary | ICD-10-CM | POA: Diagnosis not present

## 2021-03-25 DIAGNOSIS — G894 Chronic pain syndrome: Secondary | ICD-10-CM | POA: Diagnosis not present

## 2021-03-25 DIAGNOSIS — E785 Hyperlipidemia, unspecified: Secondary | ICD-10-CM | POA: Diagnosis not present

## 2021-03-25 DIAGNOSIS — Z79899 Other long term (current) drug therapy: Secondary | ICD-10-CM | POA: Diagnosis not present

## 2021-03-25 DIAGNOSIS — M5441 Lumbago with sciatica, right side: Secondary | ICD-10-CM | POA: Diagnosis not present

## 2021-03-25 DIAGNOSIS — S32509A Unspecified fracture of unspecified pubis, initial encounter for closed fracture: Secondary | ICD-10-CM | POA: Diagnosis not present

## 2021-03-25 DIAGNOSIS — Z87891 Personal history of nicotine dependence: Secondary | ICD-10-CM | POA: Diagnosis not present

## 2021-03-25 DIAGNOSIS — Z8679 Personal history of other diseases of the circulatory system: Secondary | ICD-10-CM | POA: Diagnosis not present

## 2021-03-25 DIAGNOSIS — Z888 Allergy status to other drugs, medicaments and biological substances status: Secondary | ICD-10-CM | POA: Diagnosis not present

## 2021-03-25 DIAGNOSIS — Z9181 History of falling: Secondary | ICD-10-CM | POA: Diagnosis not present

## 2021-03-25 DIAGNOSIS — S2241XA Multiple fractures of ribs, right side, initial encounter for closed fracture: Secondary | ICD-10-CM | POA: Diagnosis not present

## 2021-03-26 DIAGNOSIS — E785 Hyperlipidemia, unspecified: Secondary | ICD-10-CM | POA: Diagnosis not present

## 2021-03-26 DIAGNOSIS — Z23 Encounter for immunization: Secondary | ICD-10-CM | POA: Diagnosis not present

## 2021-03-26 DIAGNOSIS — Z885 Allergy status to narcotic agent status: Secondary | ICD-10-CM | POA: Diagnosis not present

## 2021-03-26 DIAGNOSIS — S32509A Unspecified fracture of unspecified pubis, initial encounter for closed fracture: Secondary | ICD-10-CM | POA: Diagnosis not present

## 2021-03-26 DIAGNOSIS — N3000 Acute cystitis without hematuria: Secondary | ICD-10-CM | POA: Diagnosis not present

## 2021-03-26 DIAGNOSIS — Z9181 History of falling: Secondary | ICD-10-CM | POA: Diagnosis not present

## 2021-03-26 DIAGNOSIS — Z8679 Personal history of other diseases of the circulatory system: Secondary | ICD-10-CM | POA: Diagnosis not present

## 2021-03-26 DIAGNOSIS — G894 Chronic pain syndrome: Secondary | ICD-10-CM | POA: Diagnosis not present

## 2021-03-26 DIAGNOSIS — Z79899 Other long term (current) drug therapy: Secondary | ICD-10-CM | POA: Diagnosis not present

## 2021-03-26 DIAGNOSIS — S2249XA Multiple fractures of ribs, unspecified side, initial encounter for closed fracture: Secondary | ICD-10-CM | POA: Diagnosis not present

## 2021-03-26 DIAGNOSIS — M5441 Lumbago with sciatica, right side: Secondary | ICD-10-CM | POA: Diagnosis not present

## 2021-03-26 DIAGNOSIS — Z888 Allergy status to other drugs, medicaments and biological substances status: Secondary | ICD-10-CM | POA: Diagnosis not present

## 2021-03-26 DIAGNOSIS — W19XXXA Unspecified fall, initial encounter: Secondary | ICD-10-CM | POA: Diagnosis not present

## 2021-03-26 DIAGNOSIS — S2241XA Multiple fractures of ribs, right side, initial encounter for closed fracture: Secondary | ICD-10-CM | POA: Diagnosis not present

## 2021-03-26 DIAGNOSIS — Z7951 Long term (current) use of inhaled steroids: Secondary | ICD-10-CM | POA: Diagnosis not present

## 2021-03-26 DIAGNOSIS — S32501A Unspecified fracture of right pubis, initial encounter for closed fracture: Secondary | ICD-10-CM | POA: Diagnosis not present

## 2021-03-26 DIAGNOSIS — Z87891 Personal history of nicotine dependence: Secondary | ICD-10-CM | POA: Diagnosis not present

## 2021-03-27 DIAGNOSIS — Z7951 Long term (current) use of inhaled steroids: Secondary | ICD-10-CM | POA: Diagnosis not present

## 2021-03-27 DIAGNOSIS — N3000 Acute cystitis without hematuria: Secondary | ICD-10-CM | POA: Diagnosis not present

## 2021-03-27 DIAGNOSIS — E785 Hyperlipidemia, unspecified: Secondary | ICD-10-CM | POA: Diagnosis not present

## 2021-03-27 DIAGNOSIS — S2249XA Multiple fractures of ribs, unspecified side, initial encounter for closed fracture: Secondary | ICD-10-CM | POA: Diagnosis not present

## 2021-03-27 DIAGNOSIS — Z23 Encounter for immunization: Secondary | ICD-10-CM | POA: Diagnosis not present

## 2021-03-27 DIAGNOSIS — S32501A Unspecified fracture of right pubis, initial encounter for closed fracture: Secondary | ICD-10-CM | POA: Diagnosis not present

## 2021-03-27 DIAGNOSIS — G894 Chronic pain syndrome: Secondary | ICD-10-CM | POA: Diagnosis not present

## 2021-03-27 DIAGNOSIS — Z888 Allergy status to other drugs, medicaments and biological substances status: Secondary | ICD-10-CM | POA: Diagnosis not present

## 2021-03-27 DIAGNOSIS — S32509A Unspecified fracture of unspecified pubis, initial encounter for closed fracture: Secondary | ICD-10-CM | POA: Diagnosis not present

## 2021-03-27 DIAGNOSIS — Z885 Allergy status to narcotic agent status: Secondary | ICD-10-CM | POA: Diagnosis not present

## 2021-03-27 DIAGNOSIS — Z79899 Other long term (current) drug therapy: Secondary | ICD-10-CM | POA: Diagnosis not present

## 2021-03-27 DIAGNOSIS — Z87891 Personal history of nicotine dependence: Secondary | ICD-10-CM | POA: Diagnosis not present

## 2021-03-27 DIAGNOSIS — W19XXXA Unspecified fall, initial encounter: Secondary | ICD-10-CM | POA: Diagnosis not present

## 2021-03-27 DIAGNOSIS — Z8679 Personal history of other diseases of the circulatory system: Secondary | ICD-10-CM | POA: Diagnosis not present

## 2021-03-27 DIAGNOSIS — M5441 Lumbago with sciatica, right side: Secondary | ICD-10-CM | POA: Diagnosis not present

## 2021-03-27 DIAGNOSIS — Z9181 History of falling: Secondary | ICD-10-CM | POA: Diagnosis not present

## 2021-03-27 DIAGNOSIS — S2241XA Multiple fractures of ribs, right side, initial encounter for closed fracture: Secondary | ICD-10-CM | POA: Diagnosis not present

## 2021-03-28 DIAGNOSIS — E785 Hyperlipidemia, unspecified: Secondary | ICD-10-CM | POA: Diagnosis not present

## 2021-03-28 DIAGNOSIS — N3000 Acute cystitis without hematuria: Secondary | ICD-10-CM | POA: Diagnosis not present

## 2021-03-28 DIAGNOSIS — R262 Difficulty in walking, not elsewhere classified: Secondary | ICD-10-CM | POA: Diagnosis not present

## 2021-03-28 DIAGNOSIS — S32509A Unspecified fracture of unspecified pubis, initial encounter for closed fracture: Secondary | ICD-10-CM | POA: Diagnosis not present

## 2021-03-28 DIAGNOSIS — M1990 Unspecified osteoarthritis, unspecified site: Secondary | ICD-10-CM | POA: Diagnosis not present

## 2021-03-28 DIAGNOSIS — I10 Essential (primary) hypertension: Secondary | ICD-10-CM | POA: Diagnosis not present

## 2021-03-28 DIAGNOSIS — Z888 Allergy status to other drugs, medicaments and biological substances status: Secondary | ICD-10-CM | POA: Diagnosis not present

## 2021-03-28 DIAGNOSIS — K589 Irritable bowel syndrome without diarrhea: Secondary | ICD-10-CM | POA: Diagnosis not present

## 2021-03-28 DIAGNOSIS — G4700 Insomnia, unspecified: Secondary | ICD-10-CM | POA: Diagnosis not present

## 2021-03-28 DIAGNOSIS — Z87891 Personal history of nicotine dependence: Secondary | ICD-10-CM | POA: Diagnosis not present

## 2021-03-28 DIAGNOSIS — Z23 Encounter for immunization: Secondary | ICD-10-CM | POA: Diagnosis not present

## 2021-03-28 DIAGNOSIS — R5381 Other malaise: Secondary | ICD-10-CM | POA: Diagnosis not present

## 2021-03-28 DIAGNOSIS — G8929 Other chronic pain: Secondary | ICD-10-CM | POA: Diagnosis not present

## 2021-03-28 DIAGNOSIS — M25551 Pain in right hip: Secondary | ICD-10-CM | POA: Diagnosis not present

## 2021-03-28 DIAGNOSIS — Z8679 Personal history of other diseases of the circulatory system: Secondary | ICD-10-CM | POA: Diagnosis not present

## 2021-03-28 DIAGNOSIS — S3282XD Multiple fractures of pelvis without disruption of pelvic ring, subsequent encounter for fracture with routine healing: Secondary | ICD-10-CM | POA: Diagnosis not present

## 2021-03-28 DIAGNOSIS — Z7951 Long term (current) use of inhaled steroids: Secondary | ICD-10-CM | POA: Diagnosis not present

## 2021-03-28 DIAGNOSIS — S2241XD Multiple fractures of ribs, right side, subsequent encounter for fracture with routine healing: Secondary | ICD-10-CM | POA: Diagnosis not present

## 2021-03-28 DIAGNOSIS — Z9689 Presence of other specified functional implants: Secondary | ICD-10-CM | POA: Diagnosis not present

## 2021-03-28 DIAGNOSIS — S2241XA Multiple fractures of ribs, right side, initial encounter for closed fracture: Secondary | ICD-10-CM | POA: Diagnosis not present

## 2021-03-28 DIAGNOSIS — Z79899 Other long term (current) drug therapy: Secondary | ICD-10-CM | POA: Diagnosis not present

## 2021-03-28 DIAGNOSIS — R2681 Unsteadiness on feet: Secondary | ICD-10-CM | POA: Diagnosis not present

## 2021-03-28 DIAGNOSIS — S32501A Unspecified fracture of right pubis, initial encounter for closed fracture: Secondary | ICD-10-CM | POA: Diagnosis not present

## 2021-03-28 DIAGNOSIS — M5441 Lumbago with sciatica, right side: Secondary | ICD-10-CM | POA: Diagnosis not present

## 2021-03-28 DIAGNOSIS — G894 Chronic pain syndrome: Secondary | ICD-10-CM | POA: Diagnosis not present

## 2021-03-28 DIAGNOSIS — S2249XA Multiple fractures of ribs, unspecified side, initial encounter for closed fracture: Secondary | ICD-10-CM | POA: Diagnosis not present

## 2021-03-28 DIAGNOSIS — Z9181 History of falling: Secondary | ICD-10-CM | POA: Diagnosis not present

## 2021-03-28 DIAGNOSIS — Z885 Allergy status to narcotic agent status: Secondary | ICD-10-CM | POA: Diagnosis not present

## 2021-03-28 DIAGNOSIS — K219 Gastro-esophageal reflux disease without esophagitis: Secondary | ICD-10-CM | POA: Diagnosis not present

## 2021-03-28 DIAGNOSIS — S32511D Fracture of superior rim of right pubis, subsequent encounter for fracture with routine healing: Secondary | ICD-10-CM | POA: Diagnosis not present

## 2021-03-28 DIAGNOSIS — F32A Depression, unspecified: Secondary | ICD-10-CM | POA: Diagnosis not present

## 2021-03-28 DIAGNOSIS — W19XXXA Unspecified fall, initial encounter: Secondary | ICD-10-CM | POA: Diagnosis not present

## 2021-03-28 DIAGNOSIS — Z9682 Presence of neurostimulator: Secondary | ICD-10-CM | POA: Diagnosis not present

## 2021-03-29 DIAGNOSIS — G4700 Insomnia, unspecified: Secondary | ICD-10-CM | POA: Diagnosis not present

## 2021-03-29 DIAGNOSIS — G8929 Other chronic pain: Secondary | ICD-10-CM | POA: Diagnosis not present

## 2021-03-29 DIAGNOSIS — G894 Chronic pain syndrome: Secondary | ICD-10-CM | POA: Diagnosis not present

## 2021-03-29 DIAGNOSIS — E785 Hyperlipidemia, unspecified: Secondary | ICD-10-CM | POA: Diagnosis not present

## 2021-03-29 DIAGNOSIS — Z9181 History of falling: Secondary | ICD-10-CM | POA: Diagnosis not present

## 2021-03-29 DIAGNOSIS — M5441 Lumbago with sciatica, right side: Secondary | ICD-10-CM | POA: Diagnosis not present

## 2021-03-29 DIAGNOSIS — S2241XD Multiple fractures of ribs, right side, subsequent encounter for fracture with routine healing: Secondary | ICD-10-CM | POA: Diagnosis not present

## 2021-03-29 DIAGNOSIS — F32A Depression, unspecified: Secondary | ICD-10-CM | POA: Diagnosis not present

## 2021-03-29 DIAGNOSIS — Z9689 Presence of other specified functional implants: Secondary | ICD-10-CM | POA: Diagnosis not present

## 2021-03-30 ENCOUNTER — Other Ambulatory Visit: Payer: Self-pay | Admitting: Family Medicine

## 2021-03-30 DIAGNOSIS — R5381 Other malaise: Secondary | ICD-10-CM | POA: Diagnosis not present

## 2021-03-30 DIAGNOSIS — G894 Chronic pain syndrome: Secondary | ICD-10-CM | POA: Diagnosis not present

## 2021-03-30 DIAGNOSIS — F32A Depression, unspecified: Secondary | ICD-10-CM | POA: Diagnosis not present

## 2021-03-31 DIAGNOSIS — S32511D Fracture of superior rim of right pubis, subsequent encounter for fracture with routine healing: Secondary | ICD-10-CM | POA: Diagnosis not present

## 2021-03-31 DIAGNOSIS — Z9689 Presence of other specified functional implants: Secondary | ICD-10-CM | POA: Diagnosis not present

## 2021-03-31 DIAGNOSIS — G8929 Other chronic pain: Secondary | ICD-10-CM | POA: Diagnosis not present

## 2021-03-31 DIAGNOSIS — S2241XD Multiple fractures of ribs, right side, subsequent encounter for fracture with routine healing: Secondary | ICD-10-CM | POA: Diagnosis not present

## 2021-03-31 DIAGNOSIS — M5441 Lumbago with sciatica, right side: Secondary | ICD-10-CM | POA: Diagnosis not present

## 2021-04-04 DIAGNOSIS — S32511D Fracture of superior rim of right pubis, subsequent encounter for fracture with routine healing: Secondary | ICD-10-CM | POA: Diagnosis not present

## 2021-04-04 DIAGNOSIS — M5441 Lumbago with sciatica, right side: Secondary | ICD-10-CM | POA: Diagnosis not present

## 2021-04-04 DIAGNOSIS — G8929 Other chronic pain: Secondary | ICD-10-CM | POA: Diagnosis not present

## 2021-04-04 DIAGNOSIS — S2241XD Multiple fractures of ribs, right side, subsequent encounter for fracture with routine healing: Secondary | ICD-10-CM | POA: Diagnosis not present

## 2021-04-07 DIAGNOSIS — S2241XD Multiple fractures of ribs, right side, subsequent encounter for fracture with routine healing: Secondary | ICD-10-CM | POA: Diagnosis not present

## 2021-04-07 DIAGNOSIS — G8929 Other chronic pain: Secondary | ICD-10-CM | POA: Diagnosis not present

## 2021-04-07 DIAGNOSIS — M5441 Lumbago with sciatica, right side: Secondary | ICD-10-CM | POA: Diagnosis not present

## 2021-04-07 DIAGNOSIS — S32511D Fracture of superior rim of right pubis, subsequent encounter for fracture with routine healing: Secondary | ICD-10-CM | POA: Diagnosis not present

## 2021-04-07 DIAGNOSIS — Z8679 Personal history of other diseases of the circulatory system: Secondary | ICD-10-CM | POA: Diagnosis not present

## 2021-04-09 DIAGNOSIS — S2241XD Multiple fractures of ribs, right side, subsequent encounter for fracture with routine healing: Secondary | ICD-10-CM | POA: Diagnosis not present

## 2021-04-14 ENCOUNTER — Encounter: Payer: Self-pay | Admitting: Family Medicine

## 2021-04-14 ENCOUNTER — Telehealth (INDEPENDENT_AMBULATORY_CARE_PROVIDER_SITE_OTHER): Payer: Medicare Other | Admitting: Family Medicine

## 2021-04-14 DIAGNOSIS — S32599A Other specified fracture of unspecified pubis, initial encounter for closed fracture: Secondary | ICD-10-CM | POA: Diagnosis not present

## 2021-04-14 DIAGNOSIS — H25811 Combined forms of age-related cataract, right eye: Secondary | ICD-10-CM | POA: Diagnosis not present

## 2021-04-14 DIAGNOSIS — E785 Hyperlipidemia, unspecified: Secondary | ICD-10-CM | POA: Diagnosis not present

## 2021-04-14 DIAGNOSIS — N390 Urinary tract infection, site not specified: Secondary | ICD-10-CM | POA: Diagnosis not present

## 2021-04-14 DIAGNOSIS — F32A Depression, unspecified: Secondary | ICD-10-CM | POA: Diagnosis not present

## 2021-04-14 DIAGNOSIS — Z9181 History of falling: Secondary | ICD-10-CM | POA: Diagnosis not present

## 2021-04-14 DIAGNOSIS — S2241XD Multiple fractures of ribs, right side, subsequent encounter for fracture with routine healing: Secondary | ICD-10-CM | POA: Diagnosis not present

## 2021-04-14 DIAGNOSIS — K219 Gastro-esophageal reflux disease without esophagitis: Secondary | ICD-10-CM | POA: Diagnosis not present

## 2021-04-14 DIAGNOSIS — G894 Chronic pain syndrome: Secondary | ICD-10-CM | POA: Diagnosis not present

## 2021-04-14 DIAGNOSIS — H25812 Combined forms of age-related cataract, left eye: Secondary | ICD-10-CM | POA: Diagnosis not present

## 2021-04-14 DIAGNOSIS — H52221 Regular astigmatism, right eye: Secondary | ICD-10-CM | POA: Diagnosis not present

## 2021-04-14 DIAGNOSIS — K589 Irritable bowel syndrome without diarrhea: Secondary | ICD-10-CM | POA: Diagnosis not present

## 2021-04-14 DIAGNOSIS — S2241XA Multiple fractures of ribs, right side, initial encounter for closed fracture: Secondary | ICD-10-CM

## 2021-04-14 DIAGNOSIS — Z8679 Personal history of other diseases of the circulatory system: Secondary | ICD-10-CM | POA: Diagnosis not present

## 2021-04-14 DIAGNOSIS — S3282XD Multiple fractures of pelvis without disruption of pelvic ring, subsequent encounter for fracture with routine healing: Secondary | ICD-10-CM | POA: Diagnosis not present

## 2021-04-14 DIAGNOSIS — G4700 Insomnia, unspecified: Secondary | ICD-10-CM | POA: Diagnosis not present

## 2021-04-14 DIAGNOSIS — M5441 Lumbago with sciatica, right side: Secondary | ICD-10-CM | POA: Diagnosis not present

## 2021-04-14 DIAGNOSIS — I10 Essential (primary) hypertension: Secondary | ICD-10-CM | POA: Diagnosis not present

## 2021-04-14 DIAGNOSIS — Z8739 Personal history of other diseases of the musculoskeletal system and connective tissue: Secondary | ICD-10-CM | POA: Diagnosis not present

## 2021-04-14 MED ORDER — OXYCODONE HCL 5 MG PO TABS: 5.0000 mg | ORAL_TABLET | Freq: Two times a day (BID) | ORAL | 0 refills | Status: AC | PRN

## 2021-04-14 NOTE — Progress Notes (Signed)
Virtual Visit via Telephone Note  I connected with  Carolyn Hendrix on 04/14/21 at  2:00 PM EDT by telephone and verified that I am speaking with the correct person using two identifiers.   I discussed the limitations, risks, security and privacy concerns of performing an evaluation and management service by telephone and the availability of in person appointments. I also discussed with the patient that there may be a patient responsible charge related to this service. The patient expressed understanding and agreed to proceed.  Participating parties included in this telephone visit include: The patient and the nurse practitioner listed and patient's daughter, Carolyn Hendrix. The patient is: At home I am: In the office  Subjective:    CC: follow-up broken pelvis/ribs  HPI: Carolyn Hendrix is a 80 y.o. year old female presenting today via telephone visit to discuss fracture follow-up.  03/22/21 - ED: fall; fractures to right posterior 5th and 6th ribs with 5th rib displaced and fractures to right inferior pubic rami and left superior pubic rami; insurance denied home health so she went to [facility] at discharge (x2 weeks). Also treated for UTI during admission.  Patient has been doing fairly well on current regimen of Tramadol and tylenol around the clock and 5 mg oxycodone PRN. She has gotten down from 4 oxy per day to only 1-2 since she has been home from rehab. She ran out yesterday and reports she thinks she needs a few more days worth because if she over does it, the oxy is the only thing that will help. She is schedule to start home health PT next week and is afraid that is going to be very difficult. She is followed by pain management for chronic back pain but cannot get in with them until mid-July. Ortho follow-up scheduled for next week.     Past medical history, Surgical history, Family history not pertinant except as noted below, Social history, Allergies, and medications have been entered  into the medical record, reviewed, and corrections made.   Review of Systems:  All review of systems negative except what is listed in the HPI  Objective:    General:  Patient speaking clearly in complete sentences. No shortness of breath noted.   Alert and oriented x3.   Normal judgment.  No apparent acute distress.  Impression and Recommendations:    1. Closed fracture of multiple ribs of right side, initial encounter 2. Closed fracture of pubic ramus, unspecified laterality, initial encounter [facility])  Patient still needing oxy 1-2x/day for breath-through pain. Ran out yesterday. Will give 5 more days of BID dosing for her to use very sparingly. Educated on safety and side effects/risks of opioids. PDMP reviewed. Recommend she keep the ortho visit scheduled for next week and pain management clinic appointment for July so they can manage this. Patient very agreeable to plan. Patient aware of signs/symptoms requiring further/urgent evaluation. Case discussed with Dr. Lyn Hollingshead as PCP is not available today.   - oxyCODONE (OXY IR/ROXICODONE) 5 MG immediate release tablet; Take 1 tablet (5 mg total) by mouth 2 (two) times daily as needed for up to 5 days for severe pain or breakthrough pain.  Dispense: 10 tablet; Refill: 0   Follow-up as needed.     I discussed the assessment and treatment plan with the patient. The patient was provided an opportunity to ask questions and all were answered. The patient agreed with the plan and demonstrated an understanding of the instructions.   The patient was advised  to call back or seek an in-person evaluation if the symptoms worsen or if the condition fails to improve as anticipated.  I provided 20 minutes of non-face-to-face time during this TELEPHONE encounter.    Clayborne Dana, NP

## 2021-04-15 DIAGNOSIS — K589 Irritable bowel syndrome without diarrhea: Secondary | ICD-10-CM | POA: Diagnosis not present

## 2021-04-15 DIAGNOSIS — M5441 Lumbago with sciatica, right side: Secondary | ICD-10-CM | POA: Diagnosis not present

## 2021-04-15 DIAGNOSIS — G894 Chronic pain syndrome: Secondary | ICD-10-CM | POA: Diagnosis not present

## 2021-04-15 DIAGNOSIS — Z8739 Personal history of other diseases of the musculoskeletal system and connective tissue: Secondary | ICD-10-CM | POA: Diagnosis not present

## 2021-04-15 DIAGNOSIS — H25812 Combined forms of age-related cataract, left eye: Secondary | ICD-10-CM | POA: Diagnosis not present

## 2021-04-15 DIAGNOSIS — H25811 Combined forms of age-related cataract, right eye: Secondary | ICD-10-CM | POA: Diagnosis not present

## 2021-04-15 DIAGNOSIS — Z8679 Personal history of other diseases of the circulatory system: Secondary | ICD-10-CM | POA: Diagnosis not present

## 2021-04-15 DIAGNOSIS — G4700 Insomnia, unspecified: Secondary | ICD-10-CM | POA: Diagnosis not present

## 2021-04-15 DIAGNOSIS — Z9181 History of falling: Secondary | ICD-10-CM | POA: Diagnosis not present

## 2021-04-15 DIAGNOSIS — S3282XD Multiple fractures of pelvis without disruption of pelvic ring, subsequent encounter for fracture with routine healing: Secondary | ICD-10-CM | POA: Diagnosis not present

## 2021-04-15 DIAGNOSIS — F32A Depression, unspecified: Secondary | ICD-10-CM | POA: Diagnosis not present

## 2021-04-15 DIAGNOSIS — S2241XD Multiple fractures of ribs, right side, subsequent encounter for fracture with routine healing: Secondary | ICD-10-CM | POA: Diagnosis not present

## 2021-04-15 DIAGNOSIS — H52221 Regular astigmatism, right eye: Secondary | ICD-10-CM | POA: Diagnosis not present

## 2021-04-15 DIAGNOSIS — I10 Essential (primary) hypertension: Secondary | ICD-10-CM | POA: Diagnosis not present

## 2021-04-15 DIAGNOSIS — K219 Gastro-esophageal reflux disease without esophagitis: Secondary | ICD-10-CM | POA: Diagnosis not present

## 2021-04-15 DIAGNOSIS — N390 Urinary tract infection, site not specified: Secondary | ICD-10-CM | POA: Diagnosis not present

## 2021-04-15 DIAGNOSIS — E785 Hyperlipidemia, unspecified: Secondary | ICD-10-CM | POA: Diagnosis not present

## 2021-04-20 ENCOUNTER — Encounter: Payer: Self-pay | Admitting: Family Medicine

## 2021-04-20 DIAGNOSIS — H25811 Combined forms of age-related cataract, right eye: Secondary | ICD-10-CM | POA: Diagnosis not present

## 2021-04-20 DIAGNOSIS — K589 Irritable bowel syndrome without diarrhea: Secondary | ICD-10-CM | POA: Diagnosis not present

## 2021-04-20 DIAGNOSIS — S3282XD Multiple fractures of pelvis without disruption of pelvic ring, subsequent encounter for fracture with routine healing: Secondary | ICD-10-CM | POA: Diagnosis not present

## 2021-04-20 DIAGNOSIS — E785 Hyperlipidemia, unspecified: Secondary | ICD-10-CM | POA: Diagnosis not present

## 2021-04-20 DIAGNOSIS — N390 Urinary tract infection, site not specified: Secondary | ICD-10-CM | POA: Diagnosis not present

## 2021-04-20 DIAGNOSIS — F32A Depression, unspecified: Secondary | ICD-10-CM | POA: Diagnosis not present

## 2021-04-20 DIAGNOSIS — G894 Chronic pain syndrome: Secondary | ICD-10-CM | POA: Diagnosis not present

## 2021-04-20 DIAGNOSIS — H52221 Regular astigmatism, right eye: Secondary | ICD-10-CM | POA: Diagnosis not present

## 2021-04-20 DIAGNOSIS — S2241XD Multiple fractures of ribs, right side, subsequent encounter for fracture with routine healing: Secondary | ICD-10-CM | POA: Diagnosis not present

## 2021-04-20 DIAGNOSIS — K219 Gastro-esophageal reflux disease without esophagitis: Secondary | ICD-10-CM | POA: Diagnosis not present

## 2021-04-20 DIAGNOSIS — G4700 Insomnia, unspecified: Secondary | ICD-10-CM | POA: Diagnosis not present

## 2021-04-20 DIAGNOSIS — Z8679 Personal history of other diseases of the circulatory system: Secondary | ICD-10-CM | POA: Diagnosis not present

## 2021-04-20 DIAGNOSIS — Z8739 Personal history of other diseases of the musculoskeletal system and connective tissue: Secondary | ICD-10-CM | POA: Diagnosis not present

## 2021-04-20 DIAGNOSIS — H25812 Combined forms of age-related cataract, left eye: Secondary | ICD-10-CM | POA: Diagnosis not present

## 2021-04-20 DIAGNOSIS — M5441 Lumbago with sciatica, right side: Secondary | ICD-10-CM | POA: Diagnosis not present

## 2021-04-20 DIAGNOSIS — I10 Essential (primary) hypertension: Secondary | ICD-10-CM | POA: Diagnosis not present

## 2021-04-20 DIAGNOSIS — Z9181 History of falling: Secondary | ICD-10-CM | POA: Diagnosis not present

## 2021-04-21 NOTE — Telephone Encounter (Signed)
Thank you :)

## 2021-04-22 DIAGNOSIS — K219 Gastro-esophageal reflux disease without esophagitis: Secondary | ICD-10-CM | POA: Diagnosis not present

## 2021-04-22 DIAGNOSIS — E785 Hyperlipidemia, unspecified: Secondary | ICD-10-CM | POA: Diagnosis not present

## 2021-04-22 DIAGNOSIS — Z9181 History of falling: Secondary | ICD-10-CM | POA: Diagnosis not present

## 2021-04-22 DIAGNOSIS — M5441 Lumbago with sciatica, right side: Secondary | ICD-10-CM | POA: Diagnosis not present

## 2021-04-22 DIAGNOSIS — F32A Depression, unspecified: Secondary | ICD-10-CM | POA: Diagnosis not present

## 2021-04-22 DIAGNOSIS — N390 Urinary tract infection, site not specified: Secondary | ICD-10-CM | POA: Diagnosis not present

## 2021-04-22 DIAGNOSIS — S2241XD Multiple fractures of ribs, right side, subsequent encounter for fracture with routine healing: Secondary | ICD-10-CM | POA: Diagnosis not present

## 2021-04-22 DIAGNOSIS — G4700 Insomnia, unspecified: Secondary | ICD-10-CM | POA: Diagnosis not present

## 2021-04-22 DIAGNOSIS — H25812 Combined forms of age-related cataract, left eye: Secondary | ICD-10-CM | POA: Diagnosis not present

## 2021-04-22 DIAGNOSIS — H52221 Regular astigmatism, right eye: Secondary | ICD-10-CM | POA: Diagnosis not present

## 2021-04-22 DIAGNOSIS — Z8679 Personal history of other diseases of the circulatory system: Secondary | ICD-10-CM | POA: Diagnosis not present

## 2021-04-22 DIAGNOSIS — G894 Chronic pain syndrome: Secondary | ICD-10-CM | POA: Diagnosis not present

## 2021-04-22 DIAGNOSIS — K589 Irritable bowel syndrome without diarrhea: Secondary | ICD-10-CM | POA: Diagnosis not present

## 2021-04-22 DIAGNOSIS — H25811 Combined forms of age-related cataract, right eye: Secondary | ICD-10-CM | POA: Diagnosis not present

## 2021-04-22 DIAGNOSIS — Z8739 Personal history of other diseases of the musculoskeletal system and connective tissue: Secondary | ICD-10-CM | POA: Diagnosis not present

## 2021-04-22 DIAGNOSIS — I10 Essential (primary) hypertension: Secondary | ICD-10-CM | POA: Diagnosis not present

## 2021-04-22 DIAGNOSIS — S3282XD Multiple fractures of pelvis without disruption of pelvic ring, subsequent encounter for fracture with routine healing: Secondary | ICD-10-CM | POA: Diagnosis not present

## 2021-04-26 ENCOUNTER — Other Ambulatory Visit: Payer: Self-pay | Admitting: Adult Health

## 2021-04-26 DIAGNOSIS — F411 Generalized anxiety disorder: Secondary | ICD-10-CM

## 2021-04-27 DIAGNOSIS — M5136 Other intervertebral disc degeneration, lumbar region: Secondary | ICD-10-CM | POA: Diagnosis not present

## 2021-04-27 DIAGNOSIS — Z79891 Long term (current) use of opiate analgesic: Secondary | ICD-10-CM | POA: Diagnosis not present

## 2021-04-27 DIAGNOSIS — M961 Postlaminectomy syndrome, not elsewhere classified: Secondary | ICD-10-CM | POA: Diagnosis not present

## 2021-04-27 DIAGNOSIS — M47816 Spondylosis without myelopathy or radiculopathy, lumbar region: Secondary | ICD-10-CM | POA: Diagnosis not present

## 2021-04-27 DIAGNOSIS — G894 Chronic pain syndrome: Secondary | ICD-10-CM | POA: Diagnosis not present

## 2021-04-27 NOTE — Telephone Encounter (Signed)
Pt called and asked when her refill of the xanax will be filled. Going out of town tomorrow

## 2021-04-28 NOTE — Telephone Encounter (Signed)
Last filled 6/1

## 2021-05-11 DIAGNOSIS — M5441 Lumbago with sciatica, right side: Secondary | ICD-10-CM | POA: Diagnosis not present

## 2021-05-11 DIAGNOSIS — Z8679 Personal history of other diseases of the circulatory system: Secondary | ICD-10-CM | POA: Diagnosis not present

## 2021-05-11 DIAGNOSIS — Z8739 Personal history of other diseases of the musculoskeletal system and connective tissue: Secondary | ICD-10-CM | POA: Diagnosis not present

## 2021-05-11 DIAGNOSIS — H52221 Regular astigmatism, right eye: Secondary | ICD-10-CM | POA: Diagnosis not present

## 2021-05-11 DIAGNOSIS — G894 Chronic pain syndrome: Secondary | ICD-10-CM | POA: Diagnosis not present

## 2021-05-11 DIAGNOSIS — H25811 Combined forms of age-related cataract, right eye: Secondary | ICD-10-CM | POA: Diagnosis not present

## 2021-05-11 DIAGNOSIS — G4700 Insomnia, unspecified: Secondary | ICD-10-CM | POA: Diagnosis not present

## 2021-05-11 DIAGNOSIS — F32A Depression, unspecified: Secondary | ICD-10-CM | POA: Diagnosis not present

## 2021-05-11 DIAGNOSIS — S2241XD Multiple fractures of ribs, right side, subsequent encounter for fracture with routine healing: Secondary | ICD-10-CM | POA: Diagnosis not present

## 2021-05-11 DIAGNOSIS — K219 Gastro-esophageal reflux disease without esophagitis: Secondary | ICD-10-CM | POA: Diagnosis not present

## 2021-05-11 DIAGNOSIS — K589 Irritable bowel syndrome without diarrhea: Secondary | ICD-10-CM | POA: Diagnosis not present

## 2021-05-11 DIAGNOSIS — E785 Hyperlipidemia, unspecified: Secondary | ICD-10-CM | POA: Diagnosis not present

## 2021-05-11 DIAGNOSIS — H25812 Combined forms of age-related cataract, left eye: Secondary | ICD-10-CM | POA: Diagnosis not present

## 2021-05-11 DIAGNOSIS — N390 Urinary tract infection, site not specified: Secondary | ICD-10-CM | POA: Diagnosis not present

## 2021-05-11 DIAGNOSIS — I10 Essential (primary) hypertension: Secondary | ICD-10-CM | POA: Diagnosis not present

## 2021-05-11 DIAGNOSIS — S3282XD Multiple fractures of pelvis without disruption of pelvic ring, subsequent encounter for fracture with routine healing: Secondary | ICD-10-CM | POA: Diagnosis not present

## 2021-05-11 DIAGNOSIS — Z9181 History of falling: Secondary | ICD-10-CM | POA: Diagnosis not present

## 2021-05-12 ENCOUNTER — Ambulatory Visit (INDEPENDENT_AMBULATORY_CARE_PROVIDER_SITE_OTHER): Payer: Medicare Other | Admitting: Sports Medicine

## 2021-05-12 ENCOUNTER — Other Ambulatory Visit: Payer: Self-pay

## 2021-05-12 ENCOUNTER — Other Ambulatory Visit: Payer: Self-pay | Admitting: Sports Medicine

## 2021-05-12 ENCOUNTER — Ambulatory Visit (INDEPENDENT_AMBULATORY_CARE_PROVIDER_SITE_OTHER): Payer: Medicare Other

## 2021-05-12 DIAGNOSIS — S32599A Other specified fracture of unspecified pubis, initial encounter for closed fracture: Secondary | ICD-10-CM

## 2021-05-12 DIAGNOSIS — S32511D Fracture of superior rim of right pubis, subsequent encounter for fracture with routine healing: Secondary | ICD-10-CM | POA: Diagnosis not present

## 2021-05-12 DIAGNOSIS — S2241XA Multiple fractures of ribs, right side, initial encounter for closed fracture: Secondary | ICD-10-CM

## 2021-05-12 DIAGNOSIS — S329XXA Fracture of unspecified parts of lumbosacral spine and pelvis, initial encounter for closed fracture: Secondary | ICD-10-CM | POA: Insufficient documentation

## 2021-05-12 DIAGNOSIS — S32512A Fracture of superior rim of left pubis, initial encounter for closed fracture: Secondary | ICD-10-CM | POA: Diagnosis not present

## 2021-05-12 DIAGNOSIS — M1612 Unilateral primary osteoarthritis, left hip: Secondary | ICD-10-CM | POA: Diagnosis not present

## 2021-05-12 DIAGNOSIS — S2242XA Multiple fractures of ribs, left side, initial encounter for closed fracture: Secondary | ICD-10-CM

## 2021-05-12 DIAGNOSIS — I878 Other specified disorders of veins: Secondary | ICD-10-CM | POA: Diagnosis not present

## 2021-05-12 DIAGNOSIS — S2232XA Fracture of one rib, left side, initial encounter for closed fracture: Secondary | ICD-10-CM | POA: Insufficient documentation

## 2021-05-12 DIAGNOSIS — M8580 Other specified disorders of bone density and structure, unspecified site: Secondary | ICD-10-CM

## 2021-05-12 NOTE — Assessment & Plan Note (Addendum)
As above Carolie was taking out the trash 6 weeks ago, fell, she ultimately had a left superior and right inferior pubic ramus fracture, nondisplaced, she was weightbearing as tolerated in the ED and throughout her rehab center stay, she is getting home health physical therapy now and continues to improve. She still has some pain at her pubic ramus to palpation. I will get some updated x-rays. She will continue pain management with Dr. Oneal Grout. I would like to see her back in about 4 weeks, continue therapy. She is due for a bone density test as well.  I reviewed her rib and pelvic x-rays, rib fractures appear to be healing with bony callus formation, pelvic x-ray shows a more extensive fracture than previously described from emergency department reports, she has bilateral superior and inferior pubic rami fractures with some displacement, she does have some bony callus formation.  Considering her clinical improvement we will hold off on additional intervention and imaging, but if she does plateau we will likely get a CT scan to evaluate for malunion.

## 2021-05-12 NOTE — Progress Notes (Addendum)
    Procedures performed today:    None.  Independent interpretation of notes and tests performed by another provider:   I reviewed records from the emergency department report including progress notes, imaging.  I reviewed her rib and pelvic x-rays, rib fractures appear to be healing with bony callus formation, pelvic x-ray shows a more extensive fracture than previously described from emergency department reports, she has bilateral superior and inferior pubic rami fractures with some displacement, she does have some bony callus formation.  Brief History, Exam, Impression, and Recommendations:    Left rib fracture Torrin had a fall about 6 weeks ago while taking out the trash, she was ultimately seen in the ED, noted to rib fractures on the left. She has done well over the past 6 weeks, she was in a rehab facility for a period of time, she also has a opiate contract with her pain doctor Dr. Oneal Grout. Since she is doing so well we will simply watch this for now, she can continue her therapy, I would like some updated x-rays today.  Pelvic fracture (HCC) As above Sabrie was taking out the trash 6 weeks ago, fell, she ultimately had a left superior and right inferior pubic ramus fracture, nondisplaced, she was weightbearing as tolerated in the ED and throughout her rehab center stay, she is getting home health physical therapy now and continues to improve. She still has some pain at her pubic ramus to palpation. I will get some updated x-rays. She will continue pain management with Dr. Oneal Grout. I would like to see her back in about 4 weeks, continue therapy. She is due for a bone density test as well.  I reviewed her rib and pelvic x-rays, rib fractures appear to be healing with bony callus formation, pelvic x-ray shows a more extensive fracture than previously described from emergency department reports, she has bilateral superior and inferior pubic rami fractures with some displacement, she  does have some bony callus formation.  Considering her clinical improvement we will hold off on additional intervention and imaging, but if she does plateau we will likely get a CT scan to evaluate for malunion.  Osteopenia Due for another bone density test, ordering this now.    ___________________________________________ Ihor Austin. Benjamin Stain, M.D., ABFM., CAQSM. Primary Care and Sports Medicine [facility]  Adjunct Instructor of Family Medicine  [employer]

## 2021-05-12 NOTE — Assessment & Plan Note (Signed)
Due for another bone density test, ordering this now.

## 2021-05-12 NOTE — Assessment & Plan Note (Signed)
Carolyn Hendrix had a fall about 6 weeks ago while taking out the trash, she was ultimately seen in the ED, noted to rib fractures on the left. She has done well over the past 6 weeks, she was in a rehab facility for a period of time, she also has a opiate contract with her pain doctor Dr. Oneal Grout. Since she is doing so well we will simply watch this for now, she can continue her therapy, I would like some updated x-rays today.

## 2021-05-16 DIAGNOSIS — F32A Depression, unspecified: Secondary | ICD-10-CM | POA: Diagnosis not present

## 2021-05-16 DIAGNOSIS — M5441 Lumbago with sciatica, right side: Secondary | ICD-10-CM | POA: Diagnosis not present

## 2021-05-16 DIAGNOSIS — M47816 Spondylosis without myelopathy or radiculopathy, lumbar region: Secondary | ICD-10-CM | POA: Diagnosis not present

## 2021-05-16 DIAGNOSIS — I10 Essential (primary) hypertension: Secondary | ICD-10-CM | POA: Diagnosis not present

## 2021-05-16 DIAGNOSIS — Z8739 Personal history of other diseases of the musculoskeletal system and connective tissue: Secondary | ICD-10-CM | POA: Diagnosis not present

## 2021-05-16 DIAGNOSIS — K589 Irritable bowel syndrome without diarrhea: Secondary | ICD-10-CM | POA: Diagnosis not present

## 2021-05-16 DIAGNOSIS — G4700 Insomnia, unspecified: Secondary | ICD-10-CM | POA: Diagnosis not present

## 2021-05-16 DIAGNOSIS — S2241XD Multiple fractures of ribs, right side, subsequent encounter for fracture with routine healing: Secondary | ICD-10-CM | POA: Diagnosis not present

## 2021-05-16 DIAGNOSIS — H25811 Combined forms of age-related cataract, right eye: Secondary | ICD-10-CM | POA: Diagnosis not present

## 2021-05-16 DIAGNOSIS — N390 Urinary tract infection, site not specified: Secondary | ICD-10-CM | POA: Diagnosis not present

## 2021-05-16 DIAGNOSIS — S3282XD Multiple fractures of pelvis without disruption of pelvic ring, subsequent encounter for fracture with routine healing: Secondary | ICD-10-CM | POA: Diagnosis not present

## 2021-05-16 DIAGNOSIS — K219 Gastro-esophageal reflux disease without esophagitis: Secondary | ICD-10-CM | POA: Diagnosis not present

## 2021-05-16 DIAGNOSIS — Z9181 History of falling: Secondary | ICD-10-CM | POA: Diagnosis not present

## 2021-05-16 DIAGNOSIS — E785 Hyperlipidemia, unspecified: Secondary | ICD-10-CM | POA: Diagnosis not present

## 2021-05-16 DIAGNOSIS — M961 Postlaminectomy syndrome, not elsewhere classified: Secondary | ICD-10-CM | POA: Diagnosis not present

## 2021-05-16 DIAGNOSIS — H52221 Regular astigmatism, right eye: Secondary | ICD-10-CM | POA: Diagnosis not present

## 2021-05-16 DIAGNOSIS — Z8679 Personal history of other diseases of the circulatory system: Secondary | ICD-10-CM | POA: Diagnosis not present

## 2021-05-16 DIAGNOSIS — H25812 Combined forms of age-related cataract, left eye: Secondary | ICD-10-CM | POA: Diagnosis not present

## 2021-05-16 DIAGNOSIS — M5136 Other intervertebral disc degeneration, lumbar region: Secondary | ICD-10-CM | POA: Diagnosis not present

## 2021-05-16 DIAGNOSIS — Z79891 Long term (current) use of opiate analgesic: Secondary | ICD-10-CM | POA: Diagnosis not present

## 2021-05-16 DIAGNOSIS — G894 Chronic pain syndrome: Secondary | ICD-10-CM | POA: Diagnosis not present

## 2021-05-25 ENCOUNTER — Ambulatory Visit (INDEPENDENT_AMBULATORY_CARE_PROVIDER_SITE_OTHER): Payer: Medicare Other

## 2021-05-25 ENCOUNTER — Other Ambulatory Visit: Payer: Self-pay

## 2021-05-25 DIAGNOSIS — Z780 Asymptomatic menopausal state: Secondary | ICD-10-CM | POA: Diagnosis not present

## 2021-05-25 DIAGNOSIS — M8588 Other specified disorders of bone density and structure, other site: Secondary | ICD-10-CM

## 2021-05-25 DIAGNOSIS — M8580 Other specified disorders of bone density and structure, unspecified site: Secondary | ICD-10-CM | POA: Diagnosis not present

## 2021-05-26 ENCOUNTER — Ambulatory Visit (INDEPENDENT_AMBULATORY_CARE_PROVIDER_SITE_OTHER): Payer: Medicare Other | Admitting: Family Medicine

## 2021-05-26 VITALS — BP 129/66 | HR 97 | Temp 98.9°F | Ht 66.0 in | Wt 160.0 lb

## 2021-05-26 DIAGNOSIS — M8580 Other specified disorders of bone density and structure, unspecified site: Secondary | ICD-10-CM | POA: Diagnosis not present

## 2021-05-26 DIAGNOSIS — F32A Depression, unspecified: Secondary | ICD-10-CM

## 2021-05-26 DIAGNOSIS — F329 Major depressive disorder, single episode, unspecified: Secondary | ICD-10-CM | POA: Diagnosis not present

## 2021-05-26 DIAGNOSIS — F411 Generalized anxiety disorder: Secondary | ICD-10-CM

## 2021-05-26 DIAGNOSIS — F5101 Primary insomnia: Secondary | ICD-10-CM

## 2021-05-26 MED ORDER — BUPROPION HCL ER (XL) 150 MG PO TB24: 150.0000 mg | ORAL_TABLET | Freq: Every day | ORAL | 0 refills | Status: DC

## 2021-05-26 MED ORDER — TRAZODONE HCL 100 MG PO TABS: ORAL_TABLET | ORAL | 0 refills | Status: DC

## 2021-05-26 NOTE — Progress Notes (Signed)
Established Patient Office Visit  Subjective:  Patient ID: Carolyn Hendrix, female    DOB: January 29, 1941  Age: 80 y.o. MRN: [id number]  CC:  Chief Complaint  Patient presents with   Depression    HPI Carolyn Hendrix presents for   F/U anxiety/Depression  -overall she is doing okay.  She actually had a pretty devastating fall that resulted in multiple pelvic fractures and rib fractures.  She evidently had a UTI and did not know it and got disoriented and fell try to take the garbage out.  She was getting 2 weeks of inpatient rehab after the fractures and did well and is now getting physical therapy at home through [facility].  They are coming out a couple times a week.  She is currently on oxycodone 10/325 4-5 tabs per day.  Still taking her sertraline 100 mg daily regularly.  She is also been using her Xanax sometimes up to twice a day but sometimes not at all she was only started on that within the last year.  F/U insomnia -still taking trazodone and it does help.  Multiple fractures-she just had a bone density test performed on August 3 showing a T score -1.4/osteopenia.  Risk for major osteoparotic fracture within the next 10 years is 29.6% and 15.1% for the hip.  This would put her into the risk category for treatment with a bisphosphonate.  Spent time reviewing records from her ED visits and fracture follow-ups.  Past Medical History:  Diagnosis Date   Chronic LBP    Lumbar DDD   Colitis, ischemic (HCC)    Depression    Endometriosis    GERD (gastroesophageal reflux disease)    Hematuria    Hyperlipidemia    IBS (irritable bowel syndrome)    Fr Hurrelbrink- on Miralax and Citrucel daily   Migraines    MVP (mitral valve prolapse)     Past Surgical History:  Procedure Laterality Date   ABDOMINAL HYSTERECTOMY  1991   w/ bilat oophorectomy for endometriosis   APPENDECTOMY     BREAST BIOPSY     ESOPHAGOGASTRODUODENOSCOPY  06   fused 5th digit right hand Right     HEMILAMINOTOMY LUMBAR SPINE  11/18/1991   Dr. Lorain Childes   lapartomy  1995   OTHER SURGICAL HISTORY  1992, 1994   right breast biopsy- benign     SPINAL FUSION  08/18/97   Dr. Dorita Fray at [facility], L4-5 post fusion with iliac crest bone graft   TONSILLECTOMY AND ADENOIDECTOMY      Family History  Problem Relation Age of Onset   Alzheimer's disease Mother    Other Father        ACS? - 80's   Ataxia Sister     Social History   Socioeconomic History   Marital status: Widowed    Spouse name: [name]   Number of children: 2   Years of education: 12   Highest education level: 12th grade  Occupational History   Occupation: retired    Comment: Print production planner for city of [employer]  Tobacco Use   Smoking status: Former    Types: Cigarettes    Quit date: 10/23/1980    Years since quitting: 40.6   Smokeless tobacco: Never  Vaping Use   Vaping Use: Never used  Substance and Sexual Activity   Alcohol use: No   Drug use: No   Sexual activity: Not Currently  Other Topics Concern   Not on file  Social History  Narrative   Her husband passed away in 2020-10-28. She is getting better at taking care of herself as she had to take care of her husband for the past 2.5 years. She lives alone with her dog.    Social Determinants of Health   Financial Resource Strain: Low Risk    Difficulty of Paying Living Expenses: Not hard at all  Food Insecurity: No Food Insecurity   Worried About Programme researcher, broadcasting/film/video in the Last Year: Never true   Ran Out of Food in the Last Year: Never true  Transportation Needs: No Transportation Needs   Lack of Transportation (Medical): No   Lack of Transportation (Non-Medical): No  Physical Activity: Inactive   Days of Exercise per Week: 0 days   Minutes of Exercise per Session: 0 min  Stress: No Stress Concern Present   Feeling of Stress : Not at all  Social Connections: Moderately Isolated   Frequency of Communication with Friends and Family: More than  three times a week   Frequency of Social Gatherings with Friends and Family: Never   Attends Religious Services: 1 to 4 times per year   Active Member of [employer] or Organizations: No   Attends Banker Meetings: Never   Marital Status: Widowed  Catering manager Violence: Not At Risk   Fear of Current or Ex-Partner: No   Emotionally Abused: No   Physically Abused: No   Sexually Abused: No    Outpatient Medications Prior to Visit  Medication Sig Dispense Refill   acyclovir ointment (ZOVIRAX) 5 % Apply 1 application topically every 3 (three) hours. 5 g 2   ALPRAZolam (XANAX) 0.25 MG tablet TAKE 1 TABLET BY MOUTH TWICE A DAY AS NEEDED FOR ANXIETY 60 tablet 0   Cholecalciferol (VITAMIN D3) 25 MCG (1000 UT) CAPS 1 capsule     dicyclomine (BENTYL) 10 MG capsule TAKE 1 CAPSULE BY MOUTH THREE TIMES A DAY AS NEEDED FOR SPASMS 270 capsule 0   glucosamine-chondroitin 500-400 MG tablet Take 1 tablet by mouth 2 (two) times daily.     Kava, Piper methysticum, (KAVA KAVA) 200 MG CAPS See admin instructions.     Magnesium 300 MG CAPS 1 capsule with a meal     methocarbamol (ROBAXIN) 750 MG tablet Take 1 tablet by mouth every 12 (twelve) hours.     Multiple Vitamins-Minerals (SENTRY) TABS See admin instructions.     omeprazole (PRILOSEC) 20 MG capsule TAKE 1 CAPSULE BY MOUTH EVERY DAY 90 capsule 3   oxyCODONE-acetaminophen (PERCOCET) 10-325 MG tablet Take 1 tablet by mouth every 6 (six) hours as needed for pain.     Probiotic Product (PROBIOTIC-10 PO) See admin instructions.     pseudoephedrine (SUDAFED) 60 MG tablet 1 tablet as needed     sertraline (ZOLOFT) 100 MG tablet TAKE 1 TABLET BY MOUTH EVERY DAY 90 tablet 2   traMADol (ULTRAM-ER) 200 MG 24 hr tablet Take 200 mg by mouth daily.     TURMERIC PO Take by mouth daily.     buPROPion (WELLBUTRIN XL) 300 MG 24 hr tablet TAKE 1 TABLET BY MOUTH EVERY DAY 90 tablet 1   traZODone (DESYREL) 100 MG tablet TAKE 1 TABLET BY MOUTH EVERYDAY AT  BEDTIME 90 tablet 0   pravastatin (PRAVACHOL) 40 MG tablet TAKE 1 TABLET BY MOUTH EVERY DAY (Patient not taking: Reported on 05/26/2021) 90 tablet 3   traMADol (ULTRAM) 50 MG tablet TAKE 1 TO 2 TABLETS BY MOUTH 3 TIMES A DAY  AS NEEDED FOR PAIN  1   No facility-administered medications prior to visit.    Allergies  Allergen Reactions   Cymbalta [Duloxetine Hcl] Diarrhea   Morphine Hives    ROS Review of Systems    Objective:    Physical Exam Constitutional:      Appearance: Normal appearance. She is well-developed.  HENT:     Head: Normocephalic and atraumatic.  Cardiovascular:     Rate and Rhythm: Normal rate and regular rhythm.     Heart sounds: Normal heart sounds.  Pulmonary:     Effort: Pulmonary effort is normal.     Breath sounds: Normal breath sounds.  Skin:    General: Skin is warm and dry.  Neurological:     Mental Status: She is alert and oriented to person, place, and time.  Psychiatric:        Behavior: Behavior normal.    BP 129/66   Pulse 97   Temp 98.9 F (37.2 C) (Oral)   Ht 5\' 6"  (1.676 m)   Wt 160 lb (72.6 kg)   SpO2 97% Comment: on RA  BMI 25.82 kg/m  Wt Readings from Last 3 Encounters:  05/26/21 160 lb (72.6 kg)  02/22/21 160 lb (72.6 kg)  12/23/20 153 lb (69.4 kg)     Health Maintenance Due  Topic Date Due   COVID-19 Vaccine (4 - Booster for Pfizer series) 12/22/2020   INFLUENZA VACCINE  05/23/2021    There are no preventive care reminders to display for this patient.  Lab Results  Component Value Date   TSH 1.81 11/16/2017   Lab Results  Component Value Date   WBC 8.7 12/24/2020   HGB 13.5 12/24/2020   HCT 40.7 12/24/2020   MCV 93.1 12/24/2020   PLT 324 12/24/2020   Lab Results  Component Value Date   NA 143 12/24/2020   K 4.5 12/24/2020   CO2 28 12/24/2020   GLUCOSE 93 12/24/2020   BUN 27 (H) 12/24/2020   CREATININE 1.11 (H) 12/24/2020   BILITOT 0.3 12/24/2020   ALKPHOS 80 11/10/2016   AST 16 12/24/2020   ALT 17  12/24/2020   PROT 6.4 12/24/2020   ALBUMIN 4.3 11/10/2016   CALCIUM 9.2 12/24/2020   Lab Results  Component Value Date   CHOL 179 12/24/2020   Lab Results  Component Value Date   HDL 47 (L) 12/24/2020   Lab Results  Component Value Date   LDLCALC 109 (H) 12/24/2020   Lab Results  Component Value Date   TRIG 122 12/24/2020   Lab Results  Component Value Date   CHOLHDL 3.8 12/24/2020   No results found for: HGBA1C    Assessment & Plan:   Problem List Items Addressed This Visit       Musculoskeletal and Integument   Osteopenia    bone density test performed on August 3 showing a T score -1.4/osteopenia.  Risk for major osteoparotic fracture within the next 10 years is 29.6% and 15.1% for the hip.  This would put her into the risk category for treatment with a bisphosphonate.  Will start fosamax.        Relevant Medications   alendronate (FOSAMAX) 70 MG tablet     Other   Primary insomnia    Kay to continue with trazodone for now.       Relevant Medications   traZODone (DESYREL) 100 MG tablet   Depressive disorder - Primary    Continue with sertraline 100 mg daily.  As well as Wellbutrin though working to go ahead and decrease her Wellbutrin when she is due for her refill next month she is going to currently finish out her 300 mg dose.  By then she will be closer to full recovery from her fractures and a lot more independent.  We can continue with the 150 mg oral to be Futran for at least a few months and if at that point she is doing well we can work on tapering it off completely.       Relevant Medications   buPROPion (WELLBUTRIN XL) 150 MG 24 hr tablet   traZODone (DESYREL) 100 MG tablet   Anxiety state    She has been given Xanax to use as needed we discussed that it can actually increase her risk for falls even though this particular fall sounds like it was caused by a UTI that she was not aware of that caused her to be obtunded.  But again we just  discussed that it does increase that risk for falls as well as dementia and encouraged her to start tapering off of the medication so that we can get her completely off.       Relevant Medications   buPROPion (WELLBUTRIN XL) 150 MG 24 hr tablet   traZODone (DESYREL) 100 MG tablet    Meds ordered this encounter  Medications   buPROPion (WELLBUTRIN XL) 150 MG 24 hr tablet    Sig: Take 1 tablet (150 mg total) by mouth daily.    Dispense:  90 tablet    Refill:  0    PT will call when needed.   traZODone (DESYREL) 100 MG tablet    Sig: TAKE 1 TABLET BY MOUTH EVERYDAY AT BEDTIME    Dispense:  90 tablet    Refill:  0   alendronate (FOSAMAX) 70 MG tablet    Sig: Take 1 tablet (70 mg total) by mouth every 7 (seven) days. Take with a full glass of water on an empty stomach.    Dispense:  12 tablet    Refill:  4    Follow-up: Return in about 2 months (around 07/26/2021) for Mood medication .    Nani Gasseratherine Charon Smedberg, MD

## 2021-05-27 ENCOUNTER — Encounter: Payer: Self-pay | Admitting: Family Medicine

## 2021-05-27 MED ORDER — ALENDRONATE SODIUM 70 MG PO TABS: 70.0000 mg | ORAL_TABLET | ORAL | 4 refills | Status: DC

## 2021-05-27 NOTE — Assessment & Plan Note (Addendum)
bone density test performed on August 3 showing a T score -1.4/osteopenia.  Risk for major osteoparotic fracture within the next 10 years is 29.6% and 15.1% for the hip.  This would put her into the risk category for treatment with a bisphosphonate.  Will start fosamax.

## 2021-05-27 NOTE — Assessment & Plan Note (Signed)
She has been given Xanax to use as needed we discussed that it can actually increase her risk for falls even though this particular fall sounds like it was caused by a UTI that she was not aware of that caused her to be obtunded.  But again we just discussed that it does increase that risk for falls as well as dementia and encouraged her to start tapering off of the medication so that we can get her completely off.

## 2021-05-27 NOTE — Assessment & Plan Note (Signed)
Continue with sertraline 100 mg daily.  As well as Wellbutrin though working to go ahead and decrease her Wellbutrin when she is due for her refill next month she is going to currently finish out her 300 mg dose.  By then she will be closer to full recovery from her fractures and a lot more independent.  We can continue with the 150 mg oral to be Futran for at least a few months and if at that point she is doing well we can work on tapering it off completely.

## 2021-05-27 NOTE — Assessment & Plan Note (Signed)
Joyce Gross to continue with trazodone for now.

## 2021-06-02 DIAGNOSIS — M5441 Lumbago with sciatica, right side: Secondary | ICD-10-CM | POA: Diagnosis not present

## 2021-06-02 DIAGNOSIS — H25811 Combined forms of age-related cataract, right eye: Secondary | ICD-10-CM | POA: Diagnosis not present

## 2021-06-02 DIAGNOSIS — H25812 Combined forms of age-related cataract, left eye: Secondary | ICD-10-CM | POA: Diagnosis not present

## 2021-06-02 DIAGNOSIS — K219 Gastro-esophageal reflux disease without esophagitis: Secondary | ICD-10-CM | POA: Diagnosis not present

## 2021-06-02 DIAGNOSIS — S3282XD Multiple fractures of pelvis without disruption of pelvic ring, subsequent encounter for fracture with routine healing: Secondary | ICD-10-CM | POA: Diagnosis not present

## 2021-06-02 DIAGNOSIS — G894 Chronic pain syndrome: Secondary | ICD-10-CM | POA: Diagnosis not present

## 2021-06-02 DIAGNOSIS — Z9181 History of falling: Secondary | ICD-10-CM | POA: Diagnosis not present

## 2021-06-02 DIAGNOSIS — F32A Depression, unspecified: Secondary | ICD-10-CM | POA: Diagnosis not present

## 2021-06-02 DIAGNOSIS — H52221 Regular astigmatism, right eye: Secondary | ICD-10-CM | POA: Diagnosis not present

## 2021-06-02 DIAGNOSIS — N390 Urinary tract infection, site not specified: Secondary | ICD-10-CM | POA: Diagnosis not present

## 2021-06-02 DIAGNOSIS — K589 Irritable bowel syndrome without diarrhea: Secondary | ICD-10-CM | POA: Diagnosis not present

## 2021-06-02 DIAGNOSIS — Z8739 Personal history of other diseases of the musculoskeletal system and connective tissue: Secondary | ICD-10-CM | POA: Diagnosis not present

## 2021-06-02 DIAGNOSIS — E785 Hyperlipidemia, unspecified: Secondary | ICD-10-CM | POA: Diagnosis not present

## 2021-06-02 DIAGNOSIS — I10 Essential (primary) hypertension: Secondary | ICD-10-CM | POA: Diagnosis not present

## 2021-06-02 DIAGNOSIS — S2241XD Multiple fractures of ribs, right side, subsequent encounter for fracture with routine healing: Secondary | ICD-10-CM | POA: Diagnosis not present

## 2021-06-02 DIAGNOSIS — Z8679 Personal history of other diseases of the circulatory system: Secondary | ICD-10-CM | POA: Diagnosis not present

## 2021-06-02 DIAGNOSIS — G4700 Insomnia, unspecified: Secondary | ICD-10-CM | POA: Diagnosis not present

## 2021-06-03 ENCOUNTER — Other Ambulatory Visit: Payer: Self-pay

## 2021-06-03 ENCOUNTER — Ambulatory Visit (INDEPENDENT_AMBULATORY_CARE_PROVIDER_SITE_OTHER): Payer: Medicare Other

## 2021-06-03 ENCOUNTER — Ambulatory Visit (INDEPENDENT_AMBULATORY_CARE_PROVIDER_SITE_OTHER): Payer: Medicare Other | Admitting: Sports Medicine

## 2021-06-03 DIAGNOSIS — M7582 Other shoulder lesions, left shoulder: Secondary | ICD-10-CM

## 2021-06-03 DIAGNOSIS — M19012 Primary osteoarthritis, left shoulder: Secondary | ICD-10-CM | POA: Diagnosis not present

## 2021-06-03 DIAGNOSIS — S32599D Other specified fracture of unspecified pubis, subsequent encounter for fracture with routine healing: Secondary | ICD-10-CM | POA: Diagnosis not present

## 2021-06-03 DIAGNOSIS — S81812A Laceration without foreign body, left lower leg, initial encounter: Secondary | ICD-10-CM

## 2021-06-03 NOTE — Assessment & Plan Note (Signed)
Carolyn Hendrix returns, she has a long history of rotator cuff tendinitis, pain, she has been using her walker a lot more since her pelvic fractures, and is having increasing pain in the left shoulder, profoundly weak to abduction and internal rotation suggestive of rotator cuff tearing. We will do a glenohumeral injection today, get some updated x-rays. Rotator cuff conditioning given, return to see me in 4 to 6 weeks for this.

## 2021-06-03 NOTE — Assessment & Plan Note (Signed)
Carolyn Hendrix returns, she is now about 9 weeks post bilateral pelvic fractures, superior and inferior bilateral pubic rami. Doing well with home health physical therapy.

## 2021-06-03 NOTE — Assessment & Plan Note (Signed)
Small skin tear, inflamed but not infected, adding Dermabond. Return as needed.

## 2021-06-03 NOTE — Progress Notes (Signed)
    Procedures performed today:    Procedure: Real-time Ultrasound Guided injection of the left glenohumeral joint Device: Samsung HS60  Verbal informed consent obtained.  Time-out conducted.  Noted no overlying erythema, induration, or other signs of local infection.  Skin prepped in a sterile fashion.  Local anesthesia: Topical Ethyl chloride.  With sterile technique and under real time ultrasound guidance: Noted arthritic joint, 1 cc Kenalog 40, 2 cc lidocaine, 2 cc bupivacaine injected easily Completed without difficulty  Advised to call if fevers/chills, erythema, induration, drainage, or persistent bleeding.  Images permanently stored and available for review in PACS.  Impression: Technically successful ultrasound guided injection.  Independent interpretation of notes and tests performed by another provider:   None.  Brief History, Exam, Impression, and Recommendations:    Rotator cuff tendonitis, left Carolyn Hendrix returns, she has a long history of rotator cuff tendinitis, pain, she has been using her walker a lot more since her pelvic fractures, and is having increasing pain in the left shoulder, profoundly weak to abduction and internal rotation suggestive of rotator cuff tearing. We will do a glenohumeral injection today, get some updated x-rays. Rotator cuff conditioning given, return to see me in 4 to 6 weeks for this.  Pelvic fracture (HCC) Carolyn Hendrix returns, she is now about 9 weeks post bilateral pelvic fractures, superior and inferior bilateral pubic rami. Doing well with home health physical therapy.   Noninfected skin tear of leg, left, initial encounter Small skin tear, inflamed but not infected, adding Dermabond. Return as needed.    ___________________________________________ Ihor Austin. Benjamin Stain, M.D., ABFM., CAQSM. Primary Care and Sports Medicine  [facility]  Adjunct Instructor of Family Medicine  [employer]

## 2021-06-07 DIAGNOSIS — Z79891 Long term (current) use of opiate analgesic: Secondary | ICD-10-CM | POA: Diagnosis not present

## 2021-06-07 DIAGNOSIS — G894 Chronic pain syndrome: Secondary | ICD-10-CM | POA: Diagnosis not present

## 2021-06-08 DIAGNOSIS — F32A Depression, unspecified: Secondary | ICD-10-CM | POA: Diagnosis not present

## 2021-06-08 DIAGNOSIS — K219 Gastro-esophageal reflux disease without esophagitis: Secondary | ICD-10-CM | POA: Diagnosis not present

## 2021-06-08 DIAGNOSIS — H25811 Combined forms of age-related cataract, right eye: Secondary | ICD-10-CM | POA: Diagnosis not present

## 2021-06-08 DIAGNOSIS — G894 Chronic pain syndrome: Secondary | ICD-10-CM | POA: Diagnosis not present

## 2021-06-08 DIAGNOSIS — N390 Urinary tract infection, site not specified: Secondary | ICD-10-CM | POA: Diagnosis not present

## 2021-06-08 DIAGNOSIS — M5441 Lumbago with sciatica, right side: Secondary | ICD-10-CM | POA: Diagnosis not present

## 2021-06-08 DIAGNOSIS — Z8739 Personal history of other diseases of the musculoskeletal system and connective tissue: Secondary | ICD-10-CM | POA: Diagnosis not present

## 2021-06-08 DIAGNOSIS — H25812 Combined forms of age-related cataract, left eye: Secondary | ICD-10-CM | POA: Diagnosis not present

## 2021-06-08 DIAGNOSIS — Z8679 Personal history of other diseases of the circulatory system: Secondary | ICD-10-CM | POA: Diagnosis not present

## 2021-06-08 DIAGNOSIS — S2241XD Multiple fractures of ribs, right side, subsequent encounter for fracture with routine healing: Secondary | ICD-10-CM | POA: Diagnosis not present

## 2021-06-08 DIAGNOSIS — K589 Irritable bowel syndrome without diarrhea: Secondary | ICD-10-CM | POA: Diagnosis not present

## 2021-06-08 DIAGNOSIS — G4700 Insomnia, unspecified: Secondary | ICD-10-CM | POA: Diagnosis not present

## 2021-06-08 DIAGNOSIS — E785 Hyperlipidemia, unspecified: Secondary | ICD-10-CM | POA: Diagnosis not present

## 2021-06-08 DIAGNOSIS — S3282XD Multiple fractures of pelvis without disruption of pelvic ring, subsequent encounter for fracture with routine healing: Secondary | ICD-10-CM | POA: Diagnosis not present

## 2021-06-08 DIAGNOSIS — I10 Essential (primary) hypertension: Secondary | ICD-10-CM | POA: Diagnosis not present

## 2021-06-08 DIAGNOSIS — Z9181 History of falling: Secondary | ICD-10-CM | POA: Diagnosis not present

## 2021-06-08 DIAGNOSIS — H52221 Regular astigmatism, right eye: Secondary | ICD-10-CM | POA: Diagnosis not present

## 2021-06-15 ENCOUNTER — Telehealth: Payer: Self-pay | Admitting: Lab

## 2021-06-15 ENCOUNTER — Other Ambulatory Visit: Payer: Self-pay | Admitting: Adult Health

## 2021-06-15 DIAGNOSIS — F411 Generalized anxiety disorder: Secondary | ICD-10-CM

## 2021-06-15 NOTE — Progress Notes (Signed)
  Chronic Care Management   Outreach Note  06/15/2021 Name: Carolyn Hendrix MRN: [id number] DOB: Feb 16, 1941  Referred by: Agapito Games, MD Reason for referral : Medication Management   An unsuccessful telephone outreach was attempted today. The patient was referred to the pharmacist for assistance with care management and care coordination.   Follow Up Plan:   Carolyn Hendrix  Upstream Scheduler

## 2021-06-16 NOTE — Telephone Encounter (Signed)
Pt has an appt on 8/29

## 2021-06-16 NOTE — Telephone Encounter (Signed)
Please schedule appt

## 2021-06-17 NOTE — Telephone Encounter (Signed)
Last filled 7/7 appt on 8/29

## 2021-06-19 ENCOUNTER — Other Ambulatory Visit: Payer: Self-pay | Admitting: Family Medicine

## 2021-06-19 DIAGNOSIS — F5101 Primary insomnia: Secondary | ICD-10-CM

## 2021-06-20 ENCOUNTER — Ambulatory Visit: Payer: Medicare Other | Admitting: Adult Health

## 2021-06-21 ENCOUNTER — Telehealth: Payer: Self-pay | Admitting: Lab

## 2021-06-21 NOTE — Progress Notes (Signed)
  Chronic Care Management   Outreach Note  06/21/2021 Name: Carolyn Hendrix MRN: [id number] DOB: [birth year]  Referred by: Agapito Games, MD Reason for referral : Medication Management   Third unsuccessful telephone outreach was attempted today. The patient was referred to the pharmacist for assistance with care management and care coordination.   Follow Up Plan:   [name]  Upstream Scheduler

## 2021-07-01 ENCOUNTER — Ambulatory Visit (INDEPENDENT_AMBULATORY_CARE_PROVIDER_SITE_OTHER): Payer: Medicare Other | Admitting: Sports Medicine

## 2021-07-01 ENCOUNTER — Ambulatory Visit: Payer: Self-pay

## 2021-07-01 DIAGNOSIS — M4727 Other spondylosis with radiculopathy, lumbosacral region: Secondary | ICD-10-CM

## 2021-07-01 DIAGNOSIS — M75102 Unspecified rotator cuff tear or rupture of left shoulder, not specified as traumatic: Secondary | ICD-10-CM

## 2021-07-01 DIAGNOSIS — M12812 Other specific arthropathies, not elsewhere classified, left shoulder: Secondary | ICD-10-CM | POA: Diagnosis not present

## 2021-07-01 DIAGNOSIS — S32599D Other specified fracture of unspecified pubis, subsequent encounter for fracture with routine healing: Secondary | ICD-10-CM

## 2021-07-01 NOTE — Assessment & Plan Note (Signed)
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 Return as needed for this.

## 2021-07-01 NOTE — Assessment & Plan Note (Signed)
Now approximately 13 months post bilateral superior and inferior pubic rami fractures, doing well, discharged from home health PT.

## 2021-07-01 NOTE — Progress Notes (Signed)
    Procedures performed today:    Procedure: Real-time Ultrasound Guided injection of the right sacroiliac joint Device: Samsung HS60  Verbal informed consent obtained.  Time-out conducted.  Noted no overlying erythema, induration, or other signs of local infection.  Skin prepped in a sterile fashion.  Local anesthesia: Topical Ethyl chloride.  With sterile technique and under real time ultrasound guidance: Noted arthritic joint, taking care to avoid a spinal cord stimulator advanced a 22-gauge needle into the SI joint and injected 1 cc Kenalog 40, 2 cc lidocaine, 2 cc bupivacaine injected easily Completed without difficulty  Advised to call if fevers/chills, erythema, induration, drainage, or persistent bleeding.  Images permanently stored and available for review in PACS.  Impression: Technically successful ultrasound guided injection.  Independent interpretation of notes and tests performed by another provider:   None.  Brief History, Exam, Impression, and Recommendations:    Pelvic fracture (HCC) Now approximately 13 months post bilateral superior and inferior pubic rami fractures, doing well, discharged from home health PT.  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 Return as needed for this.  Lumbosacral spondylosis Carolyn Hendrix has a lumbar fusion, spinal cord stimulator, she has been getting SI joint injections with Dr. Oneal Grout, was told they are no longer covered by her insurance, pain is over the right SI joint, she tells me the shots of helped in the past, today we formed a right sacroiliac joint injection with ultrasound guidance, return in a month. If she gets good relief we can do the left side.    ___________________________________________ Ihor Austin. Benjamin Stain, M.D., ABFM., CAQSM. Primary Care and  Sports Medicine [facility]  Adjunct Instructor of Family Medicine  [employer]

## 2021-07-01 NOTE — Assessment & Plan Note (Signed)
Carolyn Hendrix has a lumbar fusion, spinal cord stimulator, she has been getting SI joint injections with Dr. Oneal Grout, was told they are no longer covered by her insurance, pain is over the right SI joint, she tells me the shots of helped in the past, today we formed a right sacroiliac joint injection with ultrasound guidance, return in a month. If she gets good relief we can do the left side.

## 2021-07-06 ENCOUNTER — Other Ambulatory Visit: Payer: Self-pay

## 2021-07-06 ENCOUNTER — Ambulatory Visit: Payer: Medicare Other | Admitting: Adult Health

## 2021-07-06 ENCOUNTER — Encounter: Payer: Self-pay | Admitting: Adult Health

## 2021-07-06 DIAGNOSIS — F411 Generalized anxiety disorder: Secondary | ICD-10-CM | POA: Diagnosis not present

## 2021-07-06 DIAGNOSIS — F339 Major depressive disorder, recurrent, unspecified: Secondary | ICD-10-CM

## 2021-07-06 DIAGNOSIS — G4700 Insomnia, unspecified: Secondary | ICD-10-CM | POA: Diagnosis not present

## 2021-07-06 NOTE — Progress Notes (Signed)
Carolyn Hendrix [id number] 09-04-1941 80 y.o.  Subjective:   Patient ID:  Carolyn Hendrix is a 80 y.o. (DOB Dec 22, 1940) female.  Chief Complaint: No chief complaint on file.   HPI Carolyn Hendrix presents to the office today for follow-up of MDD, GAD, and insomnia.   Describes mood today as "ok". Pleasant. Denies tearfulness. Mood symptoms - reports some depression - "I'm still having down days". Not feeling as anxious. Stating "I'm still thinking about things I shouldn't". Denies irritability. Feels like medications are helpful. Granddaughter staying with her during the day. Talking to daughter daily. Varying interest and motivation. Taking medications as prescribed.  Energy levels improved. Active, does not have a regular exercise routine.  Not able to enjoy usual interests and activities. Widowed. Lives alone. Has 2 children. Spending time with family. Appetite adequate. Weight gain - 150 to 157 pounds.  Sleeps well most nights. Averages 8 to 9 hours. Stating "I have been sleeping more". Focus and concentration "fair". Completing tasks. Managing some aspects of household. Retired from [employer]. Denies SI or HI.  Denies AH or VH.  No longer seeing a therapist.  Previous medication trials: Buspar, Cymbalta   GAD-7    Flowsheet Row Office Visit from 05/26/2021 in [facility] At [facility] Office Visit from 02/22/2021 in [facility] Primary [facility] At [facility] Video Visit from 01/20/2021 in [facility] Primary Care At [facility] Video Visit from 06/21/2020 in [facility] Primary Care At [facility] Video Visit from 04/14/2020 in [facility] At [facility]  Total GAD-7 Score 2 2 7 11 9       Mini-Mental    Flowsheet Row Office Visit from 09/23/2014 in [facility] At [facility]  Total Score (max 30 points ) 0      PHQ2-9    Flowsheet Row Office Visit from 05/26/2021 in [facility] Primary  Care At [facility] Office Visit from 02/22/2021 in [facility] At [facility] Video Visit from 01/20/2021 in [facility] Primary Care At [facility] Office Visit from 11/29/2020 in [facility] Primary Care At [facility] Video Visit from 06/21/2020 in [facility] Primary Care At [facility]  PHQ-2 Total Score 2 2 5 4 3   PHQ-9 Total Score 4 5 14 9 13       Flowsheet Row ED from 12/20/2020 in [facility] RISK CATEGORY No Risk        Review of Systems:  Review of Systems  Musculoskeletal:  Negative for gait problem.  Neurological:  Negative for tremors.  Psychiatric/Behavioral:         Please refer to HPI   Medications: I have reviewed the patient's current medications.  Current Outpatient Medications  Medication Sig Dispense Refill   acyclovir ointment (ZOVIRAX) 5 % Apply 1 application topically every 3 (three) hours. 5 g 2   alendronate (FOSAMAX) 70 MG tablet Take 1 tablet (70 mg total) by mouth every 7 (seven) days. Take with a full glass of water on an empty stomach. 12 tablet 4   ALPRAZolam (XANAX) 0.25 MG tablet TAKE 1 TABLET BY MOUTH TWICE A DAY AS NEEDED FOR ANXIETY 60 tablet 0   buPROPion (WELLBUTRIN XL) 150 MG 24 hr tablet Take 1 tablet (150 mg total) by mouth daily. 90 tablet 0   Cholecalciferol (VITAMIN D3) 25 MCG (1000 UT) CAPS 1 capsule     dicyclomine (BENTYL) 10 MG  capsule TAKE 1 CAPSULE BY MOUTH THREE TIMES A DAY AS NEEDED FOR SPASMS 270 capsule 0   glucosamine-chondroitin 500-400 MG tablet Take 1 tablet by mouth 2 (two) times daily.     Kava, Piper methysticum, (KAVA KAVA) 200 MG CAPS See admin instructions.     Magnesium 300 MG CAPS 1 capsule with a meal     methocarbamol (ROBAXIN) 750 MG tablet Take 1 tablet by mouth every 12 (twelve) hours.     Multiple Vitamins-Minerals (SENTRY) TABS See admin instructions.     omeprazole (PRILOSEC) 20 MG capsule TAKE 1 CAPSULE BY MOUTH EVERY DAY 90  capsule 3   oxyCODONE-acetaminophen (PERCOCET) 10-325 MG tablet Take 1 tablet by mouth every 6 (six) hours as needed for pain.     pravastatin (PRAVACHOL) 40 MG tablet TAKE 1 TABLET BY MOUTH EVERY DAY 90 tablet 3   Probiotic Product (PROBIOTIC-10 PO) See admin instructions.     pseudoephedrine (SUDAFED) 60 MG tablet 1 tablet as needed     sertraline (ZOLOFT) 100 MG tablet TAKE 1 TABLET BY MOUTH EVERY DAY 90 tablet 2   traMADol (ULTRAM-ER) 200 MG 24 hr tablet Take 200 mg by mouth daily.     traZODone (DESYREL) 100 MG tablet TAKE 1 TABLET BY MOUTH EVERYDAY AT BEDTIME 90 tablet 0   TURMERIC PO Take by mouth daily.     No current facility-administered medications for this visit.    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   Please see review of systems for further details on the patient's review from today.   Objective:   Physical Exam:  There were no vitals taken for this visit.  Physical Exam Constitutional:      General: She is not in acute distress. Musculoskeletal:        General: No deformity.  Neurological:     Mental Status: She is alert and oriented to person, place, and time.     Coordination: Coordination normal.  Psychiatric:        Attention and Perception: Attention and perception normal. She does not perceive auditory or visual hallucinations.        Mood and Affect: Mood normal. Mood is not anxious or depressed. Affect is not  labile, blunt, angry or inappropriate.        Speech: Speech normal.        Behavior: Behavior normal.        Thought Content: Thought content normal. Thought content is not paranoid or delusional. Thought content does not include homicidal or suicidal ideation. Thought content does not include homicidal or suicidal plan.        Cognition and Memory: Cognition and memory normal.        Judgment: Judgment normal.     Comments: Insight intact    Lab Review:     Component Value Date/Time   NA 143 12/24/2020  0000   K 4.5 12/24/2020 0000   CL 105 12/24/2020 0000   CO2 28 12/24/2020 0000   GLUCOSE 93 12/24/2020 0000   BUN 27 (H) 12/24/2020 0000   CREATININE 1.11 (H) 12/24/2020 0000   CALCIUM 9.2 12/24/2020 0000   PROT 6.4 12/24/2020 0000   ALBUMIN 4.3 11/10/2016 0935   AST 16 12/24/2020 0000   ALT 17 12/24/2020 0000   ALKPHOS 80 11/10/2016 0935   BILITOT 0.3 12/24/2020 0000   GFRNONAA 48 (L) 12/24/2020 0000   GFRAA 55 (L) 12/24/2020 0000       Component Value Date/Time   WBC 8.7 12/24/2020 0000   RBC 4.37 12/24/2020 0000   HGB 13.5 12/24/2020 0000   HCT 40.7 12/24/2020 0000   PLT 324 12/24/2020 0000   MCV 93.1 12/24/2020 0000   MCH 30.9 12/24/2020 0000   MCHC 33.2 12/24/2020 0000   RDW 13.0 12/24/2020 0000   LYMPHSABS 1,224 09/23/2019 1455   MONOABS 399 11/10/2016 0935   EOSABS 12 (L) 09/23/2019 1455   BASOSABS 48 09/23/2019 1455    No results found for: POCLITH, LITHIUM   No results found for: PHENYTOIN, PHENOBARB, VALPROATE, CBMZ   .res Assessment: Plan:    Plan:  PDMP reviewed  1. Lexapro 20mg  daily 2. Wellbutrin XL 150mg  daily 3. Trazadone 100mg  at hs for sleep  4. Xanax 0.25mg  BID - takes occasionally  RTC 3 months  Patient advised to contact office with any questions, adverse effects, or acute worsening in signs and symptoms.  Discussed potential benefits, risk, and side effects of benzodiazepines to include potential risk of tolerance and  dependence, as well as possible drowsiness.  Advised patient not to drive if experiencing drowsiness and to take lowest possible effective dose to minimize risk of dependence and tolerance.  There are no diagnoses linked to this encounter.   Please see After Visit Summary for patient specific instructions.  Future Appointments  Date Time Provider Department Center  07/26/2021  1:40 PM , MD [facility] None  08/01/2021  2:15 PM 09/25/2021, MD [facility] None  10/05/2021 11:20 AM Peytyn Trine, 10/01/2021, NP [facility] None    No orders of the defined types were placed in this encounter.   -------------------------------

## 2021-07-12 ENCOUNTER — Other Ambulatory Visit: Payer: Self-pay | Admitting: Family Medicine

## 2021-07-25 ENCOUNTER — Other Ambulatory Visit: Payer: Self-pay | Admitting: Adult Health

## 2021-07-25 DIAGNOSIS — F411 Generalized anxiety disorder: Secondary | ICD-10-CM

## 2021-07-25 NOTE — Telephone Encounter (Signed)
Last filled 8/26 appt on 12/14

## 2021-07-26 ENCOUNTER — Encounter: Payer: Self-pay | Admitting: Family Medicine

## 2021-07-26 ENCOUNTER — Ambulatory Visit (INDEPENDENT_AMBULATORY_CARE_PROVIDER_SITE_OTHER): Payer: Medicare Other | Admitting: Family Medicine

## 2021-07-26 ENCOUNTER — Other Ambulatory Visit: Payer: Self-pay

## 2021-07-26 VITALS — BP 124/70 | HR 90 | Ht 66.0 in | Wt 160.0 lb

## 2021-07-26 DIAGNOSIS — F32A Depression, unspecified: Secondary | ICD-10-CM | POA: Diagnosis not present

## 2021-07-26 DIAGNOSIS — Z23 Encounter for immunization: Secondary | ICD-10-CM | POA: Diagnosis not present

## 2021-07-26 DIAGNOSIS — F5101 Primary insomnia: Secondary | ICD-10-CM | POA: Diagnosis not present

## 2021-07-26 DIAGNOSIS — K581 Irritable bowel syndrome with constipation: Secondary | ICD-10-CM

## 2021-07-26 DIAGNOSIS — M8580 Other specified disorders of bone density and structure, unspecified site: Secondary | ICD-10-CM | POA: Diagnosis not present

## 2021-07-26 NOTE — Assessment & Plan Note (Signed)
Will continue to monitor.

## 2021-07-26 NOTE — Assessment & Plan Note (Signed)
Follows with behavioral health.  Currently on reduced dose of Wellbutrin and tolerating well.  Also on as needed alprazolam as well as sertraline.

## 2021-07-26 NOTE — Progress Notes (Signed)
Established Patient Office Visit  Subjective:  Patient ID: Carolyn Hendrix, female    DOB: [birth year]  Age: 80 y.o. MRN: [id number]  CC:  Chief Complaint  Patient presents with   Follow-up         HPI Carolyn Hendrix presents for Mood.  She is really overall doing well she says she started driving again a couple of weeks ago just to the drugstore and to the doctor's office she says she has not tach on getting on 40 yet but is otherwise doing well.  She did see her nurse practitioner for behavioral health and they have decreased her Wellbutrin down to 150 mg.  We had discussed previously maybe at least giving this a try I am not sure how effective it is really been for her and so for able to taper it off without any significant changes that I think is one less medication that would have potential interactions especially with her tramadol.  She is also on alprazolam with her behavioral health specialist.  She does try to use it infrequently.  Her pain management provider recently switched her tramadol to an extended release product.  She said she really did not note any noticed any difference between the regular and the long-acting and says it was actually more expensive.  She does still use oxycodone as well.  Reports IBS has been ramped up lately.  She says she will wake up first in the morning and go to the bathroom and then suddenly get pain and cramping again and feels like she needs to go again it just seems to ramp.  She says she is tries to be good as far as no major changes in her diet in fact she is actually cut out caffeine and has switched to peppermint tea.  She denies any blood in her stool.  She really tries to limit gluten  Has been tolerating the Fosamax well without any side effects or problems she also takes calcium with vitamin D.  Her granddaughter is very supportive.  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 Lorain Childes   lapartomy  1995   OTHER SURGICAL HISTORY  1992, 1994   right breast biopsy- benign     SPINAL FUSION  08/18/97   Dr. Dorita Fray at [facility], L4-5 post fusion with iliac crest bone graft   TONSILLECTOMY AND ADENOIDECTOMY      Family History  Problem Relation Age of Onset   Alzheimer's disease Mother    Other Father        ACS? - 58's   Ataxia Sister     Social History   Socioeconomic History   Marital status: Widowed    Spouse name: [name]   Number of children: 2   Years of education:  12   Highest education level: 12th grade  Occupational History   Occupation: retired    Comment: Print production planner for [employer]  Tobacco Use   Smoking status: Former    Types: Cigarettes    Quit date: 10/23/1980    Years since quitting: 40.7   Smokeless tobacco: Never  Vaping Use   Vaping Use: Never used  Substance and Sexual Activity   Alcohol use: No   Drug use: No   Sexual activity: Not Currently  Other Topics Concern   Not on file  Social History Narrative   Her husband passed away in 10-10-20. She is getting better at taking care of herself as she had to take care of her husband for the past 2.5 years. She lives alone with her dog.    Social Determinants of Health   Financial Resource Strain: Low Risk    Difficulty of Paying Living Expenses: Not hard at all  Food Insecurity: No Food Insecurity   Worried About Programme researcher, broadcasting/film/video in the Last  Year: Never true   Ran Out of Food in the Last Year: Never true  Transportation Needs: No Transportation Needs   Lack of Transportation (Medical): No   Lack of Transportation (Non-Medical): No  Physical Activity: Inactive   Days of Exercise per Week: 0 days   Minutes of Exercise per Session: 0 min  Stress: No Stress Concern Present   Feeling of Stress : Not at all  Social Connections: Moderately Isolated   Frequency of Communication with Friends and Family: More than three times a week   Frequency of Social Gatherings with Friends and Family: Never   Attends Religious Services: 1 to 4 times per year   Active Member of [employer] or Organizations: No   Attends Banker Meetings: Never   Marital Status: Widowed  Catering manager Violence: Not At Risk   Fear of Current or Ex-Partner: No   Emotionally Abused: No   Physically Abused: No   Sexually Abused: No    Outpatient Medications Prior to Visit  Medication Sig Dispense Refill   oxyCODONE-acetaminophen (PERCOCET) 7.5-325 MG tablet SMARTSIG:1 Tablet(s) By Mouth     acyclovir ointment (ZOVIRAX) 5 % Apply 1 application topically every 3 (three) hours. 5 g 2   alendronate (FOSAMAX) 70 MG tablet Take 1 tablet (70 mg total) by mouth every 7 (seven) days. Take with a full glass of water on an empty stomach. 12 tablet 4   ALPRAZolam (XANAX) 0.25 MG tablet TAKE 1 TABLET BY MOUTH TWICE A DAY AS NEEDED FOR ANXIETY 60 tablet 0   buPROPion (WELLBUTRIN XL) 150 MG 24 hr tablet Take 1 tablet (150 mg total) by mouth daily. 90 tablet 0   Cholecalciferol (VITAMIN D3) 25 MCG (1000 UT) CAPS 1 capsule     dicyclomine (BENTYL) 10 MG capsule TAKE 1 CAPSULE BY MOUTH THREE TIMES A DAY AS NEEDED FOR SPASMS 270 capsule 0   glucosamine-chondroitin 500-400 MG tablet Take 1 tablet by mouth 2 (two) times daily.     Kava, Piper methysticum, (KAVA KAVA) 200 MG CAPS See admin instructions.     Magnesium 300 MG CAPS 1 capsule with a meal     methocarbamol  (ROBAXIN) 750 MG tablet Take 1 tablet by mouth every 12 (twelve) hours.     Multiple Vitamins-Minerals (SENTRY) TABS See admin instructions.     omeprazole (PRILOSEC) 20 MG capsule TAKE 1 CAPSULE BY MOUTH EVERY DAY 90 capsule 3  pravastatin (PRAVACHOL) 40 MG tablet TAKE 1 TABLET BY MOUTH EVERY DAY 90 tablet 3   Probiotic Product (PROBIOTIC-10 PO) See admin instructions.     pseudoephedrine (SUDAFED) 60 MG tablet 1 tablet as needed     sertraline (ZOLOFT) 100 MG tablet TAKE 1 TABLET BY MOUTH EVERY DAY 90 tablet 2   traMADol (ULTRAM-ER) 200 MG 24 hr tablet Take 200 mg by mouth daily.     traZODone (DESYREL) 100 MG tablet TAKE 1 TABLET BY MOUTH EVERYDAY AT BEDTIME 90 tablet 0   TURMERIC PO Take by mouth daily.     oxyCODONE-acetaminophen (PERCOCET) 10-325 MG tablet Take 1 tablet by mouth every 6 (six) hours as needed for pain.     No facility-administered medications prior to visit.    Allergies  Allergen Reactions   Cymbalta [Duloxetine Hcl] Diarrhea   Morphine Hives    ROS Review of Systems    Objective:    Physical Exam  BP 124/70   Pulse 90   Ht 5\' 6"  (1.676 m)   Wt 160 lb (72.6 kg)   SpO2 94%   BMI 25.82 kg/m  Wt Readings from Last 3 Encounters:  07/26/21 160 lb (72.6 kg)  05/26/21 160 lb (72.6 kg)  02/22/21 160 lb (72.6 kg)     Health Maintenance Due  Topic Date Due   COVID-19 Vaccine (4 - Booster for Pfizer series) 11/16/2020    There are no preventive care reminders to display for this patient.  Lab Results  Component Value Date   TSH 1.81 11/16/2017   Lab Results  Component Value Date   WBC 8.7 12/24/2020   HGB 13.5 12/24/2020   HCT 40.7 12/24/2020   MCV 93.1 12/24/2020   PLT 324 12/24/2020   Lab Results  Component Value Date   NA 143 12/24/2020   K 4.5 12/24/2020   CO2 28 12/24/2020   GLUCOSE 93 12/24/2020   BUN 27 (H) 12/24/2020   CREATININE 1.11 (H) 12/24/2020   BILITOT 0.3 12/24/2020   ALKPHOS 80 11/10/2016   AST 16 12/24/2020   ALT  17 12/24/2020   PROT 6.4 12/24/2020   ALBUMIN 4.3 11/10/2016   CALCIUM 9.2 12/24/2020   Lab Results  Component Value Date   CHOL 179 12/24/2020   Lab Results  Component Value Date   HDL 47 (L) 12/24/2020   Lab Results  Component Value Date   LDLCALC 109 (H) 12/24/2020   Lab Results  Component Value Date   TRIG 122 12/24/2020   Lab Results  Component Value Date   CHOLHDL 3.8 12/24/2020   No results found for: HGBA1C    Assessment & Plan:   Problem List Items Addressed This Visit       Digestive   IRRITABLE BOWEL SYNDROME    Sounds like her IBS has been flaring recently we discussed maybe a trial of IBgard.  She is also cut out caffeine recently which is helpful.  No blood in the stool or red flag symptoms.        Musculoskeletal and Integument   Osteopenia    Tolerating Fosamax well without any side effects or problems.  Also on calcium with vitamin D.        Other   Primary insomnia    Will continue to monitor.        Depressive disorder - Primary    Follows with behavioral health.  Currently on reduced dose of Wellbutrin and tolerating well.  Also on as needed alprazolam  as well as sertraline.      Other Visit Diagnoses     Need for immunization against influenza       Relevant Orders   Flu Vaccine QUAD High Dose(Fluad) (Completed)       No orders of the defined types were placed in this encounter.   Follow-up: Return in about 6 months (around 01/24/2022) for Physical .    Nani Gasser, MD

## 2021-07-26 NOTE — Assessment & Plan Note (Addendum)
Sounds like her IBS has been flaring recently we discussed maybe a trial of IBgard.  She is also cut out caffeine recently which is helpful.  No blood in the stool or red flag symptoms.

## 2021-07-26 NOTE — Assessment & Plan Note (Signed)
Tolerating Fosamax well without any side effects or problems.  Also on calcium with vitamin D.

## 2021-08-01 ENCOUNTER — Ambulatory Visit (INDEPENDENT_AMBULATORY_CARE_PROVIDER_SITE_OTHER): Payer: Medicare Other | Admitting: Sports Medicine

## 2021-08-01 DIAGNOSIS — M47897 Other spondylosis, lumbosacral region: Secondary | ICD-10-CM

## 2021-08-01 NOTE — Assessment & Plan Note (Signed)
Carolyn Hendrix returns, she is a very pleasant 80 year old female with a history of a lumbar fusion, spinal cord stimulator, she has been getting SI joint injections with Dr. Oneal Grout, she was told they were no longer covered by her insurance, at the last visit we went ahead and did an ultrasound-guided SI joint injection, I suspect insurance was no longer covering fluoroscopy-guided. She returns today doing extremely well, she is completely pain-free, return as needed.

## 2021-08-01 NOTE — Progress Notes (Signed)
    Procedures performed today:    None.  Independent interpretation of notes and tests performed by another provider:   None.  Brief History, Exam, Impression, and Recommendations:    Lumbosacral spondylosis Carolyn Hendrix returns, she is a very pleasant 80 year old female with a history of a lumbar fusion, spinal cord stimulator, she has been getting SI joint injections with Dr. Oneal Grout, she was told they were no longer covered by her insurance, at the last visit we went ahead and did an ultrasound-guided SI joint injection, I suspect insurance was no longer covering fluoroscopy-guided. She returns today doing extremely well, she is completely pain-free, return as needed.    ___________________________________________ Carolyn Hendrix. Carolyn Hendrix, M.D., ABFM., CAQSM. Primary Care and Sports Medicine [facility]  Adjunct Instructor of Family Medicine  [employer]

## 2021-08-03 DIAGNOSIS — M961 Postlaminectomy syndrome, not elsewhere classified: Secondary | ICD-10-CM | POA: Diagnosis not present

## 2021-08-03 DIAGNOSIS — M47816 Spondylosis without myelopathy or radiculopathy, lumbar region: Secondary | ICD-10-CM | POA: Diagnosis not present

## 2021-08-03 DIAGNOSIS — G894 Chronic pain syndrome: Secondary | ICD-10-CM | POA: Diagnosis not present

## 2021-08-03 DIAGNOSIS — M5136 Other intervertebral disc degeneration, lumbar region: Secondary | ICD-10-CM | POA: Diagnosis not present

## 2021-08-03 DIAGNOSIS — Z79891 Long term (current) use of opiate analgesic: Secondary | ICD-10-CM | POA: Diagnosis not present

## 2021-08-16 ENCOUNTER — Other Ambulatory Visit: Payer: Self-pay | Admitting: Family Medicine

## 2021-08-16 DIAGNOSIS — F5101 Primary insomnia: Secondary | ICD-10-CM

## 2021-08-22 ENCOUNTER — Other Ambulatory Visit: Payer: Self-pay | Admitting: Family Medicine

## 2021-08-22 ENCOUNTER — Other Ambulatory Visit: Payer: Self-pay | Admitting: Adult Health

## 2021-08-22 DIAGNOSIS — F411 Generalized anxiety disorder: Secondary | ICD-10-CM

## 2021-08-22 DIAGNOSIS — F32A Depression, unspecified: Secondary | ICD-10-CM

## 2021-08-23 NOTE — Telephone Encounter (Signed)
Last filled 10/4 appt on 12/14

## 2021-09-13 ENCOUNTER — Other Ambulatory Visit: Payer: Self-pay | Admitting: Family Medicine

## 2021-09-20 ENCOUNTER — Other Ambulatory Visit: Payer: Self-pay | Admitting: Family Medicine

## 2021-09-21 ENCOUNTER — Other Ambulatory Visit: Payer: Self-pay | Admitting: Adult Health

## 2021-09-21 DIAGNOSIS — F411 Generalized anxiety disorder: Secondary | ICD-10-CM

## 2021-10-03 DIAGNOSIS — M961 Postlaminectomy syndrome, not elsewhere classified: Secondary | ICD-10-CM | POA: Diagnosis not present

## 2021-10-03 DIAGNOSIS — Z79891 Long term (current) use of opiate analgesic: Secondary | ICD-10-CM | POA: Diagnosis not present

## 2021-10-03 DIAGNOSIS — M47816 Spondylosis without myelopathy or radiculopathy, lumbar region: Secondary | ICD-10-CM | POA: Diagnosis not present

## 2021-10-03 DIAGNOSIS — M5136 Other intervertebral disc degeneration, lumbar region: Secondary | ICD-10-CM | POA: Diagnosis not present

## 2021-10-03 DIAGNOSIS — G894 Chronic pain syndrome: Secondary | ICD-10-CM | POA: Diagnosis not present

## 2021-10-04 ENCOUNTER — Ambulatory Visit (INDEPENDENT_AMBULATORY_CARE_PROVIDER_SITE_OTHER): Payer: Medicare Other

## 2021-10-04 ENCOUNTER — Other Ambulatory Visit: Payer: Self-pay

## 2021-10-04 ENCOUNTER — Ambulatory Visit: Payer: Self-pay

## 2021-10-04 ENCOUNTER — Ambulatory Visit (INDEPENDENT_AMBULATORY_CARE_PROVIDER_SITE_OTHER): Payer: Medicare Other | Admitting: Sports Medicine

## 2021-10-04 DIAGNOSIS — M47897 Other spondylosis, lumbosacral region: Secondary | ICD-10-CM

## 2021-10-04 DIAGNOSIS — M12812 Other specific arthropathies, not elsewhere classified, left shoulder: Secondary | ICD-10-CM

## 2021-10-04 DIAGNOSIS — M75102 Unspecified rotator cuff tear or rupture of left shoulder, not specified as traumatic: Secondary | ICD-10-CM | POA: Diagnosis not present

## 2021-10-04 NOTE — Progress Notes (Signed)
° ° °  Procedures performed today:    Procedure: Real-time Ultrasound Guided injection of the right sacroiliac joint Device: Samsung HS60  Verbal informed consent obtained.  Time-out conducted.  Noted no overlying erythema, induration, or other signs of local infection.  Skin prepped in a sterile fashion.  Local anesthesia: Topical Ethyl chloride.  With sterile technique and under real time ultrasound guidance: Noted arthritic joint, taking care to avoid a spinal cord stimulator advanced a 22-gauge needle into the SI joint and injected 1 cc Kenalog 40, 2 cc lidocaine, 2 cc bupivacaine injected easily Completed without difficulty  Advised to call if fevers/chills, erythema, induration, drainage, or persistent bleeding.  Images permanently stored and available for review in PACS.  Impression: Technically successful ultrasound guided injection.  Procedure: Real-time Ultrasound Guided injection of the left glenohumeral joint Device: Samsung HS60  Verbal informed consent obtained.  Time-out conducted.  Noted no overlying erythema, induration, or other signs of local infection.  Skin prepped in a sterile fashion.  Local anesthesia: Topical Ethyl chloride.  With sterile technique and under real time ultrasound guidance: Noted slight arthritis, 1 cc Kenalog 40, 2 cc lidocaine, 2 cc bupivacaine injected easily Completed without difficulty  Advised to call if fevers/chills, erythema, induration, drainage, or persistent bleeding.  Images permanently stored and available for review in PACS.  Impression: Technically successful ultrasound guided injection.  Independent interpretation of notes and tests performed by another provider:   None.  Brief History, Exam, Impression, and Recommendations:    Lumbosacral spondylosis This is a pleasant 80 year old female with a complex lumbar history including lumbar fusion, spinal cord stimulator. Did really well after right sacroiliac joint injection back  in September. Repeat right sacroiliac joint injection today. We did discuss potentially using sacroiliac joint RFA in the future.  Rotator cuff tear arthropathy, left Also with rotator cuff tear arthropathy left shoulder, we did a glenohumeral joint injection back in August and she did extremely well, repeated today.    ___________________________________________ Ihor Austin. Benjamin Stain, M.D., ABFM., CAQSM. Primary Care and Sports Medicine [facility]  Adjunct Instructor of Family Medicine  [employer]

## 2021-10-04 NOTE — Assessment & Plan Note (Signed)
Also with rotator cuff tear arthropathy left shoulder, we did a glenohumeral joint injection back in August and she did extremely well, repeated today.

## 2021-10-04 NOTE — Assessment & Plan Note (Addendum)
This is a pleasant 80 year old female with a complex lumbar history including lumbar fusion, spinal cord stimulator. Did really well after right sacroiliac joint injection back in September. Repeat right sacroiliac joint injection today. We did discuss potentially using sacroiliac joint RFA in the future.

## 2021-10-05 ENCOUNTER — Encounter: Payer: Self-pay | Admitting: Adult Health

## 2021-10-05 ENCOUNTER — Telehealth (INDEPENDENT_AMBULATORY_CARE_PROVIDER_SITE_OTHER): Payer: Medicare Other | Admitting: Adult Health

## 2021-10-05 DIAGNOSIS — F339 Major depressive disorder, recurrent, unspecified: Secondary | ICD-10-CM | POA: Diagnosis not present

## 2021-10-05 DIAGNOSIS — G4700 Insomnia, unspecified: Secondary | ICD-10-CM

## 2021-10-05 DIAGNOSIS — F411 Generalized anxiety disorder: Secondary | ICD-10-CM

## 2021-10-05 NOTE — Progress Notes (Signed)
[name] [id number] [birth year] 80 y.o.  Virtual Visit via Video Note  I connected with pt @ on 10/05/21 at 11:20 AM EST by a video enabled telemedicine application and verified that I am speaking with the correct person using two identifiers.   I discussed the limitations of evaluation and management by telemedicine and the availability of in person appointments. The patient expressed understanding and agreed to proceed.  I discussed the assessment and treatment plan with the patient. The patient was provided an opportunity to ask questions and all were answered. The patient agreed with the plan and demonstrated an understanding of the instructions.   The patient was advised to call back or seek an in-person evaluation if the symptoms worsen or if the condition fails to improve as anticipated.  I provided 15 minutes of non-face-to-face time during this encounter.  The patient was located at home.  The provider was located at [place].   [name], NP   Subjective:   Patient ID:  Carolyn Hendrix is a 80 y.o. (DOB 1941/03/24) female.  Chief Complaint: No chief complaint on file.   HPI  [name] presents for follow-up of MDD, GAD, and insomnia.   Describes mood today as "ok". Pleasant. Denies tearfulness. Mood symptoms - reports some depression - "it's been a bad year". Feels anxious as times. Denies irritability. Stating "I feel hopeful". Family visiting over the holidays - "I am thankful for them". Reports increased pain issues. Hopeful the new year is better. Feels like medications are helpful. Varying interest and motivation. Taking medications as prescribed.  Energy levels improved. Active, does not have a regular exercise routine.  Not able to enjoy usual interests and activities. Widowed. Lives alone. Has 2 children. Spending time with family. Appetite adequate. Weight gain - 150 to 157 pounds.  Sleeps well most nights. Averages 8 to 9 hours.   Focus and concentration "ok". Completing tasks. Managing some aspects of household. Retired from [employer]. Denies SI or HI.  Denies AH or VH.  No longer seeing a therapist.  Previous medication trials: Buspar, Cymbalta   Review of Systems:  Review of Systems  Musculoskeletal:  Negative for gait problem.  Neurological:  Negative for tremors.  Psychiatric/Behavioral:         Please refer to HPI   Medications: I have reviewed the patient's current medications.  Current Outpatient Medications  Medication Sig Dispense Refill   acyclovir ointment (ZOVIRAX) 5 % Apply 1 application topically every 3 (three) hours. 5 g 2   alendronate (FOSAMAX) 70 MG tablet Take 1 tablet (70 mg total) by mouth every 7 (seven) days. Take with a full glass of water on an empty stomach. 12 tablet 4   ALPRAZolam (XANAX) 0.25 MG tablet TAKE 1 TABLET BY MOUTH TWICE A DAY AS NEEDED FOR ANXIETY 60 tablet 0   buPROPion (WELLBUTRIN XL) 150 MG 24 hr tablet TAKE 1 TABLET BY MOUTH EVERY DAY 90 tablet 0   Cholecalciferol (VITAMIN D3) 25 MCG (1000 UT) CAPS 1 capsule     dicyclomine (BENTYL) 10 MG capsule TAKE 1 CAPSULE BY MOUTH THREE TIMES A DAY AS NEEDED FOR SPASMS 270 capsule 0   glucosamine-chondroitin 500-400 MG tablet Take 1 tablet by mouth 2 (two) times daily.     Kava, Piper methysticum, (KAVA KAVA) 200 MG CAPS See admin instructions.     Magnesium 300 MG CAPS 1 capsule with a meal     methocarbamol (ROBAXIN) 750 MG  tablet Take 1 tablet by mouth every 12 (twelve) hours.     Multiple Vitamins-Minerals (SENTRY) TABS See admin instructions.     omeprazole (PRILOSEC) 20 MG capsule TAKE 1 CAPSULE BY MOUTH EVERY DAY 90 capsule 3   oxyCODONE-acetaminophen (PERCOCET) 7.5-325 MG tablet SMARTSIG:1 Tablet(s) By Mouth     pravastatin (PRAVACHOL) 40 MG tablet TAKE 1 TABLET BY MOUTH EVERY DAY 90 tablet 3   Probiotic Product (PROBIOTIC-10 PO) See admin instructions.     pseudoephedrine (SUDAFED) 60 MG tablet 1 tablet  as needed     sertraline (ZOLOFT) 100 MG tablet TAKE 1 TABLET BY MOUTH EVERY DAY 90 tablet 2   traMADol (ULTRAM-ER) 200 MG 24 hr tablet Take 200 mg by mouth daily.     traZODone (DESYREL) 100 MG tablet TAKE 1 TABLET BY MOUTH EVERYDAY AT BEDTIME 90 tablet 0   TURMERIC PO Take by mouth daily.     No current facility-administered medications for this visit.    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 She is getting better at taking care of herself as she had to take care of her husband for the past 2.5 years. She lives alone with her dog.    Social Determinants of Health   Financial Resource Strain: Low Risk    Difficulty of Paying Living Expenses: Not hard at all  Food  Insecurity: No Food Insecurity   Worried About Charity fundraiser in the Last Year: Never true   [place] in the Last Year: Never true  Transportation Needs: No Transportation Needs   Lack of Transportation (Medical): No   Lack of Transportation (Non-Medical): No  Physical Activity: Inactive   Days of Exercise per Week: 0 days   Minutes of Exercise per Session: 0 min  Stress: No Stress Concern Present   Feeling of Stress : Not at all  Social Connections: Moderately Isolated   Frequency of Communication with Friends and Family: More  than three times a week   Frequency of Social Gatherings with Friends and Family: Never   Attends Religious Services: 1 to 4 times per year   Active Member of [employer] or Organizations: No   Attends Banker Meetings: Never   Marital Status: Widowed  Catering manager Violence: Not At Risk   Fear of Current or Ex-Partner: No   Emotionally Abused: No   Physically Abused: No   Sexually Abused: No    Past Medical History, Surgical history, Social history, and Family history were reviewed and updated as appropriate.   Please see review of systems for further details on the patient's review from today.   Objective:   Physical Exam:  There were no vitals taken for this visit.  Physical Exam Constitutional:      General: She is not in acute distress. Musculoskeletal:        General: No deformity.  Neurological:     Mental Status: She is alert and oriented to person, place, and time.     Coordination: Coordination normal.  Psychiatric:        Attention and Perception: Attention and perception normal. She does not perceive auditory or visual hallucinations.        Mood and Affect: Mood normal. Mood is not anxious or depressed. Affect is not labile, blunt, angry or inappropriate.        Speech: Speech normal.        Behavior: Behavior normal.        Thought Content: Thought content normal. Thought content is not paranoid or delusional.  Thought content does not include homicidal or suicidal ideation. Thought content does not include homicidal or suicidal plan.        Cognition and Memory: Cognition and memory normal.        Judgment: Judgment normal.     Comments: Insight intact    Lab Review:     Component Value Date/Time   NA 143 12/24/2020 0000   K 4.5 12/24/2020 0000   CL 105 12/24/2020 0000   CO2 28 12/24/2020 0000   GLUCOSE 93 12/24/2020 0000   BUN 27 (H) 12/24/2020 0000   CREATININE 1.11 (H) 12/24/2020 0000   CALCIUM 9.2 12/24/2020 0000   PROT 6.4 12/24/2020 0000   ALBUMIN 4.3 11/10/2016 0935   AST 16 12/24/2020 0000   ALT 17 12/24/2020 0000   ALKPHOS 80 11/10/2016 0935   BILITOT 0.3 12/24/2020 0000   GFRNONAA 48 (L) 12/24/2020 0000   GFRAA 55 (L) 12/24/2020 0000       Component Value Date/Time   WBC 8.7 12/24/2020 0000   RBC 4.37 12/24/2020 0000   HGB 13.5 12/24/2020 0000   HCT 40.7 12/24/2020 0000   PLT 324 12/24/2020 0000   MCV 93.1 12/24/2020 0000   MCH 30.9 12/24/2020 0000   MCHC 33.2 12/24/2020 0000   RDW 13.0 12/24/2020 0000   LYMPHSABS 1,224 09/23/2019 1455   MONOABS 399 11/10/2016 0935   EOSABS 12 (L) 09/23/2019 1455   BASOSABS 48 09/23/2019 1455    No results found for: POCLITH, LITHIUM   No results found for: PHENYTOIN, PHENOBARB, VALPROATE, CBMZ   .res Assessment: Plan:    Plan:  PDMP reviewed  1. Lexapro 20mg  daily 2. Wellbutrin XL 150mg  daily 3. Trazadone 100mg  at hs for sleep  4. Xanax 0.25mg  BID - takes occasionally  RTC 3 months  Patient advised to contact office with any questions, adverse effects, or acute worsening in signs and symptoms.  Discussed potential benefits, risk, and side effects of benzodiazepines to include potential risk of tolerance and dependence, as well as possible drowsiness.  Advised patient not to drive if experiencing drowsiness and to take lowest possible effective dose to minimize risk of dependence and tolerance.  Diagnoses and all  orders for this visit:  Generalized anxiety disorder  Insomnia, unspecified type  Major depression, recurrent, chronic ([place])    Please see After Visit Summary for patient specific instructions.  Future Appointments  Date Time Provider [place]  01/24/2022  1:40 PM Hali Marry, MD [facility] None    No orders of the defined types were placed in this encounter.     -------------------------------

## 2021-10-20 ENCOUNTER — Other Ambulatory Visit: Payer: Self-pay | Admitting: Adult Health

## 2021-10-20 DIAGNOSIS — F411 Generalized anxiety disorder: Secondary | ICD-10-CM

## 2021-11-02 DIAGNOSIS — L821 Other seborrheic keratosis: Secondary | ICD-10-CM | POA: Diagnosis not present

## 2021-11-02 DIAGNOSIS — L820 Inflamed seborrheic keratosis: Secondary | ICD-10-CM | POA: Diagnosis not present

## 2021-11-02 DIAGNOSIS — Z85828 Personal history of other malignant neoplasm of skin: Secondary | ICD-10-CM | POA: Diagnosis not present

## 2021-11-02 DIAGNOSIS — L570 Actinic keratosis: Secondary | ICD-10-CM | POA: Diagnosis not present

## 2021-11-13 ENCOUNTER — Other Ambulatory Visit: Payer: Self-pay | Admitting: Family Medicine

## 2021-11-13 DIAGNOSIS — F32A Depression, unspecified: Secondary | ICD-10-CM

## 2021-11-13 DIAGNOSIS — F5101 Primary insomnia: Secondary | ICD-10-CM

## 2021-11-30 ENCOUNTER — Other Ambulatory Visit: Payer: Self-pay | Admitting: Family Medicine

## 2021-12-05 DIAGNOSIS — M5136 Other intervertebral disc degeneration, lumbar region: Secondary | ICD-10-CM | POA: Diagnosis not present

## 2021-12-05 DIAGNOSIS — Z79891 Long term (current) use of opiate analgesic: Secondary | ICD-10-CM | POA: Diagnosis not present

## 2021-12-05 DIAGNOSIS — M47816 Spondylosis without myelopathy or radiculopathy, lumbar region: Secondary | ICD-10-CM | POA: Diagnosis not present

## 2021-12-05 DIAGNOSIS — M961 Postlaminectomy syndrome, not elsewhere classified: Secondary | ICD-10-CM | POA: Diagnosis not present

## 2021-12-05 DIAGNOSIS — G894 Chronic pain syndrome: Secondary | ICD-10-CM | POA: Diagnosis not present

## 2021-12-20 ENCOUNTER — Other Ambulatory Visit: Payer: Self-pay

## 2021-12-20 ENCOUNTER — Ambulatory Visit (INDEPENDENT_AMBULATORY_CARE_PROVIDER_SITE_OTHER): Payer: Medicare Other | Admitting: Family Medicine

## 2021-12-20 ENCOUNTER — Encounter: Payer: Self-pay | Admitting: Family Medicine

## 2021-12-20 VITALS — BP 126/73 | HR 105 | Resp 16 | Ht 66.0 in | Wt 156.0 lb

## 2021-12-20 DIAGNOSIS — F32A Depression, unspecified: Secondary | ICD-10-CM | POA: Diagnosis not present

## 2021-12-20 DIAGNOSIS — G8929 Other chronic pain: Secondary | ICD-10-CM

## 2021-12-20 DIAGNOSIS — F339 Major depressive disorder, recurrent, unspecified: Secondary | ICD-10-CM

## 2021-12-20 DIAGNOSIS — M545 Low back pain, unspecified: Secondary | ICD-10-CM | POA: Diagnosis not present

## 2021-12-20 DIAGNOSIS — M47897 Other spondylosis, lumbosacral region: Secondary | ICD-10-CM | POA: Diagnosis not present

## 2021-12-20 DIAGNOSIS — M961 Postlaminectomy syndrome, not elsewhere classified: Secondary | ICD-10-CM

## 2021-12-20 MED ORDER — BUPROPION HCL ER (XL) 300 MG PO TB24: 300.0000 mg | ORAL_TABLET | Freq: Every day | ORAL | 1 refills | Status: DC

## 2021-12-20 NOTE — Progress Notes (Signed)
Established Patient Office Visit  Subjective:  Patient ID: Carolyn Hendrix, female    DOB: 1941-06-30  Age: 81 y.o. MRN: [id number]  CC:  Chief Complaint  Patient presents with   Referral    Dr. Harley Alto for SI joint pain    Anxiety    Discuss increasing dose on Wellbutrin     HPI Carolyn Hendrix presents for 34-month follow-up for depression.  Currently on sertraline and Wellbutrin and as needed alprazolam.  PHQ-9 score of 9 and GAD-7 score of 2 today.  No thoughts of wanting to harm herself. She would really like to consider going back up on her Wellbutrin.  She had previously on 300 but has been on the 150 for several months and feels like she would like to at least try going back up she still just feels like her anxiety is not well controlled currently.  But she also feels that a lot of her pain is aggravating her anxiety as well.  She follows with Dr. Kriste Basque for chronic pain.  But she is no longer in her network and so Dr. Selinda Orion had recommended possibly Dr. Dorene Ar.  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 Billey Chang   lapartomy  1995   OTHER SURGICAL HISTORY  1992, 1994   right breast biopsy- benign     SPINAL FUSION  08/18/97   Dr. Rowe Pavy at [facility], L4-5 post fusion with iliac crest bone graft   TONSILLECTOMY AND ADENOIDECTOMY      Family  History  Problem Relation Age of Onset   Alzheimer's disease Mother    Other Father        ACS? 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 She is getting better at taking care of herself as she had to take care of her husband for the past 2.5 years. She lives alone with her dog.    Social Determinants  of Health   Financial Resource Strain: Not on file  Food Insecurity: Not on file  Transportation Needs: Not on file  Physical Activity: Not on file  Stress: Not on file  Social Connections: Not on file  Intimate Partner Violence: Not on file    Outpatient Medications Prior to Visit  Medication Sig Dispense Refill   acyclovir ointment (ZOVIRAX) 5 % Apply 1 application topically every 3 (three) hours. 5 g 2   alendronate (FOSAMAX) 70 MG tablet Take 1 tablet (70 mg total) by mouth every 7 (seven) days. Take with a full glass of water on an empty stomach. 12 tablet 4   ALPRAZolam (XANAX) 0.25 MG tablet TAKE 1 TABLET BY MOUTH TWICE A DAY AS NEEDED FOR ANXIETY 60 tablet 2   Cholecalciferol (VITAMIN D3) 25 MCG (1000 UT) CAPS 1 capsule     dicyclomine (BENTYL) 10 MG capsule TAKE 1 CAPSULE BY MOUTH THREE TIMES A DAY AS NEEDED FOR SPASMS 270 capsule 0   glucosamine-chondroitin 500-400 MG tablet Take 1 tablet by mouth 2 (two) times daily.     Kava, Piper  methysticum, (KAVA KAVA) 200 MG CAPS See admin instructions.     Magnesium 300 MG CAPS 1 capsule with a meal     methocarbamol (ROBAXIN) 750 MG tablet Take 1 tablet by mouth every 12 (twelve) hours.     Multiple Vitamins-Minerals (SENTRY) TABS See admin instructions.     omeprazole (PRILOSEC) 20 MG capsule TAKE 1 CAPSULE BY MOUTH EVERY DAY 90 capsule 3   oxyCODONE-acetaminophen (PERCOCET) 7.5-325 MG tablet SMARTSIG:1 Tablet(s) By Mouth     pravastatin (PRAVACHOL) 40 MG tablet TAKE 1 TABLET BY MOUTH EVERY DAY 90 tablet 3   Probiotic Product (PROBIOTIC-10 PO) See admin instructions.     pseudoephedrine (SUDAFED) 60 MG tablet 1 tablet as needed     sertraline (ZOLOFT) 100 MG tablet TAKE 1 TABLET BY MOUTH EVERY DAY 90 tablet 1   traMADol (ULTRAM-ER) 200 MG 24 hr tablet Take 200 mg by mouth daily.     traZODone (DESYREL) 100 MG tablet TAKE 1 TABLET BY MOUTH EVERYDAY AT BEDTIME 90 tablet 0   TURMERIC PO Take by mouth daily.     buPROPion (WELLBUTRIN XL) 150 MG 24 hr tablet TAKE 1 TABLET BY MOUTH EVERY DAY 90 tablet 0   No facility-administered medications prior to visit.    Allergies  Allergen Reactions   Cymbalta [Duloxetine Hcl] Diarrhea   Morphine Hives    ROS Review of Systems    Objective:    Physical Exam Constitutional:      Appearance: Normal appearance. She is well-developed.  HENT:     Head: Normocephalic and atraumatic.  Cardiovascular:     Rate and Rhythm: Normal rate and regular rhythm.     Heart sounds: Normal heart sounds.  Pulmonary:     Effort: Pulmonary effort is normal.     Breath sounds: Normal breath sounds.  Skin:    General: Skin is warm and dry.  Neurological:     Mental Status: She is alert and oriented to person, place, and time.  Psychiatric:        Behavior: Behavior normal.    BP 126/73    Pulse (!) 105    Resp 16    Ht 5\' 6"  (1.676 m)    Wt 156 lb (70.8 kg)    SpO2 95%    BMI 25.18 kg/m  Wt Readings from Last 3 Encounters:  12/20/21 156 lb  (70.8  kg)  07/26/21 160 lb (72.6 kg)  05/26/21 160 lb (72.6 kg)     Health Maintenance Due  Topic Date Due   Hepatitis C Screening  Never done    There are no preventive care reminders to display for this patient.  Lab Results  Component Value Date   TSH 1.81 11/16/2017   Lab Results  Component Value Date   WBC 8.7 12/24/2020   HGB 13.5 12/24/2020   HCT 40.7 12/24/2020   MCV 93.1 12/24/2020   PLT 324 12/24/2020   Lab Results  Component Value Date   NA 143 12/24/2020   K 4.5 12/24/2020   CO2 28 12/24/2020   GLUCOSE 93 12/24/2020   BUN 27 (H) 12/24/2020   CREATININE 1.11 (H) 12/24/2020   BILITOT 0.3 12/24/2020   ALKPHOS 80 11/10/2016   AST 16 12/24/2020   ALT 17 12/24/2020   PROT 6.4 12/24/2020   ALBUMIN 4.3 11/10/2016   CALCIUM 9.2 12/24/2020   Lab Results  Component Value Date   CHOL 179 12/24/2020   Lab Results  Component Value Date   HDL 47 (L) 12/24/2020   Lab Results  Component Value Date   LDLCALC 109 (H) 12/24/2020   Lab Results  Component Value Date   TRIG 122 12/24/2020   Lab Results  Component Value Date   CHOLHDL 3.8 12/24/2020   No results found for: HGBA1C    Assessment & Plan:   Problem List Items Addressed This Visit       Musculoskeletal and Integument   Osteoarthritis   Relevant Orders   Ambulatory referral to Pain Clinic     Other   Postlaminectomy syndrome, not elsewhere classified   Relevant Orders   Ambulatory referral to Pain Clinic   Major depression, recurrent, chronic ([place])    We can certainly adjust the Wellbutrin back up and we can see if she feels like it is helpful if not then I recommend that we go back down.  She still continues to work with a Designer, jewellery with behavioral health but I write her medications for her.  She still continues to take a quarter of a tab of Xanax twice a day.      Relevant Medications   buPROPion (WELLBUTRIN XL) 300 MG 24 hr tablet   Low back pain - Primary   RESOLVED:  Depressive disorder   Relevant Medications   buPROPion (WELLBUTRIN XL) 300 MG 24 hr tablet    Meds ordered this encounter  Medications   buPROPion (WELLBUTRIN XL) 300 MG 24 hr tablet    Sig: Take 1 tablet (300 mg total) by mouth daily.    Dispense:  90 tablet    Refill:  1    Follow-up: Return in about 3 months (around 03/19/2022) for medication change follow up .    Beatrice Lecher, MD

## 2021-12-20 NOTE — Assessment & Plan Note (Addendum)
We can certainly adjust the Wellbutrin back up and we can see if she feels like it is helpful if not then I recommend that we go back down.  She still continues to work with a Publishing rights manager with behavioral health but I write her medications for her.  She still continues to take a quarter of a tab of Xanax twice a day.

## 2022-01-03 ENCOUNTER — Telehealth (INDEPENDENT_AMBULATORY_CARE_PROVIDER_SITE_OTHER): Payer: Medicare Other | Admitting: Adult Health

## 2022-01-03 ENCOUNTER — Encounter: Payer: Self-pay | Admitting: Adult Health

## 2022-01-03 DIAGNOSIS — F339 Major depressive disorder, recurrent, unspecified: Secondary | ICD-10-CM

## 2022-01-03 DIAGNOSIS — F411 Generalized anxiety disorder: Secondary | ICD-10-CM | POA: Diagnosis not present

## 2022-01-03 DIAGNOSIS — G4700 Insomnia, unspecified: Secondary | ICD-10-CM

## 2022-01-03 MED ORDER — ALPRAZOLAM 0.25 MG PO TABS: ORAL_TABLET | ORAL | 2 refills | Status: DC

## 2022-01-03 NOTE — Progress Notes (Signed)
Carolyn Hendrix ?[id number] ?10-28-1940 ?81 y.o. ? ?Virtual Visit via Video Note ? ?I connected with pt @ on 01/03/22 at 10:00 AM EDT by a video enabled telemedicine application and verified that I am speaking with the correct person using two identifiers. ?  ?I discussed the limitations of evaluation and management by telemedicine and the availability of in person appointments. The patient expressed understanding and agreed to proceed. ? ?I discussed the assessment and treatment plan with the patient. The patient was provided an opportunity to ask questions and all were answered. The patient agreed with the plan and demonstrated an understanding of the instructions. ?  ?The patient was advised to call back or seek an in-person evaluation if the symptoms worsen or if the condition fails to improve as anticipated. ? ?I provided 20 minutes of non-face-to-face time during this encounter.  The patient was located at home.  The provider was located at [place]. ? ? ?Carolyn Gell, NP  ? ?Subjective:  ? ?Patient ID:  Carolyn Hendrix is a 81 y.o. (DOB [birth year]) female. ? ?Chief Complaint: No chief complaint on file. ? ? ?HPI ?Carolyn Hendrix presents for follow-up of MDD, GAD, and insomnia.  ? ?Describes mood today as "ok". Pleasant. Denies tearfulness. Mood symptoms - reports depression. Feels anxious at times. Gets irritated at times - "when my daughter tells me what to do". Stating "I'm having a difficult time". Reports chronic pain issues. Feels like medications are helpful - using Xanax as needed. Reports increased pain issues - changing pain management provider. Feels like medications are helpful. Varying interest and motivation. Taking medications as prescribed.  ?Energy levels lower. Active, does not have a regular exercise routine. Trying to walk more. ?Not able to enjoy usual interests and activities. Widowed. Lives alone. Has 2 children - son local - daughter 2 hours away. Spending time with  family. ?Appetite adequate. Weight gain - 156 pounds.  ?Sleeps well most nights. Averages 8 to 9 hours.  ?Focus and concentration "ok". Completing tasks. Managing some aspects of household. Retired from [employer]. ?Denies SI or HI.  ?Denies AH or VH.  ? ?Previous medication trials: Buspar, Cymbalta ? ?Review of Systems:  ?Review of Systems  ?Musculoskeletal:  Negative for gait problem.  ?Neurological:  Negative for tremors.  ?Psychiatric/Behavioral:    ?     Please refer to HPI  ? ?Medications: I have reviewed the patient's current medications. ? ?Current Outpatient Medications  ?Medication Sig Dispense Refill  ? acyclovir ointment (ZOVIRAX) 5 % Apply 1 application topically every 3 (three) hours. 5 g 2  ? alendronate (FOSAMAX) 70 MG tablet Take 1 tablet (70 mg total) by mouth every 7 (seven) days. Take with a full glass of water on an empty stomach. 12 tablet 4  ? ALPRAZolam (XANAX) 0.25 MG tablet TAKE 1 TABLET BY MOUTH TWICE A DAY AS NEEDED FOR ANXIETY 60 tablet 2  ? buPROPion (WELLBUTRIN XL) 300 MG 24 hr tablet Take 1 tablet (300 mg total) by mouth daily. 90 tablet 1  ? Cholecalciferol (VITAMIN D3) 25 MCG (1000 UT) CAPS 1 capsule    ? dicyclomine (BENTYL) 10 MG capsule TAKE 1 CAPSULE BY MOUTH THREE TIMES A DAY AS NEEDED FOR SPASMS 270 capsule 0  ? glucosamine-chondroitin 500-400 MG tablet Take 1 tablet by mouth 2 (two) times daily.    ? Kava, Piper methysticum, (KAVA KAVA) 200 MG CAPS See admin instructions.    ? Magnesium 300 MG CAPS 1  capsule with a meal    ? methocarbamol (ROBAXIN) 750 MG tablet Take 1 tablet by mouth every 12 (twelve) hours.    ? Multiple Vitamins-Minerals (SENTRY) TABS See admin instructions.    ? omeprazole (PRILOSEC) 20 MG capsule TAKE 1 CAPSULE BY MOUTH EVERY DAY 90 capsule 3  ? oxyCODONE-acetaminophen (PERCOCET) 7.5-325 MG tablet SMARTSIG:1 Tablet(s) By Mouth    ? pravastatin (PRAVACHOL) 40 MG tablet TAKE 1 TABLET BY MOUTH EVERY DAY 90 tablet 3  ? Probiotic Product (PROBIOTIC-10  PO) See admin instructions.    ? pseudoephedrine (SUDAFED) 60 MG tablet 1 tablet as needed    ? sertraline (ZOLOFT) 100 MG tablet TAKE 1 TABLET BY MOUTH EVERY DAY 90 tablet 1  ? traMADol (ULTRAM-ER) 200 MG 24 hr tablet Take 200 mg by mouth daily.    ? traZODone (DESYREL) 100 MG tablet TAKE 1 TABLET BY MOUTH EVERYDAY AT BEDTIME 90 tablet 0  ? TURMERIC PO Take by mouth daily.    ? ?No current facility-administered medications for this visit.  ? ? ?Medication Side Effects: None ? ?Allergies:  ?Allergies  ?Allergen Reactions  ? Cymbalta [Duloxetine Hcl] Diarrhea  ? Morphine Hives  ? ? ?Past Medical History:  ?Diagnosis Date  ? Avulsion fracture of right talus 06/27/2017  ? Chronic LBP   ? Lumbar DDD  ? Colitis, ischemic (HCC)   ? Depression   ? Endometriosis   ? GERD (gastroesophageal reflux disease)   ? Hematuria   ? Hyperlipidemia   ? IBS (irritable bowel syndrome)   ? Fr Carolyn Hendrix- on Miralax and Citrucel daily  ? Migraines   ? MVP (mitral valve prolapse)   ? ? ?Family History  ?Problem Relation Age of Onset  ? Alzheimer's disease Mother   ? Other Father   ?     ACS? - 80's  ? Ataxia Sister   ? ? ?Social History  ? ?Socioeconomic History  ? Marital status: Widowed  ?  Spouse name: Carolyn Hendrix  ? Number of children: 2  ? Years of education: 3812  ? Highest education level: 12th grade  ?Occupational History  ? Occupation: retired  ?  Comment: Print production planneroffice manager [place]  ?Tobacco Use  ? Smoking status: Former  ?  Types: Cigarettes  ?  Quit date: 10/23/1980  ?  Years since quitting: 41.2  ? Smokeless tobacco: Never  ?Vaping Use  ? Vaping Use: Never used  ?Substance and Sexual Activity  ? Alcohol use: No  ? Drug use: No  ? Sexual activity: Not Currently  ?Other Topics Concern  ? Not on file  ?Social History Narrative  ? Her husband passed away in December 2021. She is getting better at taking care of herself as she had to take care of her husband for the past 2.5 years. She lives alone with her dog.   ? ?Social  Determinants of Health  ? ?Financial Resource Strain: Not on file  ?Food Insecurity: Not on file  ?Transportation Needs: Not on file  ?Physical Activity: Not on file  ?Stress: Not on file  ?Social Connections: Not on file  ?Intimate Partner Violence: Not on file  ? ? ?Past Medical History, Surgical history, Social history, and Family history were reviewed and updated as appropriate.  ? ?Please see review of systems for further details on the patient's review from today.  ? ?Objective:  ? ?Physical Exam:  ?There were no vitals taken for this visit. ? ?Physical Exam ?Constitutional:   ?  General: She is not in acute distress. ?Musculoskeletal:     ?   General: No deformity.  ?Neurological:  ?   Mental Status: She is alert and oriented to person, place, and time.  ?   Coordination: Coordination normal.  ?Psychiatric:     ?   Attention and Perception: Attention and perception normal. She does not perceive auditory or visual hallucinations.     ?   Mood and Affect: Mood normal. Mood is not anxious or depressed. Affect is not labile, blunt, angry or inappropriate.     ?   Speech: Speech normal.     ?   Behavior: Behavior normal.     ?   Thought Content: Thought content normal. Thought content is not paranoid or delusional. Thought content does not include homicidal or suicidal ideation. Thought content does not include homicidal or suicidal plan.     ?   Cognition and Memory: Cognition and memory normal.     ?   Judgment: Judgment normal.  ?   Comments: Insight intact  ? ? ?Lab Review:  ?   ?Component Value Date/Time  ? NA 143 12/24/2020 0000  ? K 4.5 12/24/2020 0000  ? CL 105 12/24/2020 0000  ? CO2 28 12/24/2020 0000  ? GLUCOSE 93 12/24/2020 0000  ? BUN 27 (H) 12/24/2020 0000  ? CREATININE 1.11 (H) 12/24/2020 0000  ? CALCIUM 9.2 12/24/2020 0000  ? PROT 6.4 12/24/2020 0000  ? ALBUMIN 4.3 11/10/2016 0935  ? AST 16 12/24/2020 0000  ? ALT 17 12/24/2020 0000  ? ALKPHOS 80 11/10/2016 0935  ? BILITOT 0.3 12/24/2020 0000  ?  GFRNONAA 48 (L) 12/24/2020 0000  ? GFRAA 55 (L) 12/24/2020 0000  ? ? ?   ?Component Value Date/Time  ? WBC 8.7 12/24/2020 0000  ? RBC 4.37 12/24/2020 0000  ? HGB 13.5 12/24/2020 0000  ? HCT 40.7 12/24/2020 0000  ?

## 2022-01-04 ENCOUNTER — Other Ambulatory Visit: Payer: Self-pay | Admitting: Family Medicine

## 2022-01-24 ENCOUNTER — Encounter: Payer: Medicare Other | Admitting: Family Medicine

## 2022-01-26 DIAGNOSIS — M961 Postlaminectomy syndrome, not elsewhere classified: Secondary | ICD-10-CM | POA: Diagnosis not present

## 2022-01-26 DIAGNOSIS — M5136 Other intervertebral disc degeneration, lumbar region: Secondary | ICD-10-CM | POA: Diagnosis not present

## 2022-01-26 DIAGNOSIS — M47816 Spondylosis without myelopathy or radiculopathy, lumbar region: Secondary | ICD-10-CM | POA: Diagnosis not present

## 2022-01-26 DIAGNOSIS — M5459 Other low back pain: Secondary | ICD-10-CM | POA: Diagnosis not present

## 2022-01-26 DIAGNOSIS — M48061 Spinal stenosis, lumbar region without neurogenic claudication: Secondary | ICD-10-CM | POA: Diagnosis not present

## 2022-01-26 DIAGNOSIS — M7918 Myalgia, other site: Secondary | ICD-10-CM | POA: Diagnosis not present

## 2022-01-26 DIAGNOSIS — M461 Sacroiliitis, not elsewhere classified: Secondary | ICD-10-CM | POA: Diagnosis not present

## 2022-01-26 DIAGNOSIS — G8929 Other chronic pain: Secondary | ICD-10-CM | POA: Diagnosis not present

## 2022-01-26 DIAGNOSIS — Z79891 Long term (current) use of opiate analgesic: Secondary | ICD-10-CM | POA: Diagnosis not present

## 2022-01-27 ENCOUNTER — Encounter: Payer: Self-pay | Admitting: Family Medicine

## 2022-01-27 DIAGNOSIS — F339 Major depressive disorder, recurrent, unspecified: Secondary | ICD-10-CM

## 2022-01-30 ENCOUNTER — Other Ambulatory Visit: Payer: Self-pay | Admitting: Family Medicine

## 2022-01-30 DIAGNOSIS — F339 Major depressive disorder, recurrent, unspecified: Secondary | ICD-10-CM

## 2022-01-30 MED ORDER — BUPROPION HCL ER (XL) 150 MG PO TB24: 150.0000 mg | ORAL_TABLET | Freq: Every day | ORAL | 1 refills | Status: DC

## 2022-01-30 NOTE — Telephone Encounter (Signed)
Ok, sent a prescription for the lower dose Wellbutrin to try for 30 days.  If that is not helpful then it may be worth decreasing her trazodone instead. ?

## 2022-01-31 ENCOUNTER — Encounter: Payer: Medicare Other | Admitting: Family Medicine

## 2022-02-01 ENCOUNTER — Other Ambulatory Visit: Payer: Self-pay | Admitting: Adult Health

## 2022-02-01 DIAGNOSIS — F411 Generalized anxiety disorder: Secondary | ICD-10-CM

## 2022-02-03 ENCOUNTER — Other Ambulatory Visit: Payer: Self-pay

## 2022-02-03 MED ORDER — ALPRAZOLAM 0.5 MG PO TABS: 0.2500 mg | ORAL_TABLET | Freq: Two times a day (BID) | ORAL | 0 refills | Status: DC | PRN

## 2022-02-07 ENCOUNTER — Encounter: Payer: Medicare Other | Admitting: Family Medicine

## 2022-02-15 ENCOUNTER — Encounter: Payer: Self-pay | Admitting: Family Medicine

## 2022-02-15 DIAGNOSIS — F4310 Post-traumatic stress disorder, unspecified: Secondary | ICD-10-CM

## 2022-02-16 NOTE — Telephone Encounter (Signed)
Orders Placed This Encounter  Procedures   Ambulatory referral to Behavioral Health    Referral Priority:   Routine    Referral Type:   Psychiatric    Referral Reason:   Specialty Services Required    Requested Specialty:   Behavioral Health    Number of Visits Requested:   1    

## 2022-02-23 ENCOUNTER — Other Ambulatory Visit: Payer: Self-pay | Admitting: Family Medicine

## 2022-02-23 ENCOUNTER — Encounter: Payer: Medicare Other | Admitting: Family Medicine

## 2022-02-27 ENCOUNTER — Ambulatory Visit (INDEPENDENT_AMBULATORY_CARE_PROVIDER_SITE_OTHER): Payer: Medicare Other | Admitting: Family Medicine

## 2022-02-27 DIAGNOSIS — Z0000 Encounter for general adult medical examination without abnormal findings: Secondary | ICD-10-CM

## 2022-02-27 NOTE — Patient Instructions (Addendum)
?MEDICARE ANNUAL WELLNESS VISIT ?Health Maintenance Summary and Written Plan of Care ? ?[name] , ? ?Thank you for allowing me to perform your Medicare Annual Wellness Visit and for your ongoing commitment to your health.  ? ?Health Maintenance & Immunization History ?Health Maintenance  ?Topic Date Due  ? COVID-19 Vaccine (4 - Booster for Pfizer series) 03/15/2022 (Originally 10/19/2020)  ? Hepatitis C Screening  02/28/2023 (Originally 07/16/1959)  ? INFLUENZA VACCINE  05/23/2022  ? TETANUS/TDAP  12/18/2026  ? Pneumonia Vaccine 7565+ Years old  Completed  ? DEXA SCAN  Completed  ? Zoster Vaccines- Shingrix  Completed  ? HPV VACCINES  Aged Out  ? COLONOSCOPY (Pts [id number] Insurance coverage will need to be confirmed)  Discontinued  ? ?Immunization History  ?Administered Date(s) Administered  ? Fluad Quad(high Dose 65+) 06/13/2019, 08/10/2020, 07/26/2021  ? Influenza Split 08/30/2011, 08/07/2012  ? Influenza Whole 08/19/2008, 08/03/2010  ? Influenza, Seasonal, Injecte, Preservative Fre 07/22/2013  ? Influenza,inj,Quad PF,6+ Mos 09/23/2014, 06/25/2015, 09/08/2016  ? Influenza-Unspecified 06/27/2017, 08/11/2018  ? PFIZER(Purple Top)SARS-COV-2 Vaccination 01/01/2020, 01/22/2020, 08/24/2020  ? Pneumococcal Conjugate-13 09/23/2014  ? Pneumococcal Polysaccharide-23 06/02/2009  ? Td 05/16/2006  ? Tdap 12/18/2016  ? Zoster Recombinat (Shingrix) 11/22/2018, 05/14/2019  ? Zoster, Live 05/30/2011  ? ? ?These are the patient goals that we discussed: ? Goals Addressed   ? ?  ?  ?  ?  ?  ? This Visit's Progress  ?   Patient Stated (pt-stated)     ?   Would like to work on her mental health and physical health.  ?  ? ?  ?  ? ?This is a list of Health Maintenance Items that are overdue or due now: ?Hep C screening ?  ? ?Orders/Referrals Placed Today: ?No orders of the defined types were placed in this encounter. ? ?(Contact our referral department at [phone] if you have not spoken with someone about your referral appointment  within the next 5 days)  ? ? ?Follow-up Plan ?Follow-up with Agapito GamesMetheney, Catherine D, MD as planned ?Medicare wellness visit in one year.  ?Patient will access AVS on my chart. ? ?  ?Health Maintenance, Female ?Adopting a healthy lifestyle and getting preventive care are important in promoting health and wellness. Ask your health care provider about: ?The right schedule for you to have regular tests and exams. ?Things you can do on your own to prevent diseases and keep yourself healthy. ?What should I know about diet, weight, and exercise? ?Eat a healthy diet ? ?Eat a diet that includes plenty of vegetables, fruits, low-fat dairy products, and lean protein. ?Do not eat a lot of foods that are high in solid fats, added sugars, or sodium. ?Maintain a healthy weight ?Body mass index (BMI) is used to identify weight problems. It estimates body fat based on height and weight. Your health care provider can help determine your BMI and help you achieve or maintain a healthy weight. ?Get regular exercise ?Get regular exercise. This is one of the most important things you can do for your health. Most adults should: ?Exercise for at least 150 minutes each week. The exercise should increase your heart rate and make you sweat (moderate-intensity exercise). ?Do strengthening exercises at least twice a week. This is in addition to the moderate-intensity exercise. ?Spend less time sitting. Even light physical activity can be beneficial. ?Watch cholesterol and blood lipids ?Have your blood tested for lipids and cholesterol at 79 years of age, then have this test every 5 years. ?Have  your cholesterol levels checked more often if: ?Your lipid or cholesterol levels are high. ?You are older than 79 years of age. ?You are at high risk for heart disease. ?What should I know about cancer screening? ?Depending on your health history and family history, you may need to have cancer screening at various ages. This may include screening  for: ?Breast cancer. ?Cervical cancer. ?Colorectal cancer. ?Skin cancer. ?Lung cancer. ?What should I know about heart disease, diabetes, and high blood pressure? ?Blood pressure and heart disease ?High blood pressure causes heart disease and increases the risk of stroke. This is more likely to develop in people who have high blood pressure readings or are overweight. ?Have your blood pressure checked: ?Every 3-5 years if you are 38-28 years of age. ?Every year if you are 58 years old or older. ?Diabetes ?Have regular diabetes screenings. This checks your fasting blood sugar level. Have the screening done: ?Once every three years after age 43 if you are at a normal weight and have a low risk for diabetes. ?More often and at a younger age if you are overweight or have a high risk for diabetes. ?What should I know about preventing infection? ?Hepatitis B ?If you have a higher risk for hepatitis B, you should be screened for this virus. Talk with your health care provider to find out if you are at risk for hepatitis B infection. ?Hepatitis C ?Testing is recommended for: ?Everyone born from [birth year] through 1963. ?Anyone with known risk factors for hepatitis C. ?Sexually transmitted infections (STIs) ?Get screened for STIs, including gonorrhea and chlamydia, if: ?You are sexually active and are younger than 79 years of age. ?You are older than 79 years of age and your health care provider tells you that you are at risk for this type of infection. ?Your sexual activity has changed since you were last screened, and you are at increased risk for chlamydia or gonorrhea. Ask your health care provider if you are at risk. ?Ask your health care provider about whether you are at high risk for HIV. Your health care provider may recommend a prescription medicine to help prevent HIV infection. If you choose to take medicine to prevent HIV, you should first get tested for HIV. You should then be tested every 3 months for as long as you  are taking the medicine. ?Pregnancy ?If you are about to stop having your period (premenopausal) and you may become pregnant, seek counseling before you get pregnant. ?Take 400 to 800 micrograms (mcg) of folic acid every day if you become pregnant. ?Ask for birth control (contraception) if you want to prevent pregnancy. ?Osteoporosis and menopause ?Osteoporosis is a disease in which the bones lose minerals and strength with aging. This can result in bone fractures. If you are 21 years old or older, or if you are at risk for osteoporosis and fractures, ask your health care provider if you should: ?Be screened for bone loss. ?Take a calcium or vitamin D supplement to lower your risk of fractures. ?Be given hormone replacement therapy (HRT) to treat symptoms of menopause. ?Follow these instructions at home: ?Alcohol use ?Do not drink alcohol if: ?Your health care provider tells you not to drink. ?You are pregnant, may be pregnant, or are planning to become pregnant. ?If you drink alcohol: ?Limit how much you have to: ?0-1 drink a day. ?Know how much alcohol is in your drink. In the [place] one drink equals one 12 oz bottle of beer (355 mL), one 5 oz  glass of wine (148 mL), or one 1? oz glass of hard liquor (44 mL). ?Lifestyle ?Do not use any products that contain nicotine or tobacco. These products include cigarettes, chewing tobacco, and vaping devices, such as e-cigarettes. If you need help quitting, ask your health care provider. ?Do not use street drugs. ?Do not share needles. ?Ask your health care provider for help if you need support or information about quitting drugs. ?General instructions ?Schedule regular health, dental, and eye exams. ?Stay current with your vaccines. ?Tell your health care provider if: ?You often feel depressed. ?You have ever been abused or do not feel safe at home. ?Summary ?Adopting a healthy lifestyle and getting preventive care are important in promoting health and wellness. ?Follow your  health care provider's instructions about healthy diet, exercising, and getting tested or screened for diseases. ?Follow your health care provider's instructions on monitoring your cholesterol and blood pressu

## 2022-02-27 NOTE — Progress Notes (Signed)
? ? ?[facility] VISIT ? ?02/27/2022 ? ?Telephone Visit Disclaimer ?This Medicare AWV was conducted by telephone due to national recommendations for restrictions regarding the COVID-19 Pandemic (e.g. social distancing).  I verified, using two identifiers, that I am speaking with Carolyn Hendrix or their authorized healthcare agent. I discussed the limitations, risks, security, and privacy concerns of performing an evaluation and management service by telephone and the potential availability of an in-person appointment in the future. The patient expressed understanding and agreed to proceed.  ?Location of Patient: Home  ?Location of Provider (nurse):  In the office. ? ?Subjective:  ? ? ?Carolyn Hendrix is a 81 y.o. female patient of Metheney, Carolyn Kocher, MD who had a Medicare Annual Wellness Visit today via telephone. Carolyn Hendrix is Retired and lives alone. she has 2 children. she reports that she is socially active and does interact with friends/family regularly. she is minimally physically active and enjoys taking care of her dog and working on puzzles. ? ?Patient Care Team: ?Carolyn Marry, MD as PCP - General (Family Medicine) ?Carolyn Natal, MD as Referring Physician (Obstetrics and Gynecology) ?Carolyn Gustin, MD as Consulting Physician (Anesthesiology) ?Carolyn Ammons, MD as Referring Physician (Dermatology) ? ? ?  02/27/2022  ? 10:57 AM 11/29/2020  ? 11:16 AM 11/25/2019  ? 11:09 AM 11/19/2018  ? 11:17 AM 05/23/2017  ?  3:21 PM 09/11/2016  ?  1:54 PM 09/23/2014  ? 12:08 PM  ?Advanced Directives  ?Does Patient Have a Medical Advance Directive? Yes Yes Yes Yes Yes Yes Yes  ?Type of Advance Directive Living will [place];Living will [place];Living will [place];Living will [place];Living will [place];Living will Living will  ?Does patient want to make changes to medical advance directive? No -  Patient declined No - Patient declined No - Patient declined No - Patient declined  No - Patient declined   ?Copy of [place] in Chart?  No - copy requested No - copy requested No - copy requested Yes Yes No - copy requested  ? ? ?Hospital Utilization Over the Past 12 Months: ?# of hospitalizations or ER visits: 2 ?# of surgeries: 1 - cataract ? ?Review of Systems    ?Patient reports that her overall health is worse compared to last year. ? ?History obtained from chart review and the patient ? ?Patient Reported Readings (BP, Pulse, CBG, Weight, etc) ?none ? ?Pain Assessment ?Pain : 0-10 ?Pain Score: 7  ?Pain Type: Chronic pain ?Pain Location: Generalized ?Pain Descriptors / Indicators: Constant ?Pain Onset: More than a month ago ?Pain Frequency: Constant ?Pain Relieving Factors: rest and medication ? ?Pain Relieving Factors: rest and medication ? ?Current Medications & Allergies (verified) ?Allergies as of 02/27/2022   ? ?   Reactions  ? Cymbalta [duloxetine Hcl] Diarrhea  ? Morphine Hives  ? ?  ? ?  ?Medication List  ?  ? ?  ? Accurate as of Feb 27, 2022 11:13 AM. If you have any questions, ask your nurse or doctor.  ?  ?  ? ?  ? ?acyclovir ointment 5 % ?Commonly known as: ZOVIRAX ?Apply 1 application topically every 3 (three) hours. ?  ?alendronate 70 MG tablet ?Commonly known as: FOSAMAX ?Take 1 tablet (70 mg total) by mouth every 7 (seven) days. Take with a full glass of water on an empty stomach. ?  ?ALPRAZolam 0.25 MG tablet ?Commonly known as: Duanne Moron ?TAKE 1 TABLET  BY MOUTH TWICE A DAY AS NEEDED FOR ANXIETY ?  ?ALPRAZolam 0.5 MG tablet ?Commonly known as: Duanne Moron ?Take 0.5 tablets (0.25 mg total) by mouth 2 (two) times daily as needed for anxiety. ?  ?buPROPion 150 MG 24 hr tablet ?Commonly known as: WELLBUTRIN XL ?Take 1 tablet (150 mg total) by mouth daily. ?  ?dicyclomine 10 MG capsule ?Commonly known as: BENTYL ?TAKE 1 CAPSULE BY MOUTH THREE TIMES A DAY AS NEEDED FOR SPASMS ?   ?glucosamine-chondroitin 500-400 MG tablet ?Take 1 tablet by mouth 2 (two) times daily. ?  ?Kava Kava 200 MG Caps ?See admin instructions. ?  ?Magnesium 300 MG Caps ?1 capsule with a meal ?  ?methocarbamol 750 MG tablet ?Commonly known as: ROBAXIN ?Take 1 tablet by mouth every 12 (twelve) hours. ?  ?omeprazole 20 MG capsule ?Commonly known as: PRILOSEC ?TAKE 1 CAPSULE BY MOUTH EVERY DAY ?  ?oxyCODONE-acetaminophen 10-325 MG tablet ?Commonly known as: PERCOCET ?Take 1 tablet by mouth every 8 (eight) hours as needed. ?What changed: Another medication with the same name was removed. Continue taking this medication, and follow the directions you see here. ?  ?pravastatin 40 MG tablet ?Commonly known as: PRAVACHOL ?TAKE 1 TABLET BY MOUTH EVERY DAY ?  ?PROBIOTIC-10 PO ?See admin instructions. ?  ?pseudoephedrine 60 MG tablet ?Commonly known as: SUDAFED ?1 tablet as needed ?  ?Sentry Tabs ?See admin instructions. ?  ?sertraline 100 MG tablet ?Commonly known as: ZOLOFT ?TAKE 1 TABLET BY MOUTH EVERY DAY ?  ?traMADol 200 MG 24 hr tablet ?Commonly known as: ULTRAM-ER ?Take 200 mg by mouth daily. ?  ?traZODone 100 MG tablet ?Commonly known as: DESYREL ?TAKE 1 TABLET BY MOUTH EVERYDAY AT BEDTIME ?  ?TURMERIC PO ?Take by mouth daily. ?  ?Vitamin D3 25 MCG (1000 UT) Caps ?1 capsule ?  ? ?  ? ? ?History (reviewed): ?Past Medical History:  ?Diagnosis Date  ? Allergy   ? Anxiety June 2021  ? Still ongoing  ? Arthritis   ? Avulsion fracture of right talus 06/27/2017  ? Cataract   ? Chronic LBP   ? Lumbar DDD  ? Colitis, ischemic ([place])   ? Depression   ? Endometriosis   ? GERD (gastroesophageal reflux disease)   ? Hematuria   ? Hyperlipidemia   ? IBS (irritable bowel syndrome)   ? Fr Carolyn Hendrix- on Miralax and Citrucel daily  ? Migraines   ? MVP (mitral valve prolapse)   ? ?Past Surgical History:  ?Procedure Laterality Date  ? ABDOMINAL HYSTERECTOMY  1991  ? w/ bilat oophorectomy for endometriosis  ? APPENDECTOMY    ? BREAST BIOPSY     ? ESOPHAGOGASTRODUODENOSCOPY  2006  ? EYE SURGERY  01-2021  ? Cataracts  ? fused 5th digit right hand Right   ? HEMILAMINOTOMY LUMBAR SPINE  11/18/1991  ? Dr. Billey Chang  ? lapartomy  1995  ? [place]  ? right breast biopsy- benign    ? SPINAL FUSION  08/18/1997  ? Dr. Rowe Pavy at [facility], L4-5 post fusion with iliac crest bone graft  ? TONSILLECTOMY AND ADENOIDECTOMY    ? ?Family History  ?Problem Relation Age of Onset  ? Alzheimer's disease Mother   ? Other Father   ?     ACS? - 65's  ? Depression Father   ? Ataxia Sister   ? ?Social History  ? ?Socioeconomic History  ? Marital status: Widowed  ?  Spouse name: Gwyndolyn Saxon  ?  Number of children: 2  ? Years of education: 32  ? Highest education level: 12th grade  ?Occupational History  ? Occupation: retired  ?  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 Smoking status: Former  ?  Packs/day: 1.50  ?  Years: 10.00  ?  Pack years: 15.00  ?  Types: Cigarettes  ?  Quit date: 10/23/1980  ?  Years since quitting: 41.3  ? Smokeless tobacco: Never  ? Tobacco comments:  ?  Quit 1982  ?Vaping Use  ? Vaping Use: Never used  ?Substance and Sexual Activity  ? Alcohol use: No  ? Drug use: No  ? Sexual activity: Not Currently  ?  Birth control/protection: Abstinence, Surgical, None  ?Other Topics Concern  ? Not on file  ?Social History Narrative  ? Her husband passed away in 2020-10-02. She is getting better at taking care of herself as she had to take care of her husband for the past 2.5 years. She lives alone with her dog.   ? ?Social Determinants of Health  ? ?Financial Resource Strain: Low Risk   ? Difficulty of Paying Living Expenses: Not hard at all  ?Food Insecurity: No Food Insecurity  ? Worried About Charity fundraiser in the Last Year: Never true  ? Ran Out of Food in the Last Year: Never true  ?Transportation Needs: No Transportation Needs  ? Lack of Transportation (Medical): No  ? Lack of Transportation (Non-Medical): No   ?Physical Activity: Inactive  ? Days of Exercise per Week: 0 days  ? Minutes of Exercise per Session: 0 min  ?Stress: No Stress Concern Present  ? Feeling of Stress : Only a little  ?Social Connections: Moderately Integ

## 2022-03-04 ENCOUNTER — Other Ambulatory Visit: Payer: Self-pay | Admitting: Adult Health

## 2022-03-06 DIAGNOSIS — M47816 Spondylosis without myelopathy or radiculopathy, lumbar region: Secondary | ICD-10-CM | POA: Diagnosis not present

## 2022-03-06 DIAGNOSIS — M461 Sacroiliitis, not elsewhere classified: Secondary | ICD-10-CM | POA: Diagnosis not present

## 2022-03-07 NOTE — Telephone Encounter (Signed)
Pt would like a refill of the Alprazolam .25 mg sent to  ? ?[phone] [place] [place] - [address]  ?[address] [place], [place] [address]  ?Phone:  [phone]  Fax:  [phone] ? ?No upcoming appt scheduled. ?

## 2022-03-08 NOTE — Telephone Encounter (Signed)
Pt called checking status on RF Alprazolam [facility]. Rene Kocher has been sending 0.50 mg to take 1/2 of the 0.50 mg tablet. [facility] Pt out of med. [facility] [address] Kathryne Sharper ?

## 2022-03-08 NOTE — Telephone Encounter (Signed)
Please send for [facility]. ? ? ?[facility]inder [facility]am I have another pt with same issue. ?

## 2022-03-09 NOTE — Telephone Encounter (Signed)
Noted. Ty!

## 2022-03-14 ENCOUNTER — Other Ambulatory Visit: Payer: Self-pay | Admitting: Family Medicine

## 2022-03-14 DIAGNOSIS — F5101 Primary insomnia: Secondary | ICD-10-CM

## 2022-03-23 ENCOUNTER — Ambulatory Visit (INDEPENDENT_AMBULATORY_CARE_PROVIDER_SITE_OTHER): Payer: Medicare Other | Admitting: Family Medicine

## 2022-03-23 ENCOUNTER — Encounter: Payer: Self-pay | Admitting: Family Medicine

## 2022-03-23 VITALS — BP 114/59 | HR 86 | Resp 16 | Ht 66.0 in | Wt 153.0 lb

## 2022-03-23 DIAGNOSIS — R413 Other amnesia: Secondary | ICD-10-CM | POA: Diagnosis not present

## 2022-03-23 DIAGNOSIS — M8580 Other specified disorders of bone density and structure, unspecified site: Secondary | ICD-10-CM

## 2022-03-23 DIAGNOSIS — Z0000 Encounter for general adult medical examination without abnormal findings: Secondary | ICD-10-CM

## 2022-03-23 DIAGNOSIS — F339 Major depressive disorder, recurrent, unspecified: Secondary | ICD-10-CM

## 2022-03-23 DIAGNOSIS — E611 Iron deficiency: Secondary | ICD-10-CM

## 2022-03-23 DIAGNOSIS — E7800 Pure hypercholesterolemia, unspecified: Secondary | ICD-10-CM | POA: Diagnosis not present

## 2022-03-23 MED ORDER — BUPROPION HCL ER (XL) 300 MG PO TB24: 300.0000 mg | ORAL_TABLET | Freq: Every day | ORAL | 1 refills | Status: DC

## 2022-03-23 NOTE — Progress Notes (Unsigned)
   Established Patient Office Visit  Subjective   Patient ID: Carolyn Hendrix, female    DOB: [birth year]  Age: 81 y.o. MRN: [id number]  Chief Complaint  Patient presents with   Annual Exam    Not Fasting     HPI  {History (Optional):23778}  ROS    Objective:     BP (!) 114/59   Pulse 86   Resp 16   Ht 5\' 6"  (1.676 m)   Wt 153 lb (69.4 kg)   SpO2 95%   BMI 24.69 kg/m  {Vitals History (Optional):23777}  Physical Exam   No results found for any visits on 03/23/22.  {Labs (Optional):23779}  The 10-year ASCVD risk score (Arnett DK, et al., 2019) is: 17.5%    Assessment & Plan:   Problem List Items Addressed This Visit   None   No follow-ups on file.    Beatrice Lecher, MD

## 2022-03-24 LAB — COMPLETE METABOLIC PANEL WITH GFR
AG Ratio: 1.6 (calc) (ref 1.0–2.5)
ALT: 14 U/L (ref 6–29)
AST: 20 U/L (ref 10–35)
Albumin: 4.2 g/dL (ref 3.6–5.1)
Alkaline phosphatase (APISO): 87 U/L (ref 37–153)
BUN: 15 mg/dL (ref 7–25)
CO2: 31 mmol/L (ref 20–32)
Calcium: 8.5 mg/dL — ABNORMAL LOW (ref 8.6–10.4)
Chloride: 103 mmol/L (ref 98–110)
Creat: 0.95 mg/dL (ref 0.60–1.00)
Globulin: 2.6 g/dL (calc) (ref 1.9–3.7)
Glucose, Bld: 87 mg/dL (ref 65–99)
Potassium: 4.5 mmol/L (ref 3.5–5.3)
Sodium: 142 mmol/L (ref 135–146)
Total Bilirubin: 0.4 mg/dL (ref 0.2–1.2)
Total Protein: 6.8 g/dL (ref 6.1–8.1)
eGFR: 61 mL/min/{1.73_m2} (ref >=60)

## 2022-03-24 LAB — CBC
HCT: 38.7 % (ref 35.0–45.0)
Hemoglobin: 12.9 g/dL (ref 11.7–15.5)
MCH: 32.1 pg (ref 27.0–33.0)
MCHC: 33.3 g/dL (ref 32.0–36.0)
MCV: 96.3 fL (ref 80.0–100.0)
MPV: 9.9 fL (ref 7.5–12.5)
Platelets: 324 10*3/uL (ref 140–400)
RBC: 4.02 10*6/uL (ref 3.80–5.10)
RDW: 12.1 % (ref 11.0–15.0)
WBC: 7.3 10*3/uL (ref 3.8–10.8)

## 2022-03-24 LAB — LIPID PANEL W/REFLEX DIRECT LDL
Cholesterol: 211 mg/dL — ABNORMAL HIGH (ref <200)
HDL: 37 mg/dL — ABNORMAL LOW (ref >=50)
LDL Cholesterol (Calc): 129 mg/dL (calc) — ABNORMAL HIGH
Non-HDL Cholesterol (Calc): 174 mg/dL (calc) — ABNORMAL HIGH (ref <130)
Total CHOL/HDL Ratio: 5.7 (calc) — ABNORMAL HIGH (ref <5.0)
Triglycerides: 312 mg/dL — ABNORMAL HIGH (ref <150)

## 2022-03-24 NOTE — Progress Notes (Signed)
Complete physical exam  Patient: Carolyn Hendrix   DOB: 1941-08-05   81 y.o. Female  MRN: [id number]  Subjective:    Chief Complaint  Patient presents with   Annual Exam    Not Fasting     [name] is a 81 y.o. female who presents today for a complete physical exam. She reports consuming a general diet. The patient does not participate in regular exercise at present. She generally feels fairly well. She reports sleeping fair. She does have additional problems to discuss today.   She has upcoming appt with new psychiatrist she is still really struggling with her anxiety and mood.  She feels she gets overwhelmed at times.  Her grandaughter is here with her today.  But she would like to go back up on her Wellbutrin.  Most recent fall risk assessment:    02/23/2022    9:02 AM  Fall Risk   Falls in the past year? 1  Number falls in past yr: 0  Injury with Fall? 1     Most recent depression screenings:    02/27/2022   11:07 AM 12/20/2021    9:55 AM  PHQ 2/9 Scores  PHQ - 2 Score 5 4  PHQ- 9 Score 12 9      Past Medical History:  Diagnosis Date   Allergy    Anxiety June 2021   Still ongoing   Arthritis    Avulsion fracture of right talus 06/27/2017   Cataract    Chronic LBP    Lumbar DDD   Colitis, ischemic (HCC)    Depression    Endometriosis    GERD (gastroesophageal reflux disease)    Hematuria    Hyperlipidemia    IBS (irritable bowel syndrome)    Fr Hurrelbrink- on Miralax and Citrucel daily   Migraines    MVP (mitral valve prolapse)    Past Surgical History:  Procedure Laterality Date   ABDOMINAL HYSTERECTOMY  1991   w/ bilat oophorectomy for endometriosis   APPENDECTOMY     BREAST BIOPSY     ESOPHAGOGASTRODUODENOSCOPY  2006   EYE SURGERY  01-2021   Cataracts   fused 5th digit right hand Right    HEMILAMINOTOMY LUMBAR SPINE  11/18/1991   Dr. Billey Chang   lapartomy  1995   OTHER SURGICAL HISTORY  1992, 1994   right breast biopsy- benign      SPINAL FUSION  08/18/1997   Dr. Rowe Pavy at [facility], L4-5 post fusion with iliac crest bone graft   TONSILLECTOMY AND ADENOIDECTOMY     Social History   Tobacco Use   Smoking status: Former    Packs/day: 1.50    Years: 10.00    Pack years: 15.00    Types: Cigarettes    Quit date: 10/23/1980    Years since quitting: 41.4   Smokeless tobacco: Never   Tobacco comments:    Quit 1982  Vaping Use   Vaping Use: Never used  Substance Use Topics   Alcohol use: No   Drug use: No      Patient Care Team: Hali Marry, MD as PCP - General (Family Medicine) Linward Natal, MD as Referring Physician (Obstetrics and Gynecology) Cleon Gustin, MD as Consulting Physician (Anesthesiology) Vernie Ammons, MD as Referring Physician (Dermatology)   Outpatient Medications Prior to Visit  Medication Sig   acyclovir ointment (ZOVIRAX) 5 % Apply 1 application topically every 3 (three) hours.   alendronate (FOSAMAX) 70 MG tablet Take 1  tablet (70 mg total) by mouth every 7 (seven) days. Take with a full glass of water on an empty stomach.   ALPRAZolam (XANAX) 0.25 MG tablet TAKE 1 TABLET BY MOUTH TWICE A DAY AS NEEDED FOR ANXIETY   ALPRAZolam (XANAX) 0.5 MG tablet TAKE 0.5 TABLETS BY MOUTH 2 TIMES DAILY AS NEEDED FOR ANXIETY.   Cholecalciferol (VITAMIN D3) 25 MCG (1000 UT) CAPS 1 capsule   dicyclomine (BENTYL) 10 MG capsule TAKE 1 CAPSULE BY MOUTH THREE TIMES A DAY AS NEEDED FOR SPASMS   glucosamine-chondroitin 500-400 MG tablet Take 1 tablet by mouth 2 (two) times daily.   Kava, Piper methysticum, (KAVA KAVA) 200 MG CAPS See admin instructions.   Magnesium 300 MG CAPS 1 capsule with a meal   methocarbamol (ROBAXIN) 750 MG tablet Take 1 tablet by mouth every 12 (twelve) hours.   Multiple Vitamins-Minerals (SENTRY) TABS See admin instructions.   omeprazole (PRILOSEC) 20 MG capsule TAKE 1 CAPSULE BY MOUTH EVERY DAY   oxyCODONE-acetaminophen (PERCOCET) 10-325 MG tablet Take 1 tablet  by mouth every 8 (eight) hours as needed.   pravastatin (PRAVACHOL) 40 MG tablet TAKE 1 TABLET BY MOUTH EVERY DAY   Probiotic Product (PROBIOTIC-10 PO) See admin instructions.   pseudoephedrine (SUDAFED) 60 MG tablet 1 tablet as needed   sertraline (ZOLOFT) 100 MG tablet TAKE 1 TABLET BY MOUTH EVERY DAY   traMADol (ULTRAM-ER) 200 MG 24 hr tablet Take 200 mg by mouth daily.   traZODone (DESYREL) 100 MG tablet TAKE 1 TABLET BY MOUTH EVERYDAY AT BEDTIME   TURMERIC PO Take by mouth daily.   [DISCONTINUED] buPROPion (WELLBUTRIN XL) 150 MG 24 hr tablet Take 1 tablet (150 mg total) by mouth daily.   No facility-administered medications prior to visit.    ROS        Objective:     BP (!) 114/59   Pulse 86   Resp 16   Ht '5\' 6"'  (1.676 m)   Wt 153 lb (69.4 kg)   SpO2 95%   BMI 24.69 kg/m     Physical Exam Constitutional:      Appearance: She is well-developed.  HENT:     Head: Normocephalic and atraumatic.     Right Ear: Tympanic membrane, ear canal and external ear normal.     Left Ear: Tympanic membrane, ear canal and external ear normal.     Nose: Nose normal.  Eyes:     Conjunctiva/sclera: Conjunctivae normal.     Pupils: Pupils are equal, round, and reactive to light.  Neck:     Thyroid: No thyromegaly.  Cardiovascular:     Rate and Rhythm: Normal rate and regular rhythm.     Heart sounds: Normal heart sounds.  Pulmonary:     Effort: Pulmonary effort is normal.     Breath sounds: Normal breath sounds. No wheezing.  Musculoskeletal:     Cervical back: Neck supple.  Lymphadenopathy:     Cervical: No cervical adenopathy.  Skin:    General: Skin is warm and dry.  Neurological:     Mental Status: She is alert and oriented to person, place, and time.  Psychiatric:        Mood and Affect: Mood normal.     Results for orders placed or performed in visit on 03/23/22  CBC  Result Value Ref Range   WBC 7.3 3.8 - 10.8 Thousand/uL   RBC 4.02 3.80 - 5.10 Million/uL    Hemoglobin 12.9 11.7 - 15.5 g/dL  HCT 38.7 35.0 - 45.0 %   MCV 96.3 80.0 - 100.0 fL   MCH 32.1 27.0 - 33.0 pg   MCHC 33.3 32.0 - 36.0 g/dL   RDW 12.1 11.0 - 15.0 %   Platelets 324 140 - 400 Thousand/uL   MPV 9.9 7.5 - 12.5 fL  COMPLETE METABOLIC PANEL WITH GFR  Result Value Ref Range   Glucose, Bld 87 65 - 99 mg/dL   BUN 15 7 - 25 mg/dL   Creat 0.95 0.60 - 1.00 mg/dL   eGFR 61 > OR = 60 mL/min/1.8m   BUN/Creatinine Ratio NOT APPLICABLE 6 - 22 (calc)   Sodium 142 135 - 146 mmol/L   Potassium 4.5 3.5 - 5.3 mmol/L   Chloride 103 98 - 110 mmol/L   CO2 31 20 - 32 mmol/L   Calcium 8.5 (L) 8.6 - 10.4 mg/dL   Total Protein 6.8 6.1 - 8.1 g/dL   Albumin 4.2 3.6 - 5.1 g/dL   Globulin 2.6 1.9 - 3.7 g/dL (calc)   AG Ratio 1.6 1.0 - 2.5 (calc)   Total Bilirubin 0.4 0.2 - 1.2 mg/dL   Alkaline phosphatase (APISO) 87 37 - 153 U/L   AST 20 10 - 35 U/L   ALT 14 6 - 29 U/L  Lipid Panel w/reflex Direct LDL  Result Value Ref Range   Cholesterol 211 (H) <200 mg/dL   HDL 37 (L) > OR = 50 mg/dL   Triglycerides 312 (H) <150 mg/dL   LDL Cholesterol (Calc) 129 (H) mg/dL (calc)   Total CHOL/HDL Ratio 5.7 (H) <5.0 (calc)   Non-HDL Cholesterol (Calc) 174 (H) <130 mg/dL (calc)   Last metabolic panel Lab Results  Component Value Date   GLUCOSE 87 03/23/2022   NA 142 03/23/2022   K 4.5 03/23/2022   CL 103 03/23/2022   CO2 31 03/23/2022   BUN 15 03/23/2022   CREATININE 0.95 03/23/2022   EGFR 61 03/23/2022   CALCIUM 8.5 (L) 03/23/2022   PROT 6.8 03/23/2022   ALBUMIN 4.3 11/10/2016   BILITOT 0.4 03/23/2022   ALKPHOS 80 11/10/2016   AST 20 03/23/2022   ALT 14 03/23/2022   Last lipids Lab Results  Component Value Date   CHOL 211 (H) 03/23/2022   HDL 37 (L) 03/23/2022   LDLCALC 129 (H) 03/23/2022   TRIG 312 (H) 03/23/2022   CHOLHDL 5.7 (H) 03/23/2022        Assessment & Plan:    Routine Health Maintenance and Physical Exam  Immunization History  Administered Date(s) Administered    Fluad Quad(high Dose 65+) 06/13/2019, 08/10/2020, 07/26/2021   Influenza Split 08/30/2011, 08/07/2012   Influenza Whole 08/19/2008, 08/03/2010   Influenza, Seasonal, Injecte, Preservative Fre 07/22/2013   Influenza,inj,Quad PF,6+ Mos 09/23/2014, 06/25/2015, 09/08/2016   Influenza-Unspecified 06/27/2017, 08/11/2018   PFIZER(Purple Top)SARS-COV-2 Vaccination 01/01/2020, 01/22/2020, 08/24/2020   Pneumococcal Conjugate-13 09/23/2014   Pneumococcal Polysaccharide-23 06/02/2009   Td 05/16/2006   Tdap 12/18/2016   Zoster Recombinat (Shingrix) 11/22/2018, 05/14/2019   Zoster, Live 05/30/2011    Health Maintenance  Topic Date Due   COVID-19 Vaccine (4 - Booster for [facility]) 04/08/2022 (Originally 10/19/2020)   Hepatitis C Screening  02/28/2023 (Originally 07/16/1959)   INFLUENZA VACCINE  05/23/2022   TETANUS/TDAP  12/18/2026   Pneumonia Vaccine 81 Years old  Completed   DEXA SCAN  Completed   Zoster Vaccines- Shingrix  Completed   HPV VACCINES  Aged Out   COLONOSCOPY (Pts 45-430yrInsurance coverage will need to be confirmed)  Discontinued    Discussed health benefits of physical activity, and encouraged her to engage in regular exercise appropriate for her age and condition.  Problem List Items Addressed This Visit       Musculoskeletal and Integument   Osteopenia   Relevant Orders   DG Bone Density     Other   Memory impairment    We did do a Moca today.  Score of 23 out of 30.  She missed points on drawing the clock for visuospatial, and abstraction, and delayed recall.  This consistent with mild dementia.  We can discuss further at next visit.  I do think this could be contributing somewhat to some of her anxiety and low mood.  Plan to monitor carefully and repeat again in 6 to 12 months.       Major depression, recurrent, chronic (HCC)    I did increase her Wellbutrin for now but again can address with her new psychiatrist.       Relevant Medications   buPROPion  (WELLBUTRIN XL) 300 MG 24 hr tablet   Low iron   Relevant Orders   CBC (Completed)   COMPLETE METABOLIC PANEL WITH GFR (Completed)   Lipid Panel w/reflex Direct LDL (Completed)   Hyperlipidemia   Relevant Orders   CBC (Completed)   COMPLETE METABOLIC PANEL WITH GFR (Completed)   Lipid Panel w/reflex Direct LDL (Completed)   Other Visit Diagnoses     Wellness examination    -  Primary      Wellness Exam -   Keep up a regular exercise program and make sure you are eating a healthy diet Try to eat 4 servings of dairy a day, or if you are lactose intolerant take a calcium with vitamin D daily.  Your vaccines are up to date.   Return in about 6 months (around 09/22/2022) for medications .     Beatrice Lecher, MD

## 2022-03-24 NOTE — Progress Notes (Signed)
Hi Carolyn Hendrix. Please make sure you are taking you daily calcium for your bones. Your cholesterol went up compared to last year. Try to stay active and eat healthy. No anemia.

## 2022-03-27 NOTE — Assessment & Plan Note (Addendum)
We did do a Moca today.  Score of 23 out of 30.  She missed points on drawing the clock for visuospatial, and abstraction, and delayed recall.  This consistent with mild dementia.  We can discuss further at next visit.  I do think this could be contributing somewhat to some of her anxiety and low mood.  Plan to monitor carefully and repeat again in 6 to 12 months.

## 2022-03-27 NOTE — Assessment & Plan Note (Signed)
I did increase her Wellbutrin for now but again can address with her new psychiatrist.

## 2022-03-29 ENCOUNTER — Other Ambulatory Visit: Payer: Self-pay | Admitting: Family Medicine

## 2022-04-06 ENCOUNTER — Other Ambulatory Visit: Payer: Self-pay | Admitting: Psychiatry

## 2022-04-06 NOTE — Telephone Encounter (Deleted)
Pt would like refill of [address] [place] Recent Vital Signs   @VS @   Past Medical History:  Diagnosis Date   Allergy    Anxiety June 2021   Still ongoing   Arthritis    Avulsion fracture of right talus 06/27/2017   Cataract    Chronic LBP    Lumbar DDD   Colitis, ischemic (HCC)    Depression    Endometriosis    GERD (gastroesophageal reflux disease)    Hematuria    Hyperlipidemia    IBS (irritable bowel syndrome)    Fr Hurrelbrink- on Miralax and Citrucel daily   Migraines    MVP (mitral valve prolapse)      Expected Discharge Date  @FLOW 08/27/2017  Diet Order     None        VTE Documentation  @FLOW   Work Intensity Score/Level of Care  @FLOW (10536::1)@  @LEVELOFCARE @   Mobility  @FLOW (7060220::1)@     Significant Events    DC Barriers   Abnormal Labs:  (500938::1)@ 04/06/2022, 4:43 PM Xa

## 2022-04-06 NOTE — Telephone Encounter (Signed)
Pt would like refill of Xanax 0.5mg  as pharmacy still does not have 0.25mg .  [facility].  Pls call her back to let her know if it's too early to pick up because she is out of town and her daughter will be coming to pick it up for her.  No upcoming appt scheduled.

## 2022-04-28 DIAGNOSIS — M5136 Other intervertebral disc degeneration, lumbar region: Secondary | ICD-10-CM | POA: Diagnosis not present

## 2022-04-28 DIAGNOSIS — M7918 Myalgia, other site: Secondary | ICD-10-CM | POA: Diagnosis not present

## 2022-04-28 DIAGNOSIS — M48061 Spinal stenosis, lumbar region without neurogenic claudication: Secondary | ICD-10-CM | POA: Diagnosis not present

## 2022-04-28 DIAGNOSIS — M47816 Spondylosis without myelopathy or radiculopathy, lumbar region: Secondary | ICD-10-CM | POA: Diagnosis not present

## 2022-04-28 DIAGNOSIS — G8929 Other chronic pain: Secondary | ICD-10-CM | POA: Diagnosis not present

## 2022-04-28 DIAGNOSIS — M5459 Other low back pain: Secondary | ICD-10-CM | POA: Diagnosis not present

## 2022-05-02 ENCOUNTER — Ambulatory Visit (INDEPENDENT_AMBULATORY_CARE_PROVIDER_SITE_OTHER): Payer: Medicare Other | Admitting: Psychiatry

## 2022-05-02 ENCOUNTER — Encounter (HOSPITAL_COMMUNITY): Payer: Self-pay | Admitting: Psychiatry

## 2022-05-02 VITALS — BP 120/80 | Temp 97.7°F | Ht 66.0 in | Wt 149.0 lb

## 2022-05-02 DIAGNOSIS — F339 Major depressive disorder, recurrent, unspecified: Secondary | ICD-10-CM

## 2022-05-02 DIAGNOSIS — F411 Generalized anxiety disorder: Secondary | ICD-10-CM | POA: Diagnosis not present

## 2022-05-02 DIAGNOSIS — G4700 Insomnia, unspecified: Secondary | ICD-10-CM | POA: Diagnosis not present

## 2022-05-02 MED ORDER — ALPRAZOLAM 0.25 MG PO TABS: ORAL_TABLET | ORAL | 0 refills | Status: DC

## 2022-05-02 NOTE — Progress Notes (Signed)
Psychiatric Initial Adult Assessment   Patient Identification: Carolyn Hendrix MRN:  [id number] Date of Evaluation:  05/02/2022 Referral Source: primary care Chief Complaint:   Chief Complaint  Patient presents with   Depression   Establish Care   Visit Diagnosis:    ICD-10-CM   1. Major depression, recurrent, chronic (HCC)  F33.9     2. Generalized anxiety disorder  F41.1     3. Insomnia, unspecified type  G47.00       History of Present Illness:  81 years old widow White female, referred by pcp to establish care for depression, anxiety and possible Grief. States has seen psychiatrist in [place] but family wants to change services. Recently seen by dr. Madilyn Fireman with possilble diagnosis of mild dementia and also have adjusted wellbutrin for her depression.  Patient gives a long history of having depression she has been married 3 times second marriage lasted for 30 years.  She has 2 kids are grown up and there from the first marriage her current marriage ended up being a widow in 2020/10/29 and passed away.  Patient has been taking care of him but has had difficult time with his daughters who taught her to sign up for a nap before that marriage.  Patient was given a hard time from his daughters despite being caregiving for him.  Patient feels that she has not gone through the grief process because of the difficult time given by his daughters in general over the last 1 year she is slowly improving  Her primary care has adjusted her medication and has recently diagnosed also possible early dementia.  Patient feels her depression is manageable she is not feeling down depressed on a day-to-day basis she is not endorsing any psychotic symptoms or paranoia.  She is not having any manic symptoms currently or in the past.  She does have worries worries related to being lonely worries related to being able to find things to do to keep her self involved.  She does have a dog and has support from her  kids especially her daughter.  She feels she is not focusing on her past of difficult time spent with last marriage and specially with his daughters and trying to focus on here and now that is helped she feels her motivation and energy level is improving does not feel hopeless or despair on a day-to-day basis.  She feels medication adjustment and increasing the dose of bupropion has helped she does not feel the medication need to be adjusted more   She also suffers from pain condition currently is on pain medication and has been on a small dose of alprazolam that has helped her anxiety distress.  She wants to continue the small dose of alprazolam she understands the risk that it can cause fogginess and forgetfulness but she does not take more than prescribed and it is a small dose that helps her anxiety and excessive worries.    tim starModifying factor: current kids, dog, grand children  Severity improving recently with med adjustment and wellbutrin  Duration more then 2 years , has had depression 20 plus years  Denies suicidal thoughts Denies drug use ,   Past Psychiatric History: depression, anxiety  Previous Psychotropic Medications: Yes   Substance Abuse History in the last 12 months:  No.  Consequences of Substance Abuse: NA  Past Medical History:  Past Medical History:  Diagnosis Date   Allergy    Anxiety June 2021   Still ongoing  Arthritis    Avulsion fracture of right talus 06/27/2017   Cataract    Chronic LBP    Lumbar DDD   Colitis, ischemic (HCC)    Depression    Endometriosis    GERD (gastroesophageal reflux disease)    Hematuria    Hyperlipidemia    IBS (irritable bowel syndrome)    Fr Hurrelbrink- on Miralax and Citrucel daily   Migraines    MVP (mitral valve prolapse)     Past Surgical History:  Procedure Laterality Date   ABDOMINAL HYSTERECTOMY  1991   w/ bilat oophorectomy for endometriosis   APPENDECTOMY     BREAST BIOPSY      ESOPHAGOGASTRODUODENOSCOPY  2006   EYE SURGERY  01-2021   Cataracts   fused 5th digit right hand Right    HEMILAMINOTOMY LUMBAR SPINE  11/18/1991   Dr. Billey Chang   lapartomy  1995   OTHER SURGICAL HISTORY  1992, 1994   right breast biopsy- benign     SPINAL FUSION  08/18/1997   [facility]. Rowe Pavy [facility], L4-5 post fusion with iliac crest bone graft   TONSILLECTOMY AND ADENOIDECTOMY      Family Psychiatric History: denies  Family History:  Family History  Problem Relation Age of Onset   Alzheimer's disease Mother    Other Father        ACS? 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 She is getting better at taking care of herself as she had to take care of her husband for the past 2.5 years. She lives alone with her dog. Walk occ.    Social Determinants of Health   Financial Resource Strain: Low Risk  (02/23/2022)   Overall Financial Resource Strain (CARDIA)    Difficulty of Paying Living Expenses: Not hard at all  Food Insecurity: No Food Insecurity (02/23/2022)   Hunger Vital Sign    Worried About Running Out of Food in the Last Year: Never true     Ran Out of Food in the Last Year: Never true  Transportation Needs: No Transportation Needs (02/23/2022)   PRAPARE - Hydrologist (Medical): No    Lack of Transportation (Non-Medical): No  Physical Activity: Inactive (02/23/2022)   Exercise Vital Sign    Days of Exercise per Week: 0 days    Minutes of Exercise per Session: 0 min  Stress: No Stress Concern Present (02/23/2022)   [place]  Institute of Occupational Health - Occupational Stress Questionnaire    Feeling of Stress : Only a little  Social Connections: Moderately Integrated (02/27/2022)   Social Connection and Isolation Panel [NHANES]    Frequency of Communication with Friends and Family: More than three times a week    Frequency of Social Gatherings with Friends and Family: Twice a week    Attends Religious Services: More than 4 times per year    Active Member of [employer] or Organizations: Yes    Attends Archivist Meetings: More than 4 times per year    Marital Status: Widowed    Additional Social History: grew up with parents, no abuse, married 3 times . 2 kids currently retired,   Allergies:   Allergies  Allergen Reactions   Cymbalta [Duloxetine Hcl] Diarrhea   Morphine Hives    Metabolic Disorder Labs: No results found for: "HGBA1C", "MPG" No results found for: "PROLACTIN" Lab Results  Component Value Date   CHOL 211 (H) 03/23/2022   TRIG 312 (H) 03/23/2022   HDL 37 (L) 03/23/2022   CHOLHDL 5.7 (H) 03/23/2022   VLDL 34 (H) 11/10/2016   LDLCALC 129 (H) 03/23/2022   LDLCALC 109 (H) 12/24/2020   Lab Results  Component Value Date   TSH 1.81 11/16/2017    Therapeutic Level Labs: No results found for: "LITHIUM" No results found for: "CBMZ" No results found for: "VALPROATE"  Current Medications: Current Outpatient Medications  Medication Sig Dispense Refill   acyclovir ointment (ZOVIRAX) 5 % Apply 1 application topically every 3 (three) hours. 5 g 2   alendronate (FOSAMAX)  70 MG tablet Take 1 tablet (70 mg total) by mouth every 7 (seven) days. Take with a full glass of water on an empty stomach. 12 tablet 4   ALPRAZolam (XANAX) 0.25 MG tablet TAKE 1 TABLET BY MOUTH TWICE A DAY AS NEEDED FOR ANXIETY 60 tablet 2   ALPRAZolam (XANAX) 0.5 MG tablet TAKE 0.5 TABLETS BY MOUTH 2 TIMES DAILY AS NEEDED FOR ANXIETY. 30 tablet 0   buPROPion (WELLBUTRIN XL) 300 MG 24 hr tablet Take 1 tablet (300 mg total) by mouth daily. 90 tablet 1   Cholecalciferol (VITAMIN D3) 25 MCG (1000 UT) CAPS 1 capsule     dicyclomine (BENTYL) 10 MG capsule TAKE 1 CAPSULE BY MOUTH THREE TIMES A DAY AS NEEDED FOR SPASMS 270 capsule 0   glucosamine-chondroitin 500-400 MG tablet Take 1 tablet by mouth 2 (two) times daily.     Kava, Piper methysticum, (KAVA KAVA) 200 MG CAPS See admin instructions.     Magnesium 300 MG CAPS 1 capsule with a meal     methocarbamol (ROBAXIN) 750 MG tablet Take 1 tablet by mouth every 12 (twelve) hours.     Multiple Vitamins-Minerals (SENTRY) TABS See admin instructions.     omeprazole (PRILOSEC) 20 MG capsule TAKE 1 CAPSULE BY MOUTH EVERY DAY 90 capsule 3   Oxycodone HCl 10 MG TABS Take by mouth.     oxyCODONE-acetaminophen (PERCOCET) 10-325 MG tablet Take 1 tablet by mouth every 8 (eight) hours as needed.     pravastatin (PRAVACHOL) 40 MG tablet TAKE 1 TABLET BY MOUTH EVERY DAY 90 tablet 3   Probiotic Product (PROBIOTIC-10 PO) See admin instructions.     pseudoephedrine (SUDAFED) 60 MG tablet 1 tablet as needed     sertraline (ZOLOFT) 100 MG tablet TAKE 1 TABLET BY MOUTH EVERY DAY 90 tablet 1   traMADol (ULTRAM-ER) 200 MG 24 hr tablet Take 200  mg by mouth daily.     traZODone (DESYREL) 100 MG tablet TAKE 1 TABLET BY MOUTH EVERYDAY AT BEDTIME 90 tablet 0   TURMERIC PO Take by mouth daily.     No current facility-administered medications for this visit.      Psychiatric Specialty Exam: Review of Systems  Cardiovascular:  Negative for chest pain.  Neurological:   Negative for tremors.  Psychiatric/Behavioral:  Negative for agitation.     Blood pressure 120/80, temperature 97.7 F (36.5 C), height 5\' 6"  (1.676 m), weight 149 lb (67.6 kg).Body mass index is 24.05 kg/m.  General Appearance: Casual  Eye Contact:  Fair  Speech:  Normal Rate  Volume:  Decreased  Mood:  Euthymic  Affect:  Constricted  Thought Process:  Goal Directed  Orientation:  Full (Time, Place, and Person)  Thought Content:  Rumination  Suicidal Thoughts:  No  Homicidal Thoughts:  No  Memory:  Immediate;   Fair  Judgement:  Fair  Insight:  Fair  Psychomotor Activity:  Decreased  Concentration:  Concentration: Fair  Recall:  Fair  Fund of Knowledge:Good  Language: Good  Akathisia:  No  Handed:    AIMS (if indicated):  not done  Assets:  Financial Resources/Insurance Social Support  ADL's:  Intact  Cognition: WNL  Sleep:  Fair   Screenings: GAD-7    Flowsheet Row Office Visit from 03/23/2022 in [facility] Office Visit from 07/26/2021 in [facility] Office Visit from 05/26/2021 in [facility] Office Visit from 02/22/2021 in [facility] Video Visit from 01/20/2021 in [facility]  Total GAD-7 Score 4 6 2 2 7       Mini-Mental    Flowsheet Row Office Visit from 09/23/2014 in [facility]  Total Score (max 30 points ) 0      PHQ2-9    Flowsheet Row Office Visit from 05/02/2022 in [facility] [place] Office Visit from 02/27/2022 in [facility] Office Visit from 12/20/2021 in [facility] At [facility] Office Visit from 07/26/2021 in [facility] At [facility] Office Visit from 05/26/2021 in [facility] At [facility]  PHQ-2 Total Score 2 5 4 3  2   PHQ-9 Total Score 8 12 9 7 4       Flowsheet Row Office Visit from 05/02/2022 in [facility] AT [place] [facility] from 12/20/2020 in [facility] RISK CATEGORY No Risk No Risk       Assessment and Plan: as follows  MDD recurrent moderate with Grief specifier:  feels depression has been better for last couple of months, continue zoloft and current dose of wellbutrin, it has been helping would not add more medications and may benefit from therapy Overall she feels she is processing her grief now and depression is not getting worse in general she feels her depression has been improving can continue current medication regimen and call 07/03/2022 earlier if needed she has the support of her daughter who keeps an track of her medications and is a good support for her   GAD: worries related to past triggers, care giving and trauma related to her last husband daughters. Continue zoloft and is also on small dose of xanax, discussed its effect on memory especially with  caution considering she is on pain medications. Xanax does help can continue with caution low dose Xanax prescription sent other prescriptions were already prescribed by her primary care physician and reviewed  Insomnia : unspecifiedl; reviewed sleep hygiene, says trazadone helps sleep, can continue  Direct care time spent in office including face-to-face with patient chart review documentation and collaboration; 60 minutes  Collaboration of Care: Primary Care Provider AEB reviewed notes from Primary care and medications  Patient/Guardian was advised Release of Information must be obtained prior to any record release in order to collaborate their care with an outside provider. Patient/Guardian was advised if they have not already done so to contact the registration department to sign all necessary forms in order for [place] to release information regarding their care.   Consent:  Patient/Guardian gives verbal consent for treatment and assignment of benefits for services provided during this visit. Patient/Guardian expressed understanding and agreed to proceed.  Follow-up in 5 to 6 weeks or earlier if needed Thresa Ross, MD 7/11/20232:16 PM

## 2022-05-04 ENCOUNTER — Ambulatory Visit: Payer: Medicare Other | Admitting: Sports Medicine

## 2022-05-08 ENCOUNTER — Ambulatory Visit: Payer: Medicare Other | Admitting: Sports Medicine

## 2022-05-12 IMAGING — DX DG SHOULDER 2+V*L*
3 series · 3 of 3 positions shown · non-contrast
Comparison: None.

CLINICAL DATA: Worsening left shoulder pain.

EXAM:
LEFT SHOULDER - 2+ VIEW

[shoulder grashey]
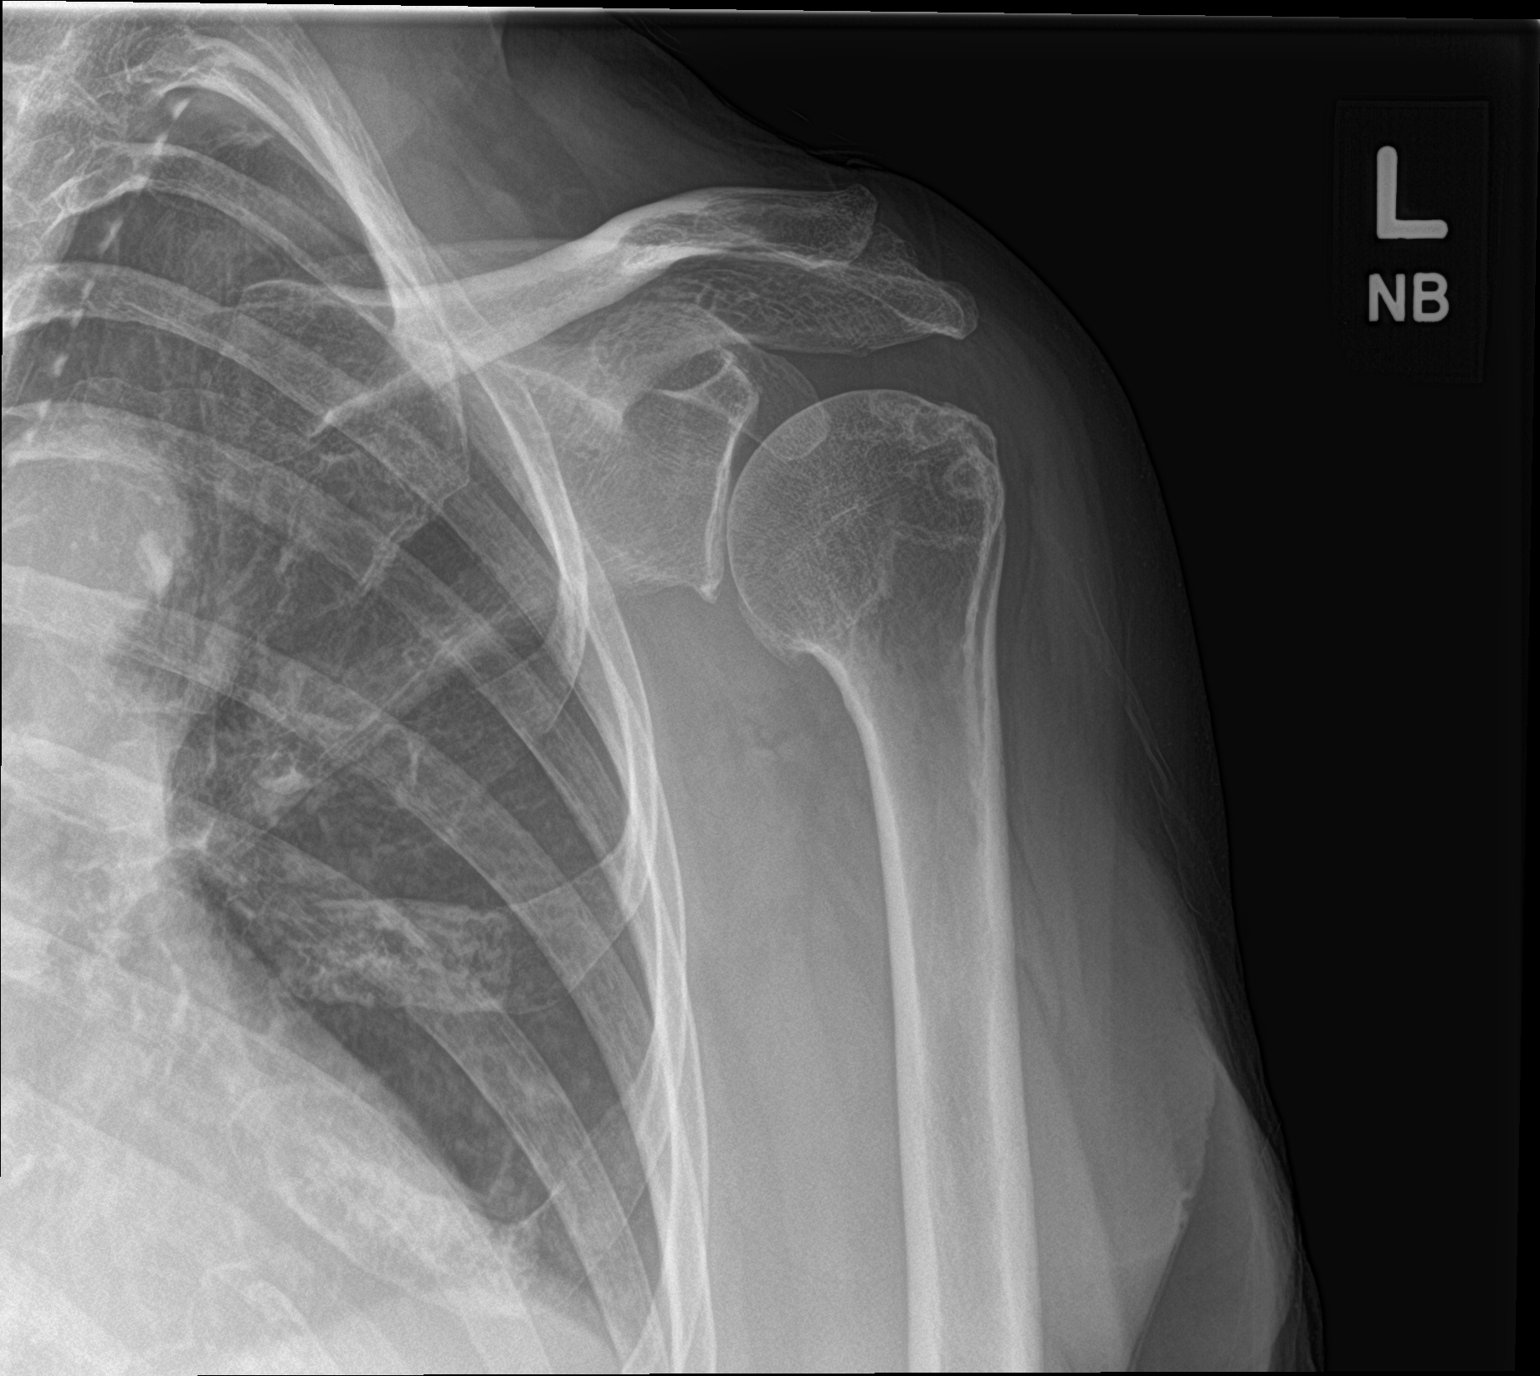

[shoulder y view]
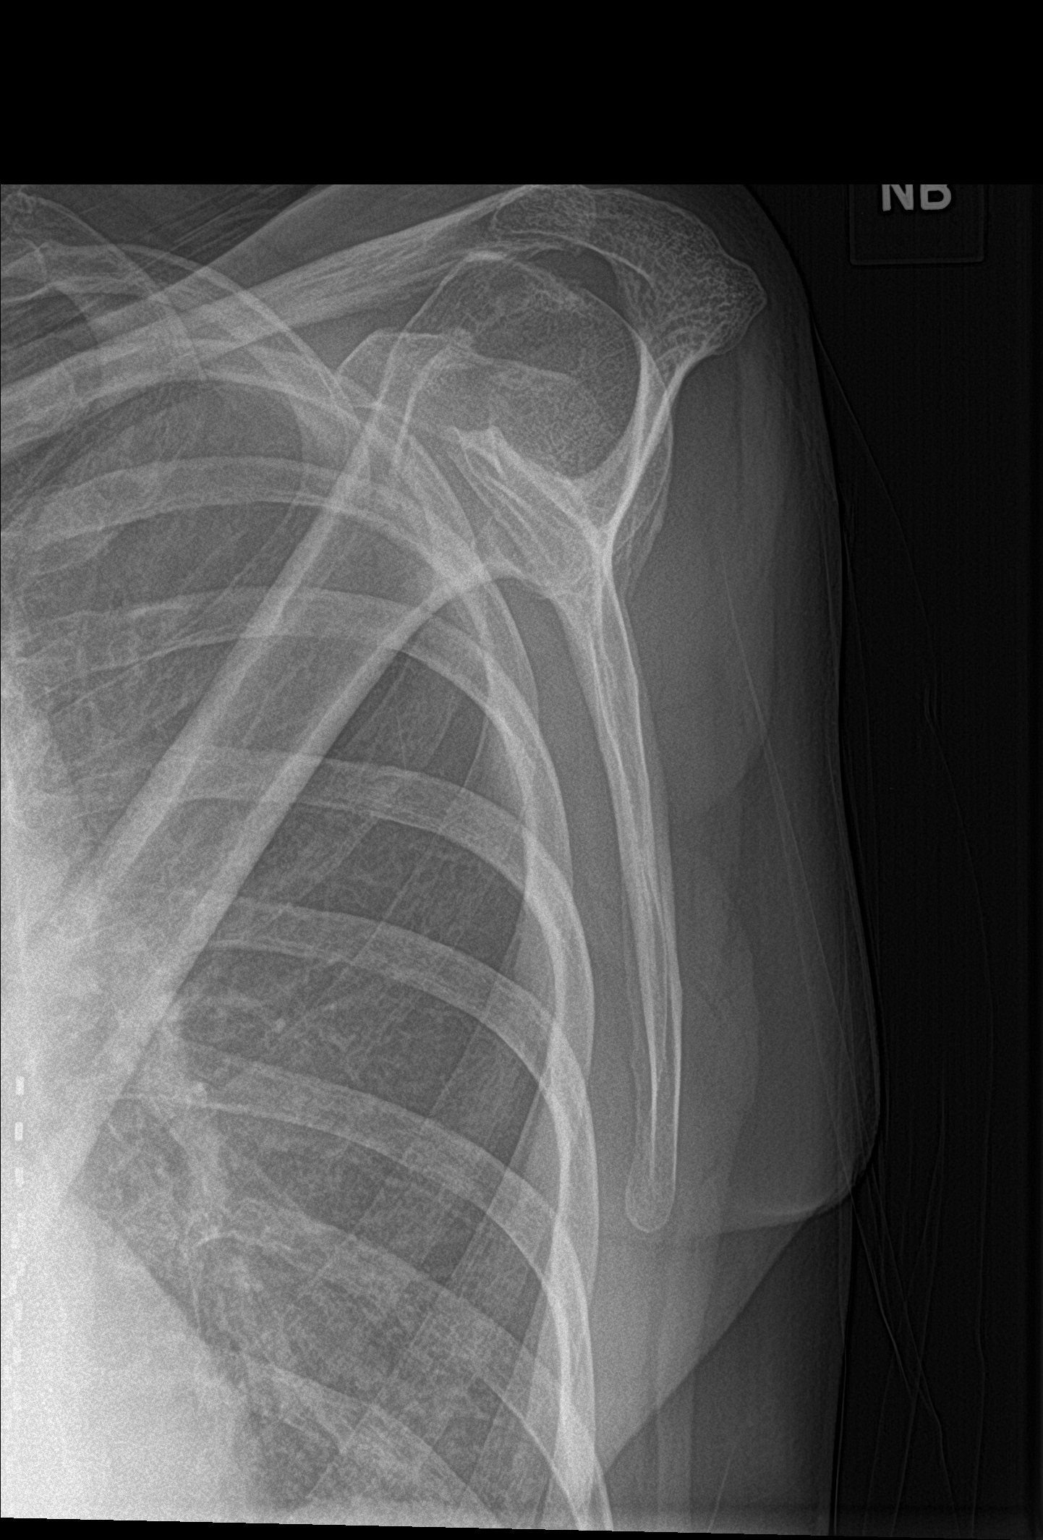

[shoulder axillary]
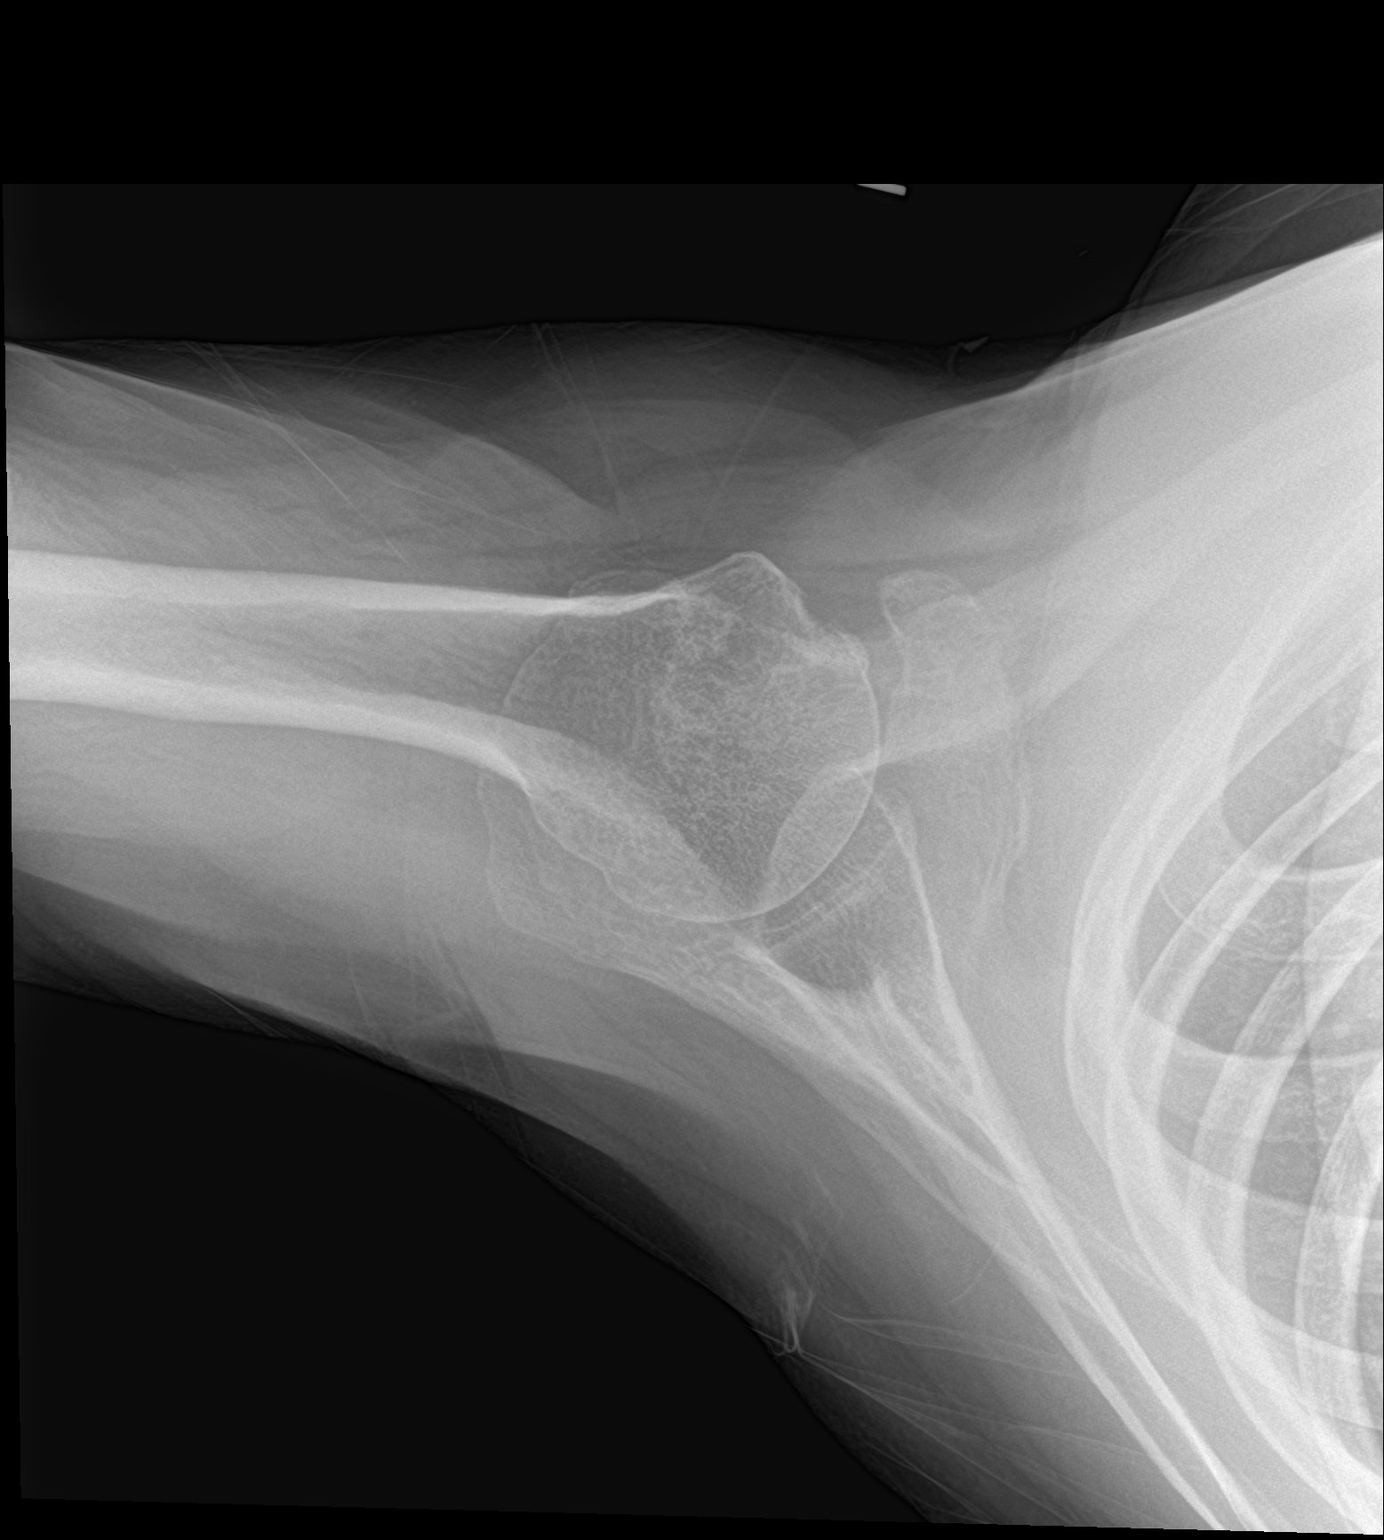

[3 of 3 positions shown; findings below may reference images not displayed]

FINDINGS: Mild degenerative changes over the AC joint and glenohumeral joints.
No evidence of acute fracture or dislocation.
IMPRESSION: 1. No acute findings.
2. Mild degenerative changes.

## 2022-05-15 ENCOUNTER — Other Ambulatory Visit: Payer: Self-pay

## 2022-05-16 ENCOUNTER — Ambulatory Visit (INDEPENDENT_AMBULATORY_CARE_PROVIDER_SITE_OTHER): Payer: Medicare Other

## 2022-05-16 ENCOUNTER — Ambulatory Visit (INDEPENDENT_AMBULATORY_CARE_PROVIDER_SITE_OTHER): Payer: Medicare Other | Admitting: Sports Medicine

## 2022-05-16 DIAGNOSIS — M47897 Other spondylosis, lumbosacral region: Secondary | ICD-10-CM | POA: Diagnosis not present

## 2022-05-16 DIAGNOSIS — M75102 Unspecified rotator cuff tear or rupture of left shoulder, not specified as traumatic: Secondary | ICD-10-CM

## 2022-05-16 DIAGNOSIS — M5137 Other intervertebral disc degeneration, lumbosacral region: Secondary | ICD-10-CM

## 2022-05-16 DIAGNOSIS — M51379 Other intervertebral disc degeneration, lumbosacral region without mention of lumbar back pain or lower extremity pain: Secondary | ICD-10-CM

## 2022-05-16 DIAGNOSIS — M12812 Other specific arthropathies, not elsewhere classified, left shoulder: Secondary | ICD-10-CM

## 2022-05-16 NOTE — Assessment & Plan Note (Signed)
Carolyn Hendrix also has a complex lumbar history including lumbar fusion, spinal cord stimulator, she did really well after a right sacroiliac joint back in December, repeated by Dr. Laurian Brim without significant relief so she was not a candidate for sacroiliac joint RFA, it looks like Dr. Laurian Brim was considering facet RFA, she would like some trigger point injections today so this was performed. Return to see me as needed, keep further follow-up for back pain with Dr. Laurian Brim.

## 2022-05-16 NOTE — Assessment & Plan Note (Signed)
Carolyn Hendrix also has rotator cuff tear arthropathy left shoulder, last injection was glenohumeral back in December and she did well, repeated today. Return as needed.

## 2022-05-16 NOTE — Progress Notes (Signed)
    Procedures performed today:    Procedure: Real-time Ultrasound Guided injection of the left glenohumeral joint Device: Samsung [id number]  Verbal informed consent obtained.  Time-out conducted.  Noted no overlying erythema, induration, or other signs of local infection.  Skin prepped in a sterile fashion.  Local anesthesia: Topical Ethyl chloride.  With sterile technique and under real time ultrasound guidance: Noted mild effusion, 1 cc Kenalog 40, 2 cc lidocaine, 2 cc bupivacaine injected easily Completed without difficulty  Advised to call if fevers/chills, erythema, induration, drainage, or persistent bleeding.  Images permanently stored and available for review in PACS.  Impression: Technically successful ultrasound guided injection.  Procedure:  Injection of #4 trigger points into the semispinalis lumborum and quadratus lumborum. Consent obtained and verified. Time-out conducted. Noted no overlying erythema, induration, or other signs of local infection. Skin prepped in a sterile fashion. Topical analgesic spray: Ethyl chloride. Completed without difficulty. Meds: I spent 1 cc kenalog 40, 2 cc lidocaine, 2 cc bupivacaine between the 4 trigger points. Advised to call if fevers/chills, erythema, induration, drainage, or persistent bleeding.  Independent interpretation of notes and tests performed by another provider:   None.  Brief History, Exam, Impression, and Recommendations:    Rotator cuff tear arthropathy, left Carli also has rotator cuff tear arthropathy left shoulder, last injection was glenohumeral back in December and she did well, repeated today. Return as needed.  Lumbosacral spondylosis [place] also has a complex lumbar history including lumbar fusion, spinal cord stimulator, she did really well after a right sacroiliac joint back in December, repeated by Dr. Laurian Brim without significant relief so she was not a candidate for sacroiliac joint RFA, it looks like Dr.  Laurian Brim was considering facet RFA, she would like some trigger point injections today so this was performed. Return to see me as needed, keep further follow-up for back pain with Dr. Laurian Brim.    ____________________________________________ Ihor Austin. Benjamin Stain, M.D., ABFM., CAQSM., AME. Primary Care and Sports Medicine [facility]  Adjunct Professor of Family Medicine  [employer]  Restaurant manager, fast food

## 2022-05-21 ENCOUNTER — Other Ambulatory Visit: Payer: Self-pay | Admitting: Family Medicine

## 2022-05-24 ENCOUNTER — Other Ambulatory Visit: Payer: Self-pay | Admitting: Family Medicine

## 2022-05-24 DIAGNOSIS — F5101 Primary insomnia: Secondary | ICD-10-CM

## 2022-05-24 DIAGNOSIS — F32A Depression, unspecified: Secondary | ICD-10-CM

## 2022-05-29 ENCOUNTER — Other Ambulatory Visit: Payer: Self-pay | Admitting: Family Medicine

## 2022-06-04 ENCOUNTER — Other Ambulatory Visit: Payer: Self-pay | Admitting: Family Medicine

## 2022-06-04 DIAGNOSIS — M8580 Other specified disorders of bone density and structure, unspecified site: Secondary | ICD-10-CM

## 2022-06-05 ENCOUNTER — Ambulatory Visit (INDEPENDENT_AMBULATORY_CARE_PROVIDER_SITE_OTHER): Payer: Medicare Other | Admitting: Licensed Clinical Social Worker

## 2022-06-05 ENCOUNTER — Encounter (HOSPITAL_COMMUNITY): Payer: Self-pay

## 2022-06-05 DIAGNOSIS — F339 Major depressive disorder, recurrent, unspecified: Secondary | ICD-10-CM | POA: Diagnosis not present

## 2022-06-05 DIAGNOSIS — F411 Generalized anxiety disorder: Secondary | ICD-10-CM

## 2022-06-05 NOTE — Plan of Care (Signed)
  Problem: Depression CCP Problem  1 Get old self back  Goal:  Kellis will manage mood, grief, and physical pain as evidenced by getting back to church, getting out of the home with family, cope with the past, and increase motivation for 5 out of 7 days for 60 days.  Outcome: Initial Goal: STG: Carolyn Hendrix WILL IDENTIFY 5 COGNITIVE PATTERNS AND BELIEFS THAT SUPPORT DEPRESSION Outcome: Initial
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
## 2022-06-13 ENCOUNTER — Other Ambulatory Visit (HOSPITAL_COMMUNITY): Payer: Self-pay | Admitting: Psychiatry

## 2022-06-13 DIAGNOSIS — F411 Generalized anxiety disorder: Secondary | ICD-10-CM

## 2022-06-14 DIAGNOSIS — M47816 Spondylosis without myelopathy or radiculopathy, lumbar region: Secondary | ICD-10-CM | POA: Diagnosis not present

## 2022-06-14 DIAGNOSIS — M5459 Other low back pain: Secondary | ICD-10-CM | POA: Diagnosis not present

## 2022-06-19 ENCOUNTER — Ambulatory Visit (INDEPENDENT_AMBULATORY_CARE_PROVIDER_SITE_OTHER): Payer: Medicare Other | Admitting: Licensed Clinical Social Worker

## 2022-06-19 DIAGNOSIS — F339 Major depressive disorder, recurrent, unspecified: Secondary | ICD-10-CM | POA: Diagnosis not present

## 2022-06-20 NOTE — Progress Notes (Signed)
   THERAPIST PROGRESS NOTE  Session Time: 1:00 pm-1:45 pm  Type of Therapy: Individual Therapy  Purpose of session: Shamar will manage mood, grief, and physical pain as evidenced by getting back to church, getting out of the home with family, cope with the past, and increase motivation for 5 out of 7 days for 60 days.  ProgressTowards Goals: Initial  Interventions: Therapist utilized CBT and Solution focused brief therapy to address mood and physical pain. Therapist provided support and empathy to patient during session. Therapist explored patient's triggers for mood and physical pain. Therapist worked with patient to identify steps to manage her physical pain.   Effectiveness: Patient was oriented x4 (person, place, situation, and time). Patient was casually dressed, and appropriately groomed. Patient was alert, engaged, pleasant, and cooperative. Patient noted that her mood has been stable. She has thoughts of her deceased spouse but thinks of the good times such as him fishing, playing golf, or his sense of humor. She will think of how his daughters treated her: didn't help her when he was sick, didn't call to let her know he had passed, etc. She accepts this behavior from them. Patient feels like her pain level has increased. Patient has some tests done that will allow her to have a procedure to burn the nerves in her back for pain relief. Patient understood that movement will help with her pain relief (not getting stiff), and how she views her pain will help. Patient feels like while she is in pain she "will feel better." Patient also noted that despite her pain she can still enjoy herself such as spending time with her granddaughter.   Patient engaged in session. She responded well to interventions. Patient continues to meet criteria for Major depression, recurrent, chronic. Patient will continue in outpatient therapy due to being the least restrictive service to meet her needs. Patient made  minimal progress on her goal.   Suicidal/Homicidal: Nowithout intent/plan  Plan: Return again in 2-4 weeks.  Diagnosis: Major depression, recurrent, chronic (HCC)  Collaboration of Care: Psychiatrist AEB Dr. Thresa Ross  Patient/Guardian was advised Release of Information must be obtained prior to any record release in order to collaborate their care with an outside provider. Patient/Guardian was advised if they have not already done so to contact the registration department to sign all necessary forms in order for [place] to release information regarding their care.   Consent: Patient/Guardian gives verbal consent for treatment and assignment of benefits for services provided during this visit. Patient/Guardian expressed understanding and agreed to proceed.   Bynum Bellows, LCSW 06/20/2022

## 2022-06-29 ENCOUNTER — Encounter (HOSPITAL_COMMUNITY): Payer: Self-pay

## 2022-06-29 ENCOUNTER — Ambulatory Visit (HOSPITAL_COMMUNITY): Payer: Medicare Other | Admitting: Psychiatry

## 2022-07-24 ENCOUNTER — Encounter (HOSPITAL_COMMUNITY): Payer: Self-pay

## 2022-07-24 ENCOUNTER — Ambulatory Visit (HOSPITAL_COMMUNITY): Payer: Medicare Other | Admitting: Licensed Clinical Social Worker

## 2022-07-31 ENCOUNTER — Other Ambulatory Visit: Payer: Self-pay | Admitting: Family Medicine

## 2022-07-31 DIAGNOSIS — F5101 Primary insomnia: Secondary | ICD-10-CM

## 2022-07-31 DIAGNOSIS — M5459 Other low back pain: Secondary | ICD-10-CM | POA: Diagnosis not present

## 2022-07-31 DIAGNOSIS — M47816 Spondylosis without myelopathy or radiculopathy, lumbar region: Secondary | ICD-10-CM | POA: Diagnosis not present

## 2022-08-07 ENCOUNTER — Ambulatory Visit (INDEPENDENT_AMBULATORY_CARE_PROVIDER_SITE_OTHER): Payer: Medicare Other | Admitting: Licensed Clinical Social Worker

## 2022-08-07 DIAGNOSIS — F339 Major depressive disorder, recurrent, unspecified: Secondary | ICD-10-CM

## 2022-08-08 NOTE — Progress Notes (Signed)
   THERAPIST PROGRESS NOTE  Session Time: 11:00 am-11:45 am  Type of Therapy: Individual Therapy  Purpose of session: Carolyn Hendrix will manage mood, grief, and physical pain as evidenced by getting back to church, getting out of the home with family, cope with the past, and increase motivation for 5 out of 7 days for 60 days.  ProgressTowards Goals: Progressing  Interventions: Therapist utilized CBT and Solution focused brief therapy to address mood and physical pain. Therapist provided support and empathy to patient during session. Therapist explored patient's physical pain and impact on mood. Therapist worked with patient to manage anxiety.   Effectiveness: Patient was oriented x4 (person, place, situation, and time). Patient was neatly dressed, and appropriately groomed. Patient was alert, engaged, pleasant, and cooperative. Patient noted that her pain has increased. She did get a "block" which has helped alleviate some of the pain. She will be having a surgery to burn the nerves to alleviate pain. Patient had difficulty getting her medication from the pharmacy due the "process." She wasn't sure who is supposed to approve the medication the pharmacy or the doctor. She was getting anxious over this and had to find the right process. She felt like she had to do a lot of the work instead of getting helped. Patient noted that she can get "keyed up" at times which causes her heart to race and have difficulty with breathing. Patient understood that practicing slow, deep breathing can help with this as well as with other coping skills such as muscle relaxation, and challenging anxious thoughts. Patient is going to practice deep breathing when not anxious so that she can cope when she is anxious.   Patient engaged in session. She responded well to interventions. Patient continues to meet criteria for Major depression, recurrent, chronic. Patient will continue in outpatient therapy due to being the least  restrictive service to meet her needs. Patient made minimal progress on her goal.   Suicidal/Homicidal: Nowithout intent/plan  Plan: Return again in 2-4 weeks.  Diagnosis: Major depression, recurrent, chronic (HCC)  Collaboration of Care: Psychiatrist AEB Dr. Merian Capron  Patient/Guardian was advised Release of Information must be obtained prior to any record release in order to collaborate their care with an outside provider. Patient/Guardian was advised if they have not already done so to contact the registration department to sign all necessary forms in order for [place] to release information regarding their care.   Consent: Patient/Guardian gives verbal consent for treatment and assignment of benefits for services provided during this visit. Patient/Guardian expressed understanding and agreed to proceed.   Glori Bickers, LCSW 08/08/2022

## 2022-08-09 ENCOUNTER — Ambulatory Visit: Payer: Medicare Other

## 2022-08-18 ENCOUNTER — Encounter: Payer: Self-pay | Admitting: Family Medicine

## 2022-08-20 ENCOUNTER — Other Ambulatory Visit: Payer: Self-pay | Admitting: Family Medicine

## 2022-08-20 DIAGNOSIS — F32A Depression, unspecified: Secondary | ICD-10-CM

## 2022-08-21 ENCOUNTER — Ambulatory Visit: Payer: Medicare Other | Admitting: Sports Medicine

## 2022-08-21 ENCOUNTER — Ambulatory Visit (INDEPENDENT_AMBULATORY_CARE_PROVIDER_SITE_OTHER): Payer: Medicare Other | Admitting: Licensed Clinical Social Worker

## 2022-08-21 DIAGNOSIS — F339 Major depressive disorder, recurrent, unspecified: Secondary | ICD-10-CM

## 2022-08-21 DIAGNOSIS — M47897 Other spondylosis, lumbosacral region: Secondary | ICD-10-CM

## 2022-08-21 NOTE — Progress Notes (Signed)
   THERAPIST PROGRESS NOTE  Session Time: 11:00 am-11:45 am  Type of Therapy: Individual Therapy  Purpose of session: Jla will manage mood, grief, and physical pain as evidenced by getting back to church, getting out of the home with family, cope with the past, and increase motivation for 5 out of 7 days for 60 days.  ProgressTowards Goals: Progressing  Interventions: Therapist utilized CBT and Solution focused brief therapy to address mood and physical pain. Therapist provided support and empathy to patient during session.  Therapist worked with patient to identify what was most important to her and what stands in the way of the things she values.   Effectiveness: Patient was oriented x4 (person, place, situation, and time). Patient was casually dressed, and appropriately groomed. Patient was alert, engaged, pleasant, and cooperative. Patient noted that she has felt some anxiety but it is more likely due to her pain level and not sleeping. Patient was able to identify what is important to her including: her family, her dog, pain management, church/spirituality and some independence. Patient recognized that her pain, tired, and worry of falling or feeling judged stops her from doing some things. To cope with those feelings, she will avoid situations, etc. Patient noted that she could try to attend a 10:30 am worship service on Sunday to be around friends, increase her spirituality, and get back to how she was before her "mental breakdown."   Patient engaged in session. She responded well to interventions. Patient continues to meet criteria for Major depression, recurrent, chronic. Patient will continue in outpatient therapy due to being the least restrictive service to meet her needs. Patient made minimal progress on her goal.   Suicidal/Homicidal: Nowithout intent/plan  Plan: Return again in 2-4 weeks.  Diagnosis: Major depression, recurrent, chronic (HCC)  Collaboration of Care:  Psychiatrist AEB Dr. Merian Capron  Patient/Guardian was advised Release of Information must be obtained prior to any record release in order to collaborate their care with an outside provider. Patient/Guardian was advised if they have not already done so to contact the registration department to sign all necessary forms in order for [place] to release information regarding their care.   Consent: Patient/Guardian gives verbal consent for treatment and assignment of benefits for services provided during this visit. Patient/Guardian expressed understanding and agreed to proceed.   Glori Bickers, LCSW 08/21/2022

## 2022-08-21 NOTE — Assessment & Plan Note (Signed)
Carolyn Hendrix is a pleasant 81 year old female, she is a very complex lumbar history including lumbar fusion, spinal cord stimulator, she did well after SI joint injection in December, this was on the right side. It sounds like she had 1 on the left with Carolyn Hendrix without significant relief. She was does not a candidate for sacroiliac joint RFA. She just had facet medial branch blocks that were successful x2, so she is scheduled for facet RFA which will be done sometime in December. Unfortunately having significant pain and was hoping for another injection today, I did inform her that I could not do her facets, and that because she did not get great relief from the SI joints it was probably not worthwhile targeting these again. Her pain was a little higher up somewhere mid lumbar and paralumbar musculature as well as quadratus lumborum. We did trigger point injections today into the semispinalis lumborum and quadratus lumborum. I again stressed the importance of keeping all of her spine care with Carolyn Hendrix.

## 2022-08-21 NOTE — Progress Notes (Signed)
    Procedures performed today:    Procedure:  Injection of #4 trigger points into the semispinalis lumborum and quadratus lumborum. Consent obtained and verified. Time-out conducted. Noted no overlying erythema, induration, or other signs of local infection. Skin prepped in a sterile fashion. Topical analgesic spray: Ethyl chloride. Completed without difficulty. Meds: I spent 1 cc kenalog 40, 2 cc lidocaine, 2 cc bupivacaine between the 4 trigger points. Advised to call if fevers/chills, erythema, induration, drainage, or persistent bleeding.  Independent interpretation of notes and tests performed by another provider:   None.  Brief History, Exam, Impression, and Recommendations:    Lumbosacral spondylosis Carolyn Hendrix is a pleasant 81 year old female, she is a very complex lumbar history including lumbar fusion, spinal cord stimulator, she did well after SI joint injection in December, this was on the right side. It sounds like she had 1 on the left with Dr. Francesco Runner without significant relief. She was does not a candidate for sacroiliac joint RFA. She just had facet medial branch blocks that were successful x2, so she is scheduled for facet RFA which will be done sometime in December. Unfortunately having significant pain and was hoping for another injection today, I did inform her that I could not do her facets, and that because she did not get great relief from the SI joints it was probably not worthwhile targeting these again. Her pain was a little higher up somewhere mid lumbar and paralumbar musculature as well as quadratus lumborum. We did trigger point injections today into the semispinalis lumborum and quadratus lumborum. I again stressed the importance of keeping all of her spine care with Dr. Francesco Runner.    ____________________________________________ Gwen Her. Dianah Hendrix, M.D., ABFM., CAQSM., AME. Primary Care and Sports Medicine [facility]  Adjunct  Professor of [place] [employer]  Risk manager

## 2022-08-25 ENCOUNTER — Other Ambulatory Visit: Payer: Self-pay | Admitting: Family Medicine

## 2022-08-25 DIAGNOSIS — F5101 Primary insomnia: Secondary | ICD-10-CM

## 2022-08-30 ENCOUNTER — Ambulatory Visit: Payer: Medicare Other | Admitting: Family Medicine

## 2022-08-30 ENCOUNTER — Encounter: Payer: Self-pay | Admitting: Family Medicine

## 2022-08-30 ENCOUNTER — Other Ambulatory Visit: Payer: Self-pay | Admitting: *Deleted

## 2022-08-30 VITALS — BP 143/70 | HR 96 | Wt 145.0 lb

## 2022-08-30 DIAGNOSIS — R8290 Unspecified abnormal findings in urine: Secondary | ICD-10-CM

## 2022-08-30 DIAGNOSIS — H8110 Benign paroxysmal vertigo, unspecified ear: Secondary | ICD-10-CM

## 2022-08-30 LAB — POCT URINALYSIS DIP (CLINITEK)
Bilirubin, UA: NEGATIVE
Glucose, UA: NEGATIVE mg/dL
Ketones, POC UA: NEGATIVE mg/dL
Nitrite, UA: NEGATIVE
POC PROTEIN,UA: NEGATIVE
Spec Grav, UA: >=1.03 — AB (ref 1.010–1.025)
Urobilinogen, UA: 0.2 E.U./dL
pH, UA: 5 (ref 5.0–8.0)

## 2022-08-30 NOTE — Progress Notes (Signed)
Acute Office Visit  Subjective:     Patient ID: Carolyn Hendrix, female    DOB: 06-29-1941, 81 y.o.   MRN: [id number]  Chief Complaint  Patient presents with   Dizziness    HPI Patient is in today for his dizziness.  She says started around last Thursday. she started feeling a little bit dizzy like the room was spinning.  She has had vertigo before.  She also noticed that she was having a little bit more congestion than usual normally her sinuses drained pretty well so she ended up using her Nettie pot a couple of times and even took some Mucinex and Sudafed.  She says the dizziness was intense enough about 4 days ago that she actually vomited once.  Also just wanted to rule out a UTI.  The about a year ago she presented with dizziness and actually fell and was diagnosed with a UTI she is not currently having any hematuria or dysuria or fever.  But she did do 2 home test for UTI and says that it was positive.  Had significant balance issues back in October 2021.  She underwent vestibular rehab at that time with Margretta Ditty.  Has started seeing a therapist for her anxiety and is improving.    ROS      Objective:    BP (!) 143/70   Pulse 96   Wt 145 lb (65.8 kg)   SpO2 95%   BMI 23.40 kg/m    Physical Exam Constitutional:      Appearance: She is well-developed.  HENT:     Head: Normocephalic and atraumatic.     Right Ear: External ear normal.     Left Ear: External ear normal.     Nose: Nose normal.  Eyes:     Conjunctiva/sclera: Conjunctivae normal.     Pupils: Pupils are equal, round, and reactive to light.  Neck:     Thyroid: No thyromegaly.  Cardiovascular:     Rate and Rhythm: Normal rate and regular rhythm.     Heart sounds: Normal heart sounds.  Pulmonary:     Effort: Pulmonary effort is normal.     Breath sounds: Normal breath sounds. No wheezing.  Musculoskeletal:     Cervical back: Neck supple.  Lymphadenopathy:     Cervical: No cervical  adenopathy.  Skin:    General: Skin is warm and dry.  Neurological:     Mental Status: She is alert and oriented to person, place, and time.     Comments: Negative Dix-Hallpike maneuver though she did experience some mild vertigo from laying to sitting up with her head turned to the right.     Results for orders placed or performed in visit on 08/30/22  POCT URINALYSIS DIP (CLINITEK)  Result Value Ref Range   Color, UA yellow yellow   Clarity, UA hazy (A) clear   Glucose, UA negative negative mg/dL   Bilirubin, UA negative negative   Ketones, POC UA negative negative mg/dL   Spec Grav, UA >=5.366 (A) 1.010 - 1.025   Blood, UA small (A) negative   pH, UA 5.0 5.0 - 8.0   POC PROTEIN,UA negative negative, trace   Urobilinogen, UA 0.2 0.2 or 1.0 E.U./dL   Nitrite, UA Negative Negative   Leukocytes, UA Small (1+) (A) Negative        Assessment & Plan:   Problem List Items Addressed This Visit   None Visit Diagnoses     Benign paroxysmal positional vertigo,  unspecified laterality    -  Primary   Abnormal urine finding       Relevant Orders   POCT URINALYSIS DIP (CLINITEK) (Completed)       BPPV-symptoms were more consistent to the right.  Handout provided to do exercises on her own at home.  If not feeling better after the weekend then can refer to PT for see further treatment with vestibular rehab.  Abnormal urine home test. Asymptomatic.  Will send for culture. INcrease water intake.    No orders of the defined types were placed in this encounter.   Return if symptoms worsen or fail to improve.  Nani Gasser, MD

## 2022-08-30 NOTE — Patient Instructions (Signed)
Call if not improving with your vertigo after the weekend.

## 2022-08-30 NOTE — Progress Notes (Signed)
Pt reports that her sxs began 1.5 weeks ago but got worse over the weekend. She stated that she is experiencing sinus issues and she tried using her Nettie pot on Sunday 2x on Sunday night and this didn't help it actually got worse. She has also been using mucinex and sudafed she took this on Monday and stated that this has gotten some better.   Denies any f/s/c

## 2022-08-30 NOTE — Addendum Note (Signed)
Addended by: Deno Etienne on: 08/30/2022 01:46 PM   Modules accepted: Orders

## 2022-09-01 LAB — URINE CULTURE
MICRO NUMBER:: 14166206
SPECIMEN QUALITY:: ADEQUATE

## 2022-09-01 NOTE — Progress Notes (Signed)
Hi Carolyn Hendrix, your urine culture was negative.  No worrisome findings of infection.

## 2022-09-04 ENCOUNTER — Ambulatory Visit (HOSPITAL_COMMUNITY): Payer: Medicare Other | Admitting: Licensed Clinical Social Worker

## 2022-09-13 DIAGNOSIS — G8929 Other chronic pain: Secondary | ICD-10-CM | POA: Diagnosis not present

## 2022-09-13 DIAGNOSIS — M5459 Other low back pain: Secondary | ICD-10-CM | POA: Diagnosis not present

## 2022-09-13 DIAGNOSIS — M5136 Other intervertebral disc degeneration, lumbar region: Secondary | ICD-10-CM | POA: Diagnosis not present

## 2022-09-13 DIAGNOSIS — Z79891 Long term (current) use of opiate analgesic: Secondary | ICD-10-CM | POA: Diagnosis not present

## 2022-09-13 DIAGNOSIS — M48061 Spinal stenosis, lumbar region without neurogenic claudication: Secondary | ICD-10-CM | POA: Diagnosis not present

## 2022-09-13 DIAGNOSIS — G894 Chronic pain syndrome: Secondary | ICD-10-CM | POA: Diagnosis not present

## 2022-09-13 DIAGNOSIS — M7918 Myalgia, other site: Secondary | ICD-10-CM | POA: Diagnosis not present

## 2022-09-13 DIAGNOSIS — M47816 Spondylosis without myelopathy or radiculopathy, lumbar region: Secondary | ICD-10-CM | POA: Diagnosis not present

## 2022-09-20 NOTE — Progress Notes (Unsigned)
Virtual Visit via Video Note  I connected with [name] on 09/21/22 at 10:50 AM EST by a video enabled telemedicine application and verified that I am speaking with the correct person using two identifiers.   I discussed the limitations of evaluation and management by telemedicine and the availability of in person appointments. The patient expressed understanding and agreed to proceed.  Patient location: at home Provider location: in office  Subjective:    CC:   Chief Complaint  Patient presents with   Sinusitis   Back Pain    HPI:  Pt stated that she is having normal pain. She has an appointment witn Dr. Laurian Brim for an ablation next week.   She stated that she has a head cold for the past week and it has gone into her chest. She has been Mucinex, hydrating and doing broths. Her drainage has been clear. Denies any fevers,sweats or chills. She hasn't done a Covid test. She hasn't been around anyone who has been sick that she knows of. She hasn't had any facial or ear pain/pressure. Cough is more productive. No ST or earache.  No fever,    F/U insomnia - doing well with her sleep on the trazodone.   Past medical history, Surgical history, Family history not pertinant except as noted below, Social history, Allergies, and medications have been entered into the medical record, reviewed, and corrections made.    Objective:    General: Speaking clearly in complete sentences without any shortness of breath.  Alert and oriented x3.  Normal judgment. No apparent acute distress.    Impression and Recommendations:    Problem List Items Addressed This Visit       Other   Primary insomnia    Doing well on trazodone.  Continue current regimen.      Major depression, recurrent, chronic (HCC) - Primary    At some point she would like to consider tapering down the medication.  We discussed maybe doing this in the spring or the early summer when I see her back if she is doing  really well we can discontinue the Wellbutrin 150 and continue with the Zoloft and the Wellbutrin 300.  But she has been stable on this regimen and it has taken [place] a while to get to a good regimen for her so any change that we make I want to do very slowly.      Relevant Medications   buPROPion (WELLBUTRIN XL) 150 MG 24 hr tablet   buPROPion (WELLBUTRIN XL) 300 MG 24 hr tablet   Other Visit Diagnoses     Sinobronchitis       Relevant Medications   azithromycin (ZITHROMAX) 250 MG tablet   Depressive disorder       Relevant Medications   buPROPion (WELLBUTRIN XL) 150 MG 24 hr tablet   buPROPion (WELLBUTRIN XL) 300 MG 24 hr tablet       Sinobronchitis-we discussed that this still could be viral she has already been sick for a week.  If its not improving over the weekend or if she feels like she is suddenly getting worse okay to fill prescription for azithromycin.  Call if not better in 1 week.  No orders of the defined types were placed in this encounter.   Meds ordered this encounter  Medications   azithromycin (ZITHROMAX) 250 MG tablet    Sig: 2 Ttabs PO on Day 1, then one a day x 4 days.    Dispense:  6 tablet    Refill:  0   buPROPion (WELLBUTRIN XL) 150 MG 24 hr tablet    Sig: Take 1 tablet (150 mg total) by mouth daily.    Dispense:  90 tablet    Refill:  1   buPROPion (WELLBUTRIN XL) 300 MG 24 hr tablet    Sig: Take 1 tablet (300 mg total) by mouth daily.    Dispense:  90 tablet    Refill:  1     I discussed the assessment and treatment plan with the patient. The patient was provided an opportunity to ask questions and all were answered. The patient agreed with the plan and demonstrated an understanding of the instructions.   The patient was advised to call back or seek an in-person evaluation if the symptoms worsen or if the condition fails to improve as anticipated.   Nani Gasser, MD

## 2022-09-21 ENCOUNTER — Encounter: Payer: Self-pay | Admitting: Family Medicine

## 2022-09-21 ENCOUNTER — Telehealth: Payer: Medicare Other | Admitting: Family Medicine

## 2022-09-21 DIAGNOSIS — J329 Chronic sinusitis, unspecified: Secondary | ICD-10-CM

## 2022-09-21 DIAGNOSIS — F339 Major depressive disorder, recurrent, unspecified: Secondary | ICD-10-CM

## 2022-09-21 DIAGNOSIS — J40 Bronchitis, not specified as acute or chronic: Secondary | ICD-10-CM

## 2022-09-21 DIAGNOSIS — F32A Depression, unspecified: Secondary | ICD-10-CM | POA: Diagnosis not present

## 2022-09-21 DIAGNOSIS — F5101 Primary insomnia: Secondary | ICD-10-CM

## 2022-09-21 MED ORDER — BUPROPION HCL ER (XL) 300 MG PO TB24: 300.0000 mg | ORAL_TABLET | Freq: Every day | ORAL | 1 refills | Status: DC

## 2022-09-21 MED ORDER — BUPROPION HCL ER (XL) 150 MG PO TB24: 150.0000 mg | ORAL_TABLET | Freq: Every day | ORAL | 1 refills | Status: DC

## 2022-09-21 MED ORDER — AZITHROMYCIN 250 MG PO TABS: ORAL_TABLET | ORAL | 0 refills | Status: AC

## 2022-09-21 NOTE — Assessment & Plan Note (Signed)
Doing well on trazodone.  Continue current regimen.

## 2022-09-21 NOTE — Assessment & Plan Note (Signed)
At some point she would like to consider tapering down the medication.  We discussed maybe doing this in the spring or the early summer when I see her back if she is doing really well we can discontinue the Wellbutrin 150 and continue with the Zoloft and the Wellbutrin 300.  But she has been stable on this regimen and it has taken [place] a while to get to a good regimen for her so any change that we make I want to do very slowly.

## 2022-09-21 NOTE — Progress Notes (Signed)
Pt stated that she is having normal pain. She has an appointment witn Dr. Laurian Brim for an ablation next week.  She stated that she has a head cold for the past week and it has gone into her chest. She has been Mucinex, hydrating and doing broths.   Her drainage has been clear. Denies any fevers,sweats or chills. She hasn't done a Covid test. She hasn't been around anyone who has been sick that she knows of.   She hasn't had any facial or ear pain/pressure

## 2022-09-22 ENCOUNTER — Ambulatory Visit: Payer: Medicare Other | Admitting: Family Medicine

## 2022-09-26 DIAGNOSIS — M5459 Other low back pain: Secondary | ICD-10-CM | POA: Diagnosis not present

## 2022-09-26 DIAGNOSIS — M47816 Spondylosis without myelopathy or radiculopathy, lumbar region: Secondary | ICD-10-CM | POA: Diagnosis not present

## 2022-10-28 ENCOUNTER — Other Ambulatory Visit: Payer: Self-pay | Admitting: Family Medicine

## 2022-10-31 ENCOUNTER — Ambulatory Visit: Payer: Medicare Other | Admitting: Sports Medicine

## 2022-11-06 ENCOUNTER — Ambulatory Visit (INDEPENDENT_AMBULATORY_CARE_PROVIDER_SITE_OTHER): Payer: Medicare Other

## 2022-11-06 ENCOUNTER — Ambulatory Visit: Payer: Medicare Other | Admitting: Sports Medicine

## 2022-11-06 DIAGNOSIS — M12812 Other specific arthropathies, not elsewhere classified, left shoulder: Secondary | ICD-10-CM | POA: Diagnosis not present

## 2022-11-06 DIAGNOSIS — M75102 Unspecified rotator cuff tear or rupture of left shoulder, not specified as traumatic: Secondary | ICD-10-CM

## 2022-11-06 MED ORDER — TRIAMCINOLONE ACETONIDE 40 MG/ML IJ SUSP
40.0000 mg | Freq: Once | INTRAMUSCULAR | Status: AC
  Administered 2022-11-06: 40 mg via INTRAMUSCULAR

## 2022-11-06 NOTE — Assessment & Plan Note (Signed)
Pleasant 82 year old female, known rotator cuff tear arthropathy left shoulder, she has done well with glenohumeral joint injections, last injection was July 2023, she is having recurrence of pain, repeat left glenohumeral joint injection, return to see me as needed.

## 2022-11-06 NOTE — Progress Notes (Signed)
    Procedures performed today:    Procedure: Real-time Ultrasound Guided injection of the left glenohumeral joint Device: Samsung HS60  Verbal informed consent obtained.  Time-out conducted.  Noted no overlying erythema, induration, or other signs of local infection.  Skin prepped in a sterile fashion.  Local anesthesia: Topical Ethyl chloride.  With sterile technique and under real time ultrasound guidance: Arthritic joint noted, 1 cc Kenalog 40, 2 cc lidocaine, 2 cc bupivacaine injected easily Completed without difficulty  Advised to call if fevers/chills, erythema, induration, drainage, or persistent bleeding.  Images permanently stored and available for review in PACS.  Impression: Technically successful ultrasound guided injection.  Independent interpretation of notes and tests performed by another provider:   None.  Brief History, Exam, Impression, and Recommendations:    Rotator cuff tear arthropathy, left Pleasant 82 year old female, known rotator cuff tear arthropathy left shoulder, she has done well with glenohumeral joint injections, last injection was July 2023, she is having recurrence of pain, repeat left glenohumeral joint injection, return to see me as needed.    ____________________________________________ Gwen Her. Dianah Field, M.D., ABFM., CAQSM., AME. Primary Care and Sports Medicine [facility]  Adjunct Professor of [employer] [facility] [employer]  Risk manager

## 2022-11-08 DIAGNOSIS — M47816 Spondylosis without myelopathy or radiculopathy, lumbar region: Secondary | ICD-10-CM | POA: Diagnosis not present

## 2022-11-08 DIAGNOSIS — M48061 Spinal stenosis, lumbar region without neurogenic claudication: Secondary | ICD-10-CM | POA: Diagnosis not present

## 2022-11-08 DIAGNOSIS — M5136 Other intervertebral disc degeneration, lumbar region: Secondary | ICD-10-CM | POA: Diagnosis not present

## 2022-11-08 DIAGNOSIS — G894 Chronic pain syndrome: Secondary | ICD-10-CM | POA: Diagnosis not present

## 2022-11-08 DIAGNOSIS — M5459 Other low back pain: Secondary | ICD-10-CM | POA: Diagnosis not present

## 2022-11-10 ENCOUNTER — Telehealth: Payer: Self-pay | Admitting: Family Medicine

## 2022-11-10 DIAGNOSIS — Z79899 Other long term (current) drug therapy: Secondary | ICD-10-CM

## 2022-11-10 DIAGNOSIS — R5383 Other fatigue: Secondary | ICD-10-CM

## 2022-11-10 DIAGNOSIS — R790 Abnormal level of blood mineral: Secondary | ICD-10-CM

## 2022-11-10 NOTE — Telephone Encounter (Signed)
Good morning Dr. Madilyn Fireman.. The patient daughter sent message below.  Hi there This is Carolyn Hendrix, Carolyn Hendrix's daughter.  Mom is very tired, back pain bad & weak.  She hasn't had blood work since last June in 2023.  Is it possible for Dr Madilyn Fireman to put an order in for her CBC, Complete metabolic panel and lipid panel?  Including her ferritin levels?  So we could bring her by soon, get her blood work done , then the results will be back when she comes in to see Dr Suzi Roots on 11/23/22.   Last question,  My mom feels that the extra  150mg  Wellbutrin could be causing some of the tiredness.  Does she have to wean off of the 150 mg slowly in increments or could she stop taking that at once since she's also taking 300mg  a day?  She will have a family member with her everyday or every 2 days to keep an eye on how she's doing.     She's also getting scheduled for MRI of lumbar region to get some more answers about her back  Is there a request for bloodwork for patient ? I can then call patient to have them come to complete such before their scheduled appointment if this is what you would like to request. .

## 2022-11-10 NOTE — Telephone Encounter (Signed)
Labs ordered [facility] to stop the 150mg  dose and continue with just the 300mg s odse of wellbutrin

## 2022-11-10 NOTE — Telephone Encounter (Signed)
Patient advised.

## 2022-11-13 ENCOUNTER — Telehealth: Payer: Self-pay | Admitting: Family Medicine

## 2022-11-13 DIAGNOSIS — R42 Dizziness and giddiness: Secondary | ICD-10-CM

## 2022-11-13 NOTE — Telephone Encounter (Signed)
Hi there, can you please add an order for urinalysis test for Carolyn Hendrix?  We will be there Tuesday, Jan 23rd late morning for her other lab work.    Doneta Public [phone]

## 2022-11-14 ENCOUNTER — Encounter: Payer: Self-pay | Admitting: Family Medicine

## 2022-11-14 DIAGNOSIS — R42 Dizziness and giddiness: Secondary | ICD-10-CM | POA: Diagnosis not present

## 2022-11-14 DIAGNOSIS — R5383 Other fatigue: Secondary | ICD-10-CM | POA: Diagnosis not present

## 2022-11-14 DIAGNOSIS — R82998 Other abnormal findings in urine: Secondary | ICD-10-CM | POA: Diagnosis not present

## 2022-11-14 DIAGNOSIS — R790 Abnormal level of blood mineral: Secondary | ICD-10-CM | POA: Diagnosis not present

## 2022-11-14 DIAGNOSIS — Z79899 Other long term (current) drug therapy: Secondary | ICD-10-CM | POA: Diagnosis not present

## 2022-11-15 LAB — COMPLETE METABOLIC PANEL WITH GFR
AG Ratio: 1.7 (calc) (ref 1.0–2.5)
ALT: 11 U/L (ref 6–29)
AST: 16 U/L (ref 10–35)
Albumin: 4.5 g/dL (ref 3.6–5.1)
Alkaline phosphatase (APISO): 78 U/L (ref 37–153)
BUN/Creatinine Ratio: 15 (calc) (ref 6–22)
BUN: 17 mg/dL (ref 7–25)
CO2: 29 mmol/L (ref 20–32)
Calcium: 9.9 mg/dL (ref 8.6–10.4)
Chloride: 96 mmol/L — ABNORMAL LOW (ref 98–110)
Creat: 1.12 mg/dL — ABNORMAL HIGH (ref 0.60–1.00)
Globulin: 2.7 g/dL (calc) (ref 1.9–3.7)
Glucose, Bld: 86 mg/dL (ref 65–99)
Potassium: 4.7 mmol/L (ref 3.5–5.3)
Sodium: 138 mmol/L (ref 135–146)
Total Bilirubin: 0.5 mg/dL (ref 0.2–1.2)
Total Protein: 7.2 g/dL (ref 6.1–8.1)
eGFR: 50 mL/min/{1.73_m2} — ABNORMAL LOW (ref >=60)

## 2022-11-15 LAB — URINALYSIS
Bilirubin Urine: NEGATIVE
Glucose, UA: NEGATIVE
Hgb urine dipstick: NEGATIVE
Ketones, ur: NEGATIVE
Leukocytes,Ua: NEGATIVE
Nitrite: NEGATIVE
Protein, ur: NEGATIVE
Specific Gravity, Urine: 1.022 (ref 1.001–1.035)
pH: 7.5 (ref 5.0–8.0)

## 2022-11-15 LAB — CBC WITH DIFFERENTIAL/PLATELET
Absolute Monocytes: 540 cells/uL (ref 200–950)
Basophils Absolute: 90 cells/uL (ref 0–200)
Basophils Relative: 0.9 %
Eosinophils Absolute: 70 cells/uL (ref 15–500)
Eosinophils Relative: 0.7 %
HCT: 41.4 % (ref 35.0–45.0)
Hemoglobin: 14.3 g/dL (ref 11.7–15.5)
Lymphs Abs: 2120 cells/uL (ref 850–3900)
MCH: 32.4 pg (ref 27.0–33.0)
MCHC: 34.5 g/dL (ref 32.0–36.0)
MCV: 93.9 fL (ref 80.0–100.0)
MPV: 9.7 fL (ref 7.5–12.5)
Monocytes Relative: 5.4 %
Neutro Abs: 7180 cells/uL (ref 1500–7800)
Neutrophils Relative %: 71.8 %
Platelets: 423 10*3/uL — ABNORMAL HIGH (ref 140–400)
RBC: 4.41 10*6/uL (ref 3.80–5.10)
RDW: 13 % (ref 11.0–15.0)
Total Lymphocyte: 21.2 %
WBC: 10 10*3/uL (ref 3.8–10.8)

## 2022-11-15 LAB — URINE CULTURE
MICRO NUMBER:: 14461926
Result:: NO GROWTH
SPECIMEN QUALITY:: ADEQUATE

## 2022-11-15 LAB — TSH+FREE T4: TSH W/REFLEX TO FT4: 1.7 mIU/L (ref 0.40–4.50)

## 2022-11-15 LAB — FERRITIN: Ferritin: 83 ng/mL (ref 16–288)

## 2022-11-15 NOTE — Progress Notes (Signed)
HI Carolyn Hendrix, your urine looks clear.  Will await the culture.

## 2022-11-15 NOTE — Progress Notes (Signed)
Hi Carolyn Hendrix, Ok to stop any extra iron, your ferritin looks great. Kidney function is stable. Thyroid looks good.

## 2022-11-17 ENCOUNTER — Telehealth: Payer: Self-pay | Admitting: Family Medicine

## 2022-11-17 NOTE — Progress Notes (Signed)
Hi Caeli, your urine sample was negative for any type of infection we just wanted to make sure that it was not a cause of some of your symptoms but it looks completely clear.  Hopefully you are feeling somewhat better.

## 2022-11-17 NOTE — Telephone Encounter (Signed)
Good Morning  Please tell Dr Madilyn Fireman I got her message about urine test. I am feeling better, dizziness got much better about a day after end of antibiotics. Thanks .

## 2022-11-22 DIAGNOSIS — M5136 Other intervertebral disc degeneration, lumbar region: Secondary | ICD-10-CM | POA: Diagnosis not present

## 2022-11-22 DIAGNOSIS — M4805 Spinal stenosis, thoracolumbar region: Secondary | ICD-10-CM | POA: Diagnosis not present

## 2022-11-22 DIAGNOSIS — M4316 Spondylolisthesis, lumbar region: Secondary | ICD-10-CM | POA: Diagnosis not present

## 2022-11-22 DIAGNOSIS — M47816 Spondylosis without myelopathy or radiculopathy, lumbar region: Secondary | ICD-10-CM | POA: Diagnosis not present

## 2022-11-23 ENCOUNTER — Encounter: Payer: Self-pay | Admitting: Family Medicine

## 2022-11-23 ENCOUNTER — Ambulatory Visit (INDEPENDENT_AMBULATORY_CARE_PROVIDER_SITE_OTHER): Payer: Medicare Other | Admitting: Family Medicine

## 2022-11-23 VITALS — BP 135/71 | HR 113 | Ht 66.0 in | Wt 143.0 lb

## 2022-11-23 DIAGNOSIS — F5101 Primary insomnia: Secondary | ICD-10-CM

## 2022-11-23 DIAGNOSIS — F339 Major depressive disorder, recurrent, unspecified: Secondary | ICD-10-CM

## 2022-11-23 DIAGNOSIS — N390 Urinary tract infection, site not specified: Secondary | ICD-10-CM | POA: Diagnosis not present

## 2022-11-23 NOTE — Patient Instructions (Signed)
Please stop the Wellbutrin 300 mg. Please start Wellbutrin 150 mg daily for 6 weeks.  After 6 weeks, okay to decrease down to 1 every other day for 2 weeks and then stop completely.  Increase sertraline to 1-1/2 tabs daily.  If you are doing well on this then please let me know so that I can send an updated prescription.  If you are having difficulty splitting the tab then let me know and we can send over separate prescription for 100 mg and 50 mg.

## 2022-11-23 NOTE — Assessment & Plan Note (Signed)
Continue with trazodone.  It sounds like her sleep is back on track.

## 2022-11-23 NOTE — Progress Notes (Signed)
Established Patient Office Visit  Subjective   Patient ID: Carolyn Hendrix, female    DOB: 05-Oct-1941  Age: 82 y.o. MRN: [id number]  Chief Complaint  Patient presents with   Follow-up    Discuss d/c'ing wellbutrin     HPI  F/U MDD -she is currently on sertraline 100 mg daily.  She is currently on 300 mg of Wellbutrin.  She recently reduced from 450 mg she is doing well would like to continue to taper off the Wellbutrin she is just not convinced that it is helping her anymore.  She reports that her anxiety has actually been more increased recently especially since her pain she feels is not well-controlled she feels like it really ramps up her anxiety.  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  She is on Percocet 10/325 and tramadol.  Follow-up insomnia-uses trazodone 100 mg nightly for sleep.  As she tapered off the Wellbutrin she noticed that she was having some sleep disruption in the middle the night but it does seem to be better.  She is also concerned about her recurrent UTIs and whether or not she should consult with urology.  When she gets them she tends to have very fast onset of symptoms that typically include dizziness and then she is even been on to pass out.  She does not really get a lot of dysuria or blood to let her know that she is having an infection    ROS    Objective:     BP 135/71   Pulse (!) 113   Ht 5\' 6"  (1.676 m)   Wt 143 lb (64.9 kg)   SpO2 96%   BMI 23.08 kg/m    Physical Exam Vitals and nursing note reviewed.  Constitutional:      Appearance: She is well-developed.  HENT:     Head: Normocephalic and atraumatic.  Cardiovascular:     Rate and Rhythm: Normal rate and regular rhythm.     Heart sounds: Normal heart sounds.  Pulmonary:     Effort: Pulmonary effort is normal.     Breath sounds: Normal breath sounds.  Skin:    General: Skin is warm and dry.  Neurological:     Mental Status: She is  alert and oriented to person, place, and time.  Psychiatric:        Behavior: Behavior normal.      No results found for any visits on 11/23/22.    The 10-year ASCVD risk score (Arnett DK, et al., 2019) is: 24.7%    Assessment & Plan:   Problem List Items Addressed This Visit       Other   Primary insomnia    Continue with trazodone.  It sounds like her sleep is back on track.      Major depression, recurrent, chronic ([place]) - Primary    Call is to continue to taper off the Wellbutrin completely.  We also discussed adjusting the sertraline up to see if that would help with the anxiety.  If that is not helpful or if she is having side effects encouraged her to call back.  She is open to trying one of the newer agents such as Trintellix if this is not successful.  Please stop the Wellbutrin 300 mg. Please start Wellbutrin 150 mg daily for 6 weeks.  After 6 weeks, okay to decrease down to 1 every other day for 2 weeks and then stop completely.  Increase sertraline to 1-1/2 tabs daily.  If you are doing well on this then please let me know so that I can send an updated prescription.  If you are having difficulty splitting the tab then let me know and we can send over separate prescription for 100 mg and 50 mg.      Other Visit Diagnoses     Recurrent UTI           reCurrent UTI-we did discuss the typical cause for UTIs and most women who are postmenopausal which is usually vaginal atrophy.  We discussed strategies around reducing risk for UTIs staying hydrated emptying the bladder regularly.  If she has more than 4 infections in a year then I do think urology referral would be warranted.  We also discussed the possibility of even using a topical estrogen cream if needed.  Return in about 2 months (around 01/22/2023) for Change in Mood medication. Beatrice Lecher, MD

## 2022-11-23 NOTE — Assessment & Plan Note (Addendum)
Call is to continue to taper off the Wellbutrin completely.  We also discussed adjusting the sertraline up to see if that would help with the anxiety.  If that is not helpful or if she is having side effects encouraged her to call back.  She is open to trying one of the newer agents such as Trintellix if this is not successful.  Please stop the Wellbutrin 300 mg. Please start Wellbutrin 150 mg daily for 6 weeks.  After 6 weeks, okay to decrease down to 1 every other day for 2 weeks and then stop completely.  Increase sertraline to 1-1/2 tabs daily.  If you are doing well on this then please let me know so that I can send an updated prescription.  If you are having difficulty splitting the tab then let me know and we can send over separate prescription for 100 mg and 50 mg.

## 2022-12-18 DIAGNOSIS — M461 Sacroiliitis, not elsewhere classified: Secondary | ICD-10-CM | POA: Diagnosis not present

## 2022-12-26 ENCOUNTER — Encounter: Payer: Self-pay | Admitting: Family Medicine

## 2022-12-26 DIAGNOSIS — M8580 Other specified disorders of bone density and structure, unspecified site: Secondary | ICD-10-CM

## 2022-12-26 MED ORDER — HYDROXYZINE PAMOATE 25 MG PO CAPS: 25.0000 mg | ORAL_CAPSULE | Freq: Three times a day (TID) | ORAL | 0 refills | Status: DC | PRN

## 2022-12-26 NOTE — Telephone Encounter (Signed)
Meds ordered this encounter  Medications   hydrOXYzine (VISTARIL) 25 MG capsule    Sig: Take 1 capsule (25 mg total) by mouth every 8 (eight) hours as needed.    Dispense:  30 capsule    Refill:  0

## 2022-12-29 ENCOUNTER — Other Ambulatory Visit: Payer: Self-pay | Admitting: Family Medicine

## 2023-01-08 ENCOUNTER — Encounter: Payer: Self-pay | Admitting: Family Medicine

## 2023-01-10 ENCOUNTER — Other Ambulatory Visit: Payer: Self-pay | Admitting: Family Medicine

## 2023-01-17 ENCOUNTER — Other Ambulatory Visit: Payer: Self-pay | Admitting: Family Medicine

## 2023-01-17 DIAGNOSIS — F5101 Primary insomnia: Secondary | ICD-10-CM

## 2023-01-17 MED ORDER — HYDROXYZINE PAMOATE 25 MG PO CAPS: 25.0000 mg | ORAL_CAPSULE | Freq: Three times a day (TID) | ORAL | 1 refills | Status: DC | PRN

## 2023-01-29 DIAGNOSIS — G894 Chronic pain syndrome: Secondary | ICD-10-CM | POA: Diagnosis not present

## 2023-01-29 DIAGNOSIS — M48061 Spinal stenosis, lumbar region without neurogenic claudication: Secondary | ICD-10-CM | POA: Diagnosis not present

## 2023-01-29 DIAGNOSIS — M25571 Pain in right ankle and joints of right foot: Secondary | ICD-10-CM | POA: Diagnosis not present

## 2023-01-29 DIAGNOSIS — M5136 Other intervertebral disc degeneration, lumbar region: Secondary | ICD-10-CM | POA: Diagnosis not present

## 2023-01-29 DIAGNOSIS — M47816 Spondylosis without myelopathy or radiculopathy, lumbar region: Secondary | ICD-10-CM | POA: Diagnosis not present

## 2023-01-29 DIAGNOSIS — M5459 Other low back pain: Secondary | ICD-10-CM | POA: Diagnosis not present

## 2023-01-30 ENCOUNTER — Other Ambulatory Visit: Payer: Self-pay | Admitting: Family Medicine

## 2023-02-06 ENCOUNTER — Encounter: Payer: Self-pay | Admitting: Family Medicine

## 2023-02-06 ENCOUNTER — Ambulatory Visit (INDEPENDENT_AMBULATORY_CARE_PROVIDER_SITE_OTHER): Payer: Medicare Other | Admitting: Family Medicine

## 2023-02-06 VITALS — BP 124/62 | HR 86 | Ht 66.0 in | Wt 147.0 lb

## 2023-02-06 DIAGNOSIS — R251 Tremor, unspecified: Secondary | ICD-10-CM

## 2023-02-06 DIAGNOSIS — F339 Major depressive disorder, recurrent, unspecified: Secondary | ICD-10-CM

## 2023-02-06 DIAGNOSIS — F411 Generalized anxiety disorder: Secondary | ICD-10-CM

## 2023-02-06 NOTE — Patient Instructions (Signed)
Try to cut your trazodone in half for sleep and see if tolerating well.

## 2023-02-06 NOTE — Progress Notes (Signed)
Established Patient Office Visit  Subjective   Patient ID: Carolyn Hendrix, female    DOB: 08-Aug-1941  Age: 82 y.o. MRN: [id number]  Chief Complaint  Patient presents with   mood    HPI  Month follow-up for depression/anxiety-we decided to taper off the Wellbutrin because she felt like it really was not helping all that much and really being an added benefit so we have tapered from 450 mg all the way down to being off of the medication.  We did discuss increasing the sertraline to 1-1/2 tabs.  Was particularly struggling with anxiety when I last saw her in February. She finally feels like her self again. She is cleaning her house and she is driving locally. Not on the highway yet.    She did have bilateral SI injections and got some good relief but then brought to the beach with her dog in her lap and it started flaring again.  She is scheduled to have repeat injections done in May.    ROS    Objective:     BP 124/62   Pulse 86   Ht  (1.676 m)   Wt 147 lb (66.7 kg)   SpO2 96%   BMI 23.73 kg/m    Physical Exam Vitals and nursing note reviewed.  Constitutional:      Appearance: She is well-developed.  HENT:     Head: Normocephalic and atraumatic.  Cardiovascular:     Rate and Rhythm: Normal rate and regular rhythm.     Heart sounds: Normal heart sounds.  Pulmonary:     Effort: Pulmonary effort is normal.     Breath sounds: Normal breath sounds.  Skin:    General: Skin is warm and dry.  Neurological:     Mental Status: She is alert and oriented to person, place, and time.  Psychiatric:        Behavior: Behavior normal.      No results found for any visits on 02/06/23.    The 10-year ASCVD risk score (Arnett DK, et al., 2019) is: 21.4%    Assessment & Plan:   Problem List Items Addressed This Visit       Other   Tremor    Does have an intermittent tremor which is not related to her anxiety.  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  Will keep an eye on it.      Major depression, recurrent, chronic - Primary    She is really doing fantastic.  I am glad we decided to taper off the Wellbutrin.  In fact she is actually been feeling better off of it.  She is actually taking a 1-1/2 of the sertraline but would like to try going back down to 1.  We did discuss doing a trial for a month.  She can always go back up to 1-1/2.  If she does decide to stay at 1-1/2 tabs she will just need to let me know so that I can update her prescription otherwise if she is doing well I will see her back in 4 months.  I am just glad to see that she is feeling in good spirits.      Anxiety state   Other Visit Diagnoses     GAD (generalized anxiety disorder)           Return in about 4 months (around 06/08/2023) for Mood medication .    Santina Evans  Madilyn Fireman, MD

## 2023-02-06 NOTE — Assessment & Plan Note (Signed)
She is really doing fantastic.  I am glad we decided to taper off the Wellbutrin.  In fact she is actually been feeling better off of it.  She is actually taking a 1-1/2 of the sertraline but would like to try going back down to 1.  We did discuss doing a trial for a month.  She can always go back up to 1-1/2.  If she does decide to stay at 1-1/2 tabs she will just need to let me know so that I can update her prescription otherwise if she is doing well I will see her back in 4 months.  I am just glad to see that she is feeling in good spirits.

## 2023-02-06 NOTE — Assessment & Plan Note (Signed)
Does have an intermittent tremor which is not related to her anxiety.  Suspect it is probably just an early essential tremor some days it is not even noticeable.  Will keep an eye on it.

## 2023-02-15 ENCOUNTER — Other Ambulatory Visit (INDEPENDENT_AMBULATORY_CARE_PROVIDER_SITE_OTHER): Payer: Medicare Other

## 2023-02-15 ENCOUNTER — Ambulatory Visit (INDEPENDENT_AMBULATORY_CARE_PROVIDER_SITE_OTHER): Payer: Medicare Other | Admitting: Sports Medicine

## 2023-02-15 DIAGNOSIS — M12812 Other specific arthropathies, not elsewhere classified, left shoulder: Secondary | ICD-10-CM

## 2023-02-15 DIAGNOSIS — M75102 Unspecified rotator cuff tear or rupture of left shoulder, not specified as traumatic: Secondary | ICD-10-CM

## 2023-02-15 NOTE — Assessment & Plan Note (Signed)
Known rotator cuff tear arthropathy, last injection January of this year, repeat left glenohumeral joint injection, return as needed.

## 2023-02-15 NOTE — Progress Notes (Signed)
    Procedures performed today:    Procedure: Real-time Ultrasound Guided injection of the left glenohumeral joint Device: Samsung HS60  Verbal informed consent obtained.  Time-out conducted.  Noted no overlying erythema, induration, or other signs of local infection.  Skin prepped in a sterile fashion.  Local anesthesia: Topical Ethyl chloride.  With sterile technique and under real time ultrasound guidance: Arthritic joint noted, 1 cc Kenalog 40, 2 cc lidocaine, 2 cc bupivacaine injected easily Completed without difficulty  Advised to call if fevers/chills, erythema, induration, drainage, or persistent bleeding.  Images permanently stored and available for review in PACS.  Impression: Technically successful ultrasound guided injection.  Independent interpretation of notes and tests performed by another provider:   None.  Brief History, Exam, Impression, and Recommendations:    Rotator cuff tear arthropathy, left Known rotator cuff tear arthropathy, last injection January of this year, repeat left glenohumeral joint injection, return as needed.    ____________________________________________ Ihor Austin. Benjamin Stain, M.D., ABFM., CAQSM., AME. Primary Care and Sports Medicine [facility]  Adjunct Professor of Family Medicine  [employer]  Restaurant manager, fast food

## 2023-02-17 ENCOUNTER — Other Ambulatory Visit: Payer: Self-pay | Admitting: Family Medicine

## 2023-02-17 DIAGNOSIS — F5101 Primary insomnia: Secondary | ICD-10-CM

## 2023-02-19 ENCOUNTER — Encounter: Payer: Self-pay | Admitting: Family Medicine

## 2023-02-19 NOTE — Telephone Encounter (Signed)
Form placed in the provider's box. Partially completed, pending signature and review from provider. Fee form attached.

## 2023-02-20 NOTE — Telephone Encounter (Signed)
Completed and placed in tony B box

## 2023-03-05 ENCOUNTER — Ambulatory Visit (INDEPENDENT_AMBULATORY_CARE_PROVIDER_SITE_OTHER): Payer: Medicare Other | Admitting: Family Medicine

## 2023-03-05 DIAGNOSIS — Z780 Asymptomatic menopausal state: Secondary | ICD-10-CM

## 2023-03-05 DIAGNOSIS — Z0000 Encounter for general adult medical examination without abnormal findings: Secondary | ICD-10-CM | POA: Diagnosis not present

## 2023-03-05 NOTE — Progress Notes (Signed)
MEDICARE ANNUAL WELLNESS VISIT  03/05/2023  Telephone Visit Disclaimer This Medicare AWV was conducted by telephone due to national recommendations for restrictions regarding the COVID-19 Pandemic (e.g. social distancing).  I verified, using two identifiers, that I am speaking with Carolyn Hendrix or their authorized healthcare agent. I discussed the limitations, risks, security, and privacy concerns of performing an evaluation and management service by telephone and the potential availability of an in-person appointment in the future. The patient expressed understanding and agreed to proceed.  Location of Patient: Home Location of Provider (nurse):  In the office.  Subjective:    Carolyn Hendrix is a 82 y.o. female patient of Metheney, Carolyn Ehlers, MD who had a Medicare Annual Wellness Visit today via telephone. Carolyn Hendrix is Retired and lives alone. she has 2 children. she reports that she is socially active and does interact with friends/family regularly. she is minimally physically active and enjoys crocheting.   Patient Care Team: Agapito Games, MD as PCP - General (Family Medicine) Rosario Jacks, MD as Referring Physician (Obstetrics and Gynecology) Charyl Bigger, MD as Consulting Physician (Anesthesiology) Farris Has, MD as Referring Physician (Dermatology)     03/05/2023    9:09 AM 02/27/2022   10:57 AM 11/29/2020   11:16 AM 11/25/2019   11:09 AM 11/19/2018   11:17 AM 05/23/2017    3:21 PM 09/11/2016    1:54 PM  Advanced Directives  Does Patient Have a Medical Advance Directive? Yes Yes Yes Yes Yes Yes Yes  Type of Advance Directive Living will Living will Healthcare Power of [place];Living will Healthcare Power of [place];Living will Healthcare Power of [place];Living will Healthcare Power of [place];Living will Healthcare Power of [place];Living will  Does patient want to make changes to medical advance directive? No - Patient declined No - Patient declined  No - Patient declined No - Patient declined No - Patient declined  No - Patient declined  Copy of Healthcare Power of Attorney in Chart?   No - copy requested No - copy requested No - copy requested Yes Yes    Hospital Utilization Over the Past 12 Months: # of hospitalizations or ER visits: 0 # of surgeries: 0  Review of Systems    Patient reports that her overall health is unchanged compared to last year.  History obtained from chart review and the patient  Patient Reported Readings (BP, Pulse, CBG, Weight, etc) none  Pain Assessment Pain : No/denies pain     Current Medications & Allergies (verified) Allergies as of 03/05/2023       Reactions   Cymbalta [duloxetine Hcl] Diarrhea   Morphine Hives        Medication List        Accurate as of Mar 05, 2023  9:17 AM. If you have any questions, ask your nurse or doctor.          acyclovir ointment 5 % Commonly known as: ZOVIRAX Apply 1 application topically every 3 (three) hours.   alendronate 70 MG tablet Commonly known as: FOSAMAX TAKE 1 TABLET BY MOUTH EVERY 7 DAYS WITH A FULL GLASS OF WATER ON AN EMPTY STOMACH   dicyclomine 10 MG capsule Commonly known as: BENTYL TAKE 1 CAPSULE BY MOUTH THREE TIMES A DAY AS NEEDED FOR SPASMS   glucosamine-chondroitin 500-400 MG tablet Take 1 tablet by mouth 2 (two) times daily.   hydrOXYzine 25 MG capsule Commonly known as: VISTARIL Take 1 capsule (25 mg total) by mouth every 8 (eight) hours as  needed.   Magnesium 300 MG Caps 1 capsule with a meal   methocarbamol 750 MG tablet Commonly known as: ROBAXIN Take 1 tablet by mouth every 12 (twelve) hours.   omeprazole 20 MG capsule Commonly known as: PRILOSEC TAKE 1 CAPSULE BY MOUTH EVERY DAY   oxyCODONE-acetaminophen 10-325 MG tablet Commonly known as: PERCOCET Take 1 tablet by mouth every 8 (eight) hours as needed.   PROBIOTIC-10 PO See admin instructions.   Sentry Tabs See admin instructions.   sertraline  100 MG tablet Commonly known as: ZOLOFT TAKE 1 TABLET BY MOUTH EVERY DAY   traMADol 200 MG 24 hr tablet Commonly known as: ULTRAM-ER Take 200 mg by mouth daily.   traZODone 100 MG tablet Commonly known as: DESYREL TAKE 1 TABLET BY MOUTH EVERYDAY AT BEDTIME        History (reviewed): Past Medical History:  Diagnosis Date   Allergy    Anxiety June 2021   Still ongoing   Arthritis    Avulsion fracture of right talus 06/27/2017   Cataract    Chronic LBP    Lumbar DDD   Colitis, ischemic (HCC)    Depression    Endometriosis    GERD (gastroesophageal reflux disease)    Hematuria    Hyperlipidemia    IBS (irritable bowel syndrome)    Fr Hurrelbrink- on Miralax and Citrucel daily   Migraines    MVP (mitral valve prolapse)    Past Surgical History:  Procedure Laterality Date   ABDOMINAL HYSTERECTOMY  1991   w/ bilat oophorectomy for endometriosis   APPENDECTOMY     BREAST BIOPSY     ESOPHAGOGASTRODUODENOSCOPY  2006   EYE SURGERY  01-2021   Cataracts   fused 5th digit right hand Right    HEMILAMINOTOMY LUMBAR SPINE  11/18/1991   Dr. Lorain Childes   lapartomy  1995   OTHER SURGICAL HISTORY  1992, 1994   right breast biopsy- benign     SPINAL FUSION  08/18/1997   Dr. Dorita Fray at [facility], L4-5 post fusion with iliac crest bone graft   TONSILLECTOMY AND ADENOIDECTOMY     Family History  Problem Relation Age of Onset   Alzheimer's disease Mother    Other Father        ACS? - 60's   Depression Father    Ataxia Sister    Social History   Socioeconomic History   Marital status: Widowed    Spouse name: [name]   Number of children: 2   Years of education: 12   Highest education level: 12th grade  Occupational History   Occupation: retired    Comment: Print production planner for city of [employer]  Tobacco Use   Smoking status: Former    Packs/day: 1.50    Years: 10.00    Additional pack years: 0.00    Total pack years: 15.00    Types: Cigarettes    Quit date:  10/23/1980    Years since quitting: 42.3   Smokeless tobacco: Never   Tobacco comments:    Quit 1982  Vaping Use   Vaping Use: Never used  Substance and Sexual Activity   Alcohol use: No   Drug use: No   Sexual activity: Not Currently    Birth control/protection: Abstinence, Surgical, None  Other Topics Concern   Not on file  Social History Narrative   Her husband passed away in 10-16-2020. She is getting better at taking care of herself as she had to take care of  her husband for the past 2.5 years. She lives alone with her dog. Walk occ. Her family lives close by. She enjoys crocheting.    Social Determinants of Health   Financial Resource Strain: Low Risk  (03/01/2023)   Overall Financial Resource Strain (CARDIA)    Difficulty of Paying Living Expenses: Not hard at all  Food Insecurity: No Food Insecurity (03/01/2023)   Hunger Vital Sign    Worried About Running Out of Food in the Last Year: Never true    Ran Out of Food in the Last Year: Never true  Transportation Needs: No Transportation Needs (03/01/2023)   PRAPARE - Administrator, Civil Service (Medical): No    Lack of Transportation (Non-Medical): No  Physical Activity: Inactive (03/01/2023)   Exercise Vital Sign    Days of Exercise per Week: 0 days    Minutes of Exercise per Session: 0 min  Stress: No Stress Concern Present (03/01/2023)   [employer] of Occupational Health - Occupational Stress Questionnaire    Feeling of Stress : Not at all  Social Connections: Moderately Isolated (03/05/2023)   Social Connection and Isolation Panel [NHANES]    Frequency of Communication with Friends and Family: More than three times a week    Frequency of Social Gatherings with Friends and Family: Twice a week    Attends Religious Services: More than 4 times per year    Active Member of [employer] or Organizations: No    Attends Banker Meetings: Never    Marital Status: Widowed    Activities of Daily  Living    03/01/2023    8:47 AM  In your present state of health, do you have any difficulty performing the following activities:  Hearing? 0  Vision? 0  Difficulty concentrating or making decisions? 0  Walking or climbing stairs? 0  Dressing or bathing? 0  Doing errands, shopping? 0  Preparing Food and eating ? N  Using the Toilet? N  In the past six months, have you accidently leaked urine? N  Do you have problems with loss of bowel control? N  Managing your Medications? N  Managing your Finances? N  Housekeeping or managing your Housekeeping? N    Patient Education/ Literacy How often do you need to have someone help you when you read instructions, pamphlets, or other written materials from your doctor or pharmacy?: 1 - Never What is the last grade level you completed in school?: 12th grade  Exercise Current Exercise Habits: The patient does not participate in regular exercise at present, Exercise limited by: orthopedic condition(s)  Diet Patient reports consuming  1-2  meals a day and 1-2 snack(s) a day Patient reports that her primary diet is: Regular Patient reports that she does have regular access to food.   Depression Screen    03/05/2023    9:09 AM 02/06/2023    3:09 PM 11/23/2022    2:48 PM 06/05/2022   12:50 PM 05/02/2022    2:13 PM 05/02/2022    2:04 PM 02/27/2022   11:07 AM  PHQ 2/9 Scores  PHQ - 2 Score 0 0 4    5  PHQ- 9 Score  0 10    12     Information is confidential and restricted. Go to Review Flowsheets to unlock data.     Fall Risk    03/05/2023    9:13 AM 03/01/2023    8:47 AM 02/06/2023    2:31 PM 11/23/2022    2:47  PM 02/23/2022    9:02 AM  Fall Risk   Falls in the past year? 0 0 1 1 1   Number falls in past yr: 0 0 0 0 0  Injury with Fall? 1 0 1 0 1  Risk for fall due to : History of fall(s)  History of fall(s) History of fall(s)   Follow up Falls evaluation completed;Education provided;Falls prevention discussed  Falls evaluation completed;Falls  prevention discussed Falls evaluation completed      Objective:  ADELI GONNERING seemed alert and oriented and she participated appropriately during our telephone visit.  Blood Pressure Weight BMI  BP Readings from Last 3 Encounters:  02/06/23 124/62  11/23/22 135/71  08/30/22 (!) 143/70   Wt Readings from Last 3 Encounters:  02/06/23 147 lb (66.7 kg)  11/23/22 143 lb (64.9 kg)  08/30/22 145 lb (65.8 kg)   BMI Readings from Last 1 Encounters:  02/06/23 23.73 kg/m    *Unable to obtain current vital signs, weight, and BMI due to telephone visit type  Hearing/Vision  Arissa did not seem to have difficulty with hearing/understanding during the telephone conversation Reports that she has not had a formal eye exam by an eye care professional within the past year Reports that she has not had a formal hearing evaluation within the past year *Unable to fully assess hearing and vision during telephone visit type  Cognitive Function:    03/05/2023    9:14 AM 02/27/2022   11:05 AM 11/29/2020   11:32 AM 11/25/2019   11:14 AM 11/19/2018   11:24 AM  6CIT Screen  What Year? 0 points 0 points 0 points 0 points 0 points  What month? 0 points 0 points 0 points 0 points 0 points  What time? 0 points 0 points 0 points 0 points 0 points  Count back from 20 0 points 0 points 0 points 0 points 0 points  Months in reverse 0 points 0 points 0 points 0 points 0 points  Repeat phrase 0 points 0 points 0 points 0 points 0 points  Total Score 0 points 0 points 0 points 0 points 0 points   (Normal:0-7, Significant for Dysfunction: >8)  Normal Cognitive Function Screening: Yes   Immunization & Health Maintenance Record Immunization History  Administered Date(s) Administered   Fluad Quad(high Dose 65+) 06/13/2019, 08/10/2020, 07/26/2021   Influenza Split 08/30/2011, 08/07/2012   Influenza Whole 08/19/2008, 08/03/2010   Influenza, Seasonal, Injecte, Preservative Fre 07/22/2013   Influenza,inj,Quad  PF,6+ Mos 09/23/2014, 06/25/2015, 09/08/2016   Influenza-Unspecified 06/27/2017, 08/11/2018   PFIZER(Purple Top)SARS-COV-2 Vaccination 01/01/2020, 01/22/2020, 08/24/2020   Pneumococcal Conjugate-13 09/23/2014   Pneumococcal Polysaccharide-23 06/02/2009   Td 05/16/2006   Tdap 12/18/2016   Zoster Recombinat (Shingrix) 11/22/2018, 05/14/2019   Zoster, Live 05/30/2011    Health Maintenance  Topic Date Due   COVID-19 Vaccine (4 - 2023-24 season) 09/16/2023 (Originally 06/23/2022)   Hepatitis C Screening  03/04/2024 (Originally 07/16/1959)   INFLUENZA VACCINE  05/24/2023   Medicare Annual Wellness (AWV)  03/04/2024   DTaP/Tdap/Td (3 - Td or Tdap) 12/18/2026   Pneumonia Vaccine 65+ Years old  Completed   DEXA SCAN  Completed   Zoster Vaccines- Shingrix  Completed   HPV VACCINES  Aged Out   COLONOSCOPY (Pts 45-110yrs Insurance coverage will need to be confirmed)  Discontinued       Assessment  This is a routine wellness examination for [employer].  Health Maintenance: Due or Overdue There are no preventive care reminders to  display for this patient.   Carolyn Hendrix does not need a referral for Community Assistance: Care Management:   no Social Work:    no Prescription Assistance:  no Nutrition/Diabetes Education:  no   Plan:  Personalized Goals  Goals Addressed               This Visit's Progress     Patient Stated (pt-stated)        Patient would like to be able to walk better and be pain free.       Personalized Health Maintenance & Screening Recommendations  Bone densitometry screening  Lung Cancer Screening Recommended: no (Low Dose CT Chest recommended if Age 36-80 years, 20 pack-year currently smoking OR have quit w/in past 15 years) Hepatitis C Screening recommended: yes HIV Screening recommended: no  Advanced Directives: Written information was not prepared per patient's request.  Referrals & Orders Orders Placed This Encounter  Procedures    DEXAScan    Follow-up Plan Follow-up with Agapito Games, MD as planned Medicare wellness visit in one year.  Patient will access AVS on my chart.   I have personally reviewed and noted the following in the patient's chart:   Medical and social history Use of alcohol, tobacco or illicit drugs  Current medications and supplements Functional ability and status Nutritional status Physical activity Advanced directives List of other physicians Hospitalizations, surgeries, and ER visits in previous 12 months Vitals Screenings to include cognitive, depression, and falls Referrals and appointments  In addition, I have reviewed and discussed with Carolyn Hendrix certain preventive protocols, quality metrics, and best practice recommendations. A written personalized care plan for preventive services as well as general preventive health recommendations is available and can be mailed to the patient at her request.      Modesto Charon, RN BSN  03/05/2023

## 2023-03-05 NOTE — Patient Instructions (Signed)
MEDICARE ANNUAL WELLNESS VISIT Health Maintenance Summary and Written Plan of Care  [name] ,  Thank you for allowing me to perform your Medicare Annual Wellness Visit and for your ongoing commitment to your health.   Health Maintenance & Immunization History Health Maintenance  Topic Date Due   COVID-19 Vaccine (4 - 2023-24 season) 09/16/2023 (Originally 06/23/2022)   Hepatitis C Screening  03/04/2024 (Originally 07/16/1959)   INFLUENZA VACCINE  05/24/2023   Medicare Annual Wellness (AWV)  03/04/2024   DTaP/Tdap/Td (3 - Td or Tdap) 12/18/2026   Pneumonia Vaccine 67+ Years old  Completed   DEXA SCAN  Completed   Zoster Vaccines- Shingrix  Completed   HPV VACCINES  Aged Out   COLONOSCOPY (Pts 45-30yrs Insurance coverage will need to be confirmed)  Discontinued   Immunization History  Administered Date(s) Administered   Fluad Quad(high Dose 65+) 06/13/2019, 08/10/2020, 07/26/2021   Influenza Split 08/30/2011, 08/07/2012   Influenza Whole 08/19/2008, 08/03/2010   Influenza, Seasonal, Injecte, Preservative Fre 07/22/2013   Influenza,inj,Quad PF,6+ Mos 09/23/2014, 06/25/2015, 09/08/2016   Influenza-Unspecified 06/27/2017, 08/11/2018   PFIZER(Purple Top)SARS-COV-2 Vaccination 01/01/2020, 01/22/2020, 08/24/2020   Pneumococcal Conjugate-13 09/23/2014   Pneumococcal Polysaccharide-23 06/02/2009   Td 05/16/2006   Tdap 12/18/2016   Zoster Recombinat (Shingrix) 11/22/2018, 05/14/2019   Zoster, Live 05/30/2011    These are the patient goals that we discussed:  Goals Addressed               This Visit's Progress     Patient Stated (pt-stated)        Patient would like to be able to walk better and be pain free.         This is a list of Health Maintenance Items that are overdue or due now: Bone densitometry screening    Orders/Referrals Placed Today: Orders Placed This Encounter  Procedures   DEXAScan    Standing Status:   Future    Standing Expiration Date:    03/04/2024    Scheduling Instructions:     Please call patient to schedule    Order Specific Question:   Reason for exam:    Answer:   post menopausal    Order Specific Question:   Preferred imaging location?    Answer:   [facility]   (Contact our referral department at [phone] if you have not spoken with someone about your referral appointment within the next 5 days)    Follow-up Plan Follow-up with Agapito Games, MD as planned Medicare wellness visit in one year.  Patient will access AVS on my chart.      Health Maintenance, Female Adopting a healthy lifestyle and getting preventive care are important in promoting health and wellness. Ask your health care provider about: The right schedule for you to have regular tests and exams. Things you can do on your own to prevent diseases and keep yourself healthy. What should I know about diet, weight, and exercise? Eat a healthy diet  Eat a diet that includes plenty of vegetables, fruits, low-fat dairy products, and lean protein. Do not eat a lot of foods that are high in solid fats, added sugars, or sodium. Maintain a healthy weight Body mass index (BMI) is used to identify weight problems. It estimates body fat based on height and weight. Your health care provider can help determine your BMI and help you achieve or maintain a healthy weight. Get regular exercise Get regular exercise. This is one of the most important things you can  do for your health. Most adults should: Exercise for at least 150 minutes each week. The exercise should increase your heart rate and make you sweat (moderate-intensity exercise). Do strengthening exercises at least twice a week. This is in addition to the moderate-intensity exercise. Spend less time sitting. Even light physical activity can be beneficial. Watch cholesterol and blood lipids Have your blood tested for lipids and cholesterol at 80 years of age, then have this test  every 5 years. Have your cholesterol levels checked more often if: Your lipid or cholesterol levels are high. You are older than 80 years of age. You are at high risk for heart disease. What should I know about cancer screening? Depending on your health history and family history, you may need to have cancer screening at various ages. This may include screening for: Breast cancer. Cervical cancer. Colorectal cancer. Skin cancer. Lung cancer. What should I know about heart disease, diabetes, and high blood pressure? Blood pressure and heart disease High blood pressure causes heart disease and increases the risk of stroke. This is more likely to develop in people who have high blood pressure readings or are overweight. Have your blood pressure checked: Every 3-5 years if you are 24-45 years of age. Every year if you are 48 years old or older. Diabetes Have regular diabetes screenings. This checks your fasting blood sugar level. Have the screening done: Once every three years after age 77 if you are at a normal weight and have a low risk for diabetes. More often and at a younger age if you are overweight or have a high risk for diabetes. What should I know about preventing infection? Hepatitis B If you have a higher risk for hepatitis B, you should be screened for this virus. Talk with your health care provider to find out if you are at risk for hepatitis B infection. Hepatitis C Testing is recommended for: Everyone born from [birth year] through 1963. Anyone with known risk factors for hepatitis C. Sexually transmitted infections (STIs) Get screened for STIs, including gonorrhea and chlamydia, if: You are sexually active and are younger than 80 years of age. You are older than 81 years of age and your health care provider tells you that you are at risk for this type of infection. Your sexual activity has changed since you were last screened, and you are at increased risk for chlamydia or  gonorrhea. Ask your health care provider if you are at risk. Ask your health care provider about whether you are at high risk for HIV. Your health care provider may recommend a prescription medicine to help prevent HIV infection. If you choose to take medicine to prevent HIV, you should first get tested for HIV. You should then be tested every 3 months for as long as you are taking the medicine. Pregnancy If you are about to stop having your period (premenopausal) and you may become pregnant, seek counseling before you get pregnant. Take 400 to 800 micrograms (mcg) of folic acid every day if you become pregnant. Ask for birth control (contraception) if you want to prevent pregnancy. Osteoporosis and menopause Osteoporosis is a disease in which the bones lose minerals and strength with aging. This can result in bone fractures. If you are 68 years old or older, or if you are at risk for osteoporosis and fractures, ask your health care provider if you should: Be screened for bone loss. Take a calcium or vitamin D supplement to lower your risk of fractures. Be given  hormone replacement therapy (HRT) to treat symptoms of menopause. Follow these instructions at home: Alcohol use Do not drink alcohol if: Your health care provider tells you not to drink. You are pregnant, may be pregnant, or are planning to become pregnant. If you drink alcohol: Limit how much you have to: 0-1 drink a day. Know how much alcohol is in your drink. In the [place] one drink equals one 12 oz bottle of beer (355 mL), one 5 oz glass of wine (148 mL), or one 1 oz glass of hard liquor (44 mL). Lifestyle Do not use any products that contain nicotine or tobacco. These products include cigarettes, chewing tobacco, and vaping devices, such as e-cigarettes. If you need help quitting, ask your health care provider. Do not use street drugs. Do not share needles. Ask your health care provider for help if you need support or  information about quitting drugs. General instructions Schedule regular health, dental, and eye exams. Stay current with your vaccines. Tell your health care provider if: You often feel depressed. You have ever been abused or do not feel safe at home. Summary Adopting a healthy lifestyle and getting preventive care are important in promoting health and wellness. Follow your health care provider's instructions about healthy diet, exercising, and getting tested or screened for diseases. Follow your health care provider's instructions on monitoring your cholesterol and blood pressure. This information is not intended to replace advice given to you by your health care provider. Make sure you discuss any questions you have with your health care provider. Document Revised: 02/28/2021 Document Reviewed: 02/28/2021 Elsevier Patient Education  2023 [employer].

## 2023-03-07 DIAGNOSIS — M461 Sacroiliitis, not elsewhere classified: Secondary | ICD-10-CM | POA: Diagnosis not present

## 2023-03-08 ENCOUNTER — Encounter: Payer: Self-pay | Admitting: Family Medicine

## 2023-03-12 NOTE — Telephone Encounter (Signed)
I called patient and she is going to drop off the permanent handicap form.

## 2023-03-24 ENCOUNTER — Other Ambulatory Visit: Payer: Self-pay | Admitting: Family Medicine

## 2023-03-27 ENCOUNTER — Encounter: Payer: Self-pay | Admitting: Family Medicine

## 2023-04-11 ENCOUNTER — Other Ambulatory Visit: Payer: Self-pay | Admitting: Family Medicine

## 2023-04-11 DIAGNOSIS — F5101 Primary insomnia: Secondary | ICD-10-CM

## 2023-04-12 ENCOUNTER — Ambulatory Visit: Payer: Medicare Other | Admitting: Sports Medicine

## 2023-04-12 DIAGNOSIS — M546 Pain in thoracic spine: Secondary | ICD-10-CM | POA: Diagnosis not present

## 2023-04-12 DIAGNOSIS — M5459 Other low back pain: Secondary | ICD-10-CM | POA: Diagnosis not present

## 2023-04-12 DIAGNOSIS — M48061 Spinal stenosis, lumbar region without neurogenic claudication: Secondary | ICD-10-CM | POA: Diagnosis not present

## 2023-04-12 DIAGNOSIS — M47814 Spondylosis without myelopathy or radiculopathy, thoracic region: Secondary | ICD-10-CM | POA: Diagnosis not present

## 2023-04-12 DIAGNOSIS — M47816 Spondylosis without myelopathy or radiculopathy, lumbar region: Secondary | ICD-10-CM | POA: Diagnosis not present

## 2023-04-12 DIAGNOSIS — Z981 Arthrodesis status: Secondary | ICD-10-CM | POA: Diagnosis not present

## 2023-04-12 DIAGNOSIS — M461 Sacroiliitis, not elsewhere classified: Secondary | ICD-10-CM | POA: Diagnosis not present

## 2023-04-12 DIAGNOSIS — M5136 Other intervertebral disc degeneration, lumbar region: Secondary | ICD-10-CM | POA: Diagnosis not present

## 2023-04-12 DIAGNOSIS — G894 Chronic pain syndrome: Secondary | ICD-10-CM | POA: Diagnosis not present

## 2023-04-19 ENCOUNTER — Other Ambulatory Visit: Payer: Self-pay | Admitting: Family Medicine

## 2023-04-27 ENCOUNTER — Ambulatory Visit (INDEPENDENT_AMBULATORY_CARE_PROVIDER_SITE_OTHER): Payer: Medicare Other | Admitting: Sports Medicine

## 2023-04-27 DIAGNOSIS — M75102 Unspecified rotator cuff tear or rupture of left shoulder, not specified as traumatic: Secondary | ICD-10-CM | POA: Diagnosis not present

## 2023-04-27 DIAGNOSIS — M12812 Other specific arthropathies, not elsewhere classified, left shoulder: Secondary | ICD-10-CM

## 2023-04-27 NOTE — Assessment & Plan Note (Signed)
Known rotator cuff tear arthropathy, last injection was about 2 and half months ago, she has recurrence of pain, she understands I have nothing else to offer her, she is taking narcotics with pain management provider, we have maximized injections, therapy, my recommendation is to discuss shoulder arthroplasty, she is not interested, we will see her as needed.

## 2023-04-27 NOTE — Progress Notes (Signed)
    Procedures performed today:    None.  Independent interpretation of notes and tests performed by another provider:   None.  Brief History, Exam, Impression, and Recommendations:    Rotator cuff tear arthropathy, left Known rotator cuff tear arthropathy, last injection was about 2 and half months ago, she has recurrence of pain, she understands I have nothing else to offer her, she is taking narcotics with pain management provider, we have maximized injections, therapy, my recommendation is to discuss shoulder arthroplasty, she is not interested, we will see her as needed.    ____________________________________________ Ihor Austin. Benjamin Stain, M.D., ABFM., CAQSM., AME. Primary Care and Sports Medicine [facility]  Adjunct Professor of Family Medicine  [place] [employer]  Restaurant manager, fast food

## 2023-05-15 DIAGNOSIS — M461 Sacroiliitis, not elsewhere classified: Secondary | ICD-10-CM | POA: Diagnosis not present

## 2023-05-21 DIAGNOSIS — M461 Sacroiliitis, not elsewhere classified: Secondary | ICD-10-CM | POA: Diagnosis not present

## 2023-05-24 ENCOUNTER — Other Ambulatory Visit: Payer: Self-pay | Admitting: Family Medicine

## 2023-05-30 ENCOUNTER — Ambulatory Visit (INDEPENDENT_AMBULATORY_CARE_PROVIDER_SITE_OTHER): Payer: Medicare Other

## 2023-05-30 DIAGNOSIS — Z0000 Encounter for general adult medical examination without abnormal findings: Secondary | ICD-10-CM

## 2023-05-30 DIAGNOSIS — Z780 Asymptomatic menopausal state: Secondary | ICD-10-CM | POA: Diagnosis not present

## 2023-05-30 DIAGNOSIS — M85851 Other specified disorders of bone density and structure, right thigh: Secondary | ICD-10-CM | POA: Diagnosis not present

## 2023-05-30 NOTE — Progress Notes (Signed)
Hi  Carolyn Hendrix, your bone density shows a T score of -1.1 which is mildly thin bones. Continue your Fosamax.    The current recommendation for osteopenia (mildly thin bones) treatment includes:   #1 calcium-total of 1200 mg of calcium daily.  If you eat a very calcium rich diet you may be able to obtain that without a supplement.  If not, then I recommend calcium 500 mg twice a day.  There are several products over-the-counter such as Caltrate D and Viactiv chews which are great options that contain calcium and vitamin D. #2 vitamin D-recommend 800 international units daily. #3 exercise-recommend 30 minutes of weightbearing exercise 3 days a week.  Resistance training ,such as doing bands and light weights, can be particularly helpful.

## 2023-06-08 ENCOUNTER — Ambulatory Visit (INDEPENDENT_AMBULATORY_CARE_PROVIDER_SITE_OTHER): Payer: Medicare Other | Admitting: Family Medicine

## 2023-06-08 ENCOUNTER — Encounter: Payer: Self-pay | Admitting: Family Medicine

## 2023-06-08 VITALS — BP 118/66 | HR 106 | Ht 66.0 in | Wt 147.0 lb

## 2023-06-08 DIAGNOSIS — F339 Major depressive disorder, recurrent, unspecified: Secondary | ICD-10-CM

## 2023-06-08 DIAGNOSIS — F5101 Primary insomnia: Secondary | ICD-10-CM | POA: Diagnosis not present

## 2023-06-08 DIAGNOSIS — R7989 Other specified abnormal findings of blood chemistry: Secondary | ICD-10-CM

## 2023-06-08 DIAGNOSIS — R251 Tremor, unspecified: Secondary | ICD-10-CM

## 2023-06-08 DIAGNOSIS — M810 Age-related osteoporosis without current pathological fracture: Secondary | ICD-10-CM | POA: Diagnosis not present

## 2023-06-08 DIAGNOSIS — F411 Generalized anxiety disorder: Secondary | ICD-10-CM | POA: Diagnosis not present

## 2023-06-08 DIAGNOSIS — E7800 Pure hypercholesterolemia, unspecified: Secondary | ICD-10-CM

## 2023-06-08 MED ORDER — TRAZODONE HCL 50 MG PO TABS
50.0000 mg | ORAL_TABLET | Freq: Every day | ORAL | 1 refills | Status: DC
Start: 2023-06-08 — End: 2023-11-05

## 2023-06-08 MED ORDER — ALENDRONATE SODIUM 70 MG PO TABS: 70.0000 mg | ORAL_TABLET | ORAL | 4 refills | Status: DC

## 2023-06-08 NOTE — Assessment & Plan Note (Signed)
Still gets an occasional physiologic tremor that bothers her she notices it mostly when she feels stressed or anxious.

## 2023-06-08 NOTE — Assessment & Plan Note (Signed)
Pelvic fracture in 2022.  On Fosamax. Check Vit D.

## 2023-06-08 NOTE — Assessment & Plan Note (Signed)
Due to recheck lipids. 

## 2023-06-08 NOTE — Assessment & Plan Note (Signed)
Will go ahead and decrease trazodone to 50 mg.  She is gena start with splitting her 100 mg tabs and I did send over an updated prescription.  I do not want her overly sedated and then feeling kind of sedated and groggy in the mornings.

## 2023-06-08 NOTE — Assessment & Plan Note (Addendum)
Okay overall she has had a little bit of DIP and low mood more recently but does not feel like we need to make any adjustments to her regimen.  Continue to follow.  Encouraged her to work on getting out of the house especially now that is getting just a little bit cooler as we get into fall.

## 2023-06-08 NOTE — Progress Notes (Signed)
Established Patient Office Visit  Subjective   Patient ID: Carolyn Hendrix, female    DOB: 17-Jan-1941  Age: 82 y.o. MRN: [id number]  No chief complaint on file.   HPI F/U MDD/Anxiety -she is felt a little bit more off lately.  She is not quite sure why she does feel like maybe is little bit harder to get motivated.  And get out of the house but she knows it would be good for her.  She does get out a little bit with her dog Bella.  F/U Insomnia -she says she has been taking the higher milligram trazodone as well as the Robaxin muscle relaxer and is been sleeping really hard.  She wonders if maybe she should decrease her sleep aid.  She did take a half a tab at 1 point in time and camber why she ended up going back up to a whole tab.  GERD - she is taking her PPI daily.       ROS    Objective:     BP 118/66   Pulse (!) 106   Ht 5\' 6"  (1.676 m)   Wt 147 lb (66.7 kg)   SpO2 96%   BMI 23.73 kg/m    Physical Exam Vitals and nursing note reviewed.  Constitutional:      Appearance: She is well-developed.  HENT:     Head: Normocephalic and atraumatic.  Cardiovascular:     Rate and Rhythm: Normal rate and regular rhythm.     Heart sounds: Normal heart sounds.  Pulmonary:     Effort: Pulmonary effort is normal.     Breath sounds: Normal breath sounds.  Skin:    General: Skin is warm and dry.  Neurological:     Mental Status: She is alert and oriented to person, place, and time.  Psychiatric:        Behavior: Behavior normal.      No results found for any visits on 06/08/23.    The 10-year ASCVD risk score (Arnett DK, et al., 2019) is: 19.6%    Assessment & Plan:   Problem List Items Addressed This Visit       Musculoskeletal and Integument   Osteoporosis    Pelvic fracture in 2022.  On Fosamax. Check Vit D.       Relevant Medications   alendronate (FOSAMAX) 70 MG tablet   Other Relevant Orders   VITAMIN D 25 Hydroxy (Vit-D Deficiency, Fractures)      Other   Tremor    Still gets an occasional physiologic tremor that bothers her she notices it mostly when she feels stressed or anxious.      Primary insomnia    Will go ahead and decrease trazodone to 50 mg.  She is gena start with splitting her 100 mg tabs and I did send over an updated prescription.  I do not want her overly sedated and then feeling kind of sedated and groggy in the mornings.      Relevant Medications   traZODone (DESYREL) 50 MG tablet   Major depression, recurrent, chronic (HCC) - Primary    Okay overall she has had a little bit of DIP and low mood more recently but does not feel like we need to make any adjustments to her regimen.  Continue to follow.  Encouraged her to work on getting out of the house especially now that is getting just a little bit cooler as we get into fall.      Relevant  Medications   traZODone (DESYREL) 50 MG tablet   Hyperlipidemia    Due to recheck lipids      Relevant Orders   Lipid panel   Other Visit Diagnoses     GAD (generalized anxiety disorder)       Relevant Medications   traZODone (DESYREL) 50 MG tablet   Elevated serum creatinine       Relevant Orders   CMP14+EGFR       Last labs showed an elevated serum creatinine we will plan to recheck that again today.  Return in about 4 months (around 10/08/2023) for Mood and sleep .    Nani Gasser, MD

## 2023-06-09 LAB — CMP14+EGFR
ALT: 18 IU/L (ref 0–32)
AST: 17 IU/L (ref 0–40)
Albumin: 4.2 g/dL (ref 3.8–4.8)
Alkaline Phosphatase: 88 IU/L (ref 44–121)
BUN/Creatinine Ratio: 29 — ABNORMAL HIGH (ref 12–28)
BUN: 26 mg/dL (ref 8–27)
Bilirubin Total: 0.3 mg/dL (ref 0.0–1.2)
CO2: 30 mmol/L — ABNORMAL HIGH (ref 20–29)
Calcium: 9.4 mg/dL (ref 8.7–10.3)
Chloride: 98 mmol/L (ref 96–106)
Creatinine, Ser: 0.91 mg/dL (ref 0.57–1.00)
Globulin, Total: 2.3 g/dL (ref 1.5–4.5)
Glucose: 92 mg/dL (ref 70–99)
Potassium: 4 mmol/L (ref 3.5–5.2)
Sodium: 142 mmol/L (ref 134–144)
Total Protein: 6.5 g/dL (ref 6.0–8.5)
eGFR: 64 mL/min/{1.73_m2} (ref >59)

## 2023-06-09 LAB — LIPID PANEL
Chol/HDL Ratio: 5.7 ratio — ABNORMAL HIGH (ref 0.0–4.4)
Cholesterol, Total: 270 mg/dL — ABNORMAL HIGH (ref 100–199)
HDL: 47 mg/dL (ref >39)
LDL Chol Calc (NIH): 184 mg/dL — ABNORMAL HIGH (ref 0–99)
Triglycerides: 204 mg/dL — ABNORMAL HIGH (ref 0–149)
VLDL Cholesterol Cal: 39 mg/dL (ref 5–40)

## 2023-06-09 LAB — VITAMIN D 25 HYDROXY (VIT D DEFICIENCY, FRACTURES): Vit D, 25-Hydroxy: 54.1 ng/mL (ref 30.0–100.0)

## 2023-06-10 ENCOUNTER — Encounter: Payer: Self-pay | Admitting: Family Medicine

## 2023-06-10 DIAGNOSIS — G629 Polyneuropathy, unspecified: Secondary | ICD-10-CM

## 2023-06-11 NOTE — Progress Notes (Signed)
Hi Carolyn Hendrix, metabolic panel overall looks good.  Kidney function is back down to normal.  Liver enzymes are normal.  Triglycerides are still little elevated but better than last year.  LDL is still very high.  Would still recommend a daily statin to reduce your risk for heart disease and stroke.  If you are okay with trying this please let me know.  Vitamin D looks great.

## 2023-06-11 NOTE — Telephone Encounter (Signed)
We did not check for B12 labs.  But I am happy to order those if she would like to come by in the next couple of weeks and have that drawn.  Orders Placed This Encounter  Procedures   B12

## 2023-06-17 DIAGNOSIS — Z6823 Body mass index (BMI) 23.0-23.9, adult: Secondary | ICD-10-CM | POA: Diagnosis not present

## 2023-06-17 DIAGNOSIS — N3001 Acute cystitis with hematuria: Secondary | ICD-10-CM | POA: Diagnosis not present

## 2023-06-27 DIAGNOSIS — M5136 Other intervertebral disc degeneration, lumbar region: Secondary | ICD-10-CM | POA: Diagnosis not present

## 2023-06-27 DIAGNOSIS — M48061 Spinal stenosis, lumbar region without neurogenic claudication: Secondary | ICD-10-CM | POA: Diagnosis not present

## 2023-07-10 ENCOUNTER — Other Ambulatory Visit: Payer: Self-pay | Admitting: Family Medicine

## 2023-07-19 ENCOUNTER — Other Ambulatory Visit (INDEPENDENT_AMBULATORY_CARE_PROVIDER_SITE_OTHER): Payer: Medicare Other

## 2023-07-19 ENCOUNTER — Ambulatory Visit (INDEPENDENT_AMBULATORY_CARE_PROVIDER_SITE_OTHER): Payer: Medicare Other | Admitting: Sports Medicine

## 2023-07-19 DIAGNOSIS — M12812 Other specific arthropathies, not elsewhere classified, left shoulder: Secondary | ICD-10-CM

## 2023-07-19 DIAGNOSIS — M75102 Unspecified rotator cuff tear or rupture of left shoulder, not specified as traumatic: Secondary | ICD-10-CM

## 2023-07-19 MED ORDER — TRIAMCINOLONE ACETONIDE 40 MG/ML IJ SUSP
40.0000 mg | Freq: Once | INTRAMUSCULAR | Status: AC
Start: 2023-07-19 — End: 2023-07-19
  Administered 2023-07-19: 40 mg via INTRAMUSCULAR

## 2023-07-19 NOTE — Assessment & Plan Note (Signed)
Known rotator cuff tear arthropathy, last injection was about 5 months ago, repeat left glenohumeral joint injection today. She does get narcotics from PCP, I have recommended shoulder arthroplasty in the past.

## 2023-07-19 NOTE — Addendum Note (Signed)
Addended by: Carren Rang A on: 07/19/2023 04:01 PM   Modules accepted: Orders

## 2023-07-19 NOTE — Progress Notes (Signed)
    Procedures performed today:    Procedure: Real-time Ultrasound Guided injection of the left glenohumeral joint Device: Samsung HS60  Verbal informed consent obtained.  Time-out conducted.  Noted no overlying erythema, induration, or other signs of local infection.  Skin prepped in a sterile fashion.  Local anesthesia: Topical Ethyl chloride.  With sterile technique and under real time ultrasound guidance: Arthritic joint noted, 1 cc Kenalog 40, 2 cc lidocaine, 2 cc bupivacaine injected easily Completed without difficulty  Advised to call if fevers/chills, erythema, induration, drainage, or persistent bleeding.  Images permanently stored and available for review in PACS.  Impression: Technically successful ultrasound guided injection.  Independent interpretation of notes and tests performed by another provider:   None.  Brief History, Exam, Impression, and Recommendations:    Rotator cuff tear arthropathy, left Known rotator cuff tear arthropathy, last injection was about 5 months ago, repeat left glenohumeral joint injection today. She does get narcotics from PCP, I have recommended shoulder arthroplasty in the past.    ____________________________________________ Ihor Austin. Benjamin Stain, M.D., ABFM., CAQSM., AME. Primary Care and Sports Medicine [facility]  Adjunct Professor of Family Medicine  [place] [employer] [facility] [employer] [facility] [employer]  Restaurant manager, fast food

## 2023-07-25 DIAGNOSIS — N390 Urinary tract infection, site not specified: Secondary | ICD-10-CM | POA: Diagnosis not present

## 2023-07-25 DIAGNOSIS — Z681 Body mass index (BMI) 19 or less, adult: Secondary | ICD-10-CM | POA: Diagnosis not present

## 2023-08-08 DIAGNOSIS — M48061 Spinal stenosis, lumbar region without neurogenic claudication: Secondary | ICD-10-CM | POA: Diagnosis not present

## 2023-08-08 DIAGNOSIS — G894 Chronic pain syndrome: Secondary | ICD-10-CM | POA: Diagnosis not present

## 2023-08-08 DIAGNOSIS — M47816 Spondylosis without myelopathy or radiculopathy, lumbar region: Secondary | ICD-10-CM | POA: Diagnosis not present

## 2023-08-15 DIAGNOSIS — N390 Urinary tract infection, site not specified: Secondary | ICD-10-CM | POA: Diagnosis not present

## 2023-08-15 DIAGNOSIS — Z681 Body mass index (BMI) 19 or less, adult: Secondary | ICD-10-CM | POA: Diagnosis not present

## 2023-08-17 ENCOUNTER — Encounter: Payer: Self-pay | Admitting: Family Medicine

## 2023-08-17 ENCOUNTER — Telehealth (INDEPENDENT_AMBULATORY_CARE_PROVIDER_SITE_OTHER): Payer: Medicare Other | Admitting: Family Medicine

## 2023-08-17 VITALS — Wt 147.0 lb

## 2023-08-17 DIAGNOSIS — N390 Urinary tract infection, site not specified: Secondary | ICD-10-CM

## 2023-08-17 NOTE — Progress Notes (Signed)
    Virtual Visit via Video Note  I connected with Carolyn Hendrix on 08/17/23 at  3:20 PM EDT by a video enabled telemedicine application and verified that I am speaking with the correct person using two identifiers.   I discussed the limitations of evaluation and management by telemedicine and the availability of in person appointments. The patient expressed understanding and agreed to proceed.  Patient location: at home Provider location: in office  Subjective:    CC:   Chief Complaint  Patient presents with   Urinary Tract Infection    Pt has had recurring UTI's since Aug. [facility] her to [facility]CP was told she has had too many in a row.     HPI: [facility] was [facility] at [facility] for UTIs in August, August 2, and then again this past week on Wednesday.  They recommended that she follow-up with her PCP since she has had 3 to UTIs in the past 3 months.  She says she has been trying to drink a little bit more water she usually gets low back pain with her UTIs she does not always get dysuria or hematuria.   Past medical history, Surgical history, Family history not pertinant except as noted below, Social history, Allergies, and medications have been entered into the medical record, reviewed, and corrections made.    Objective:    General: Speaking clearly in complete sentences without any shortness of breath.  Alert and oriented x3.  Normal judgment. No apparent acute distress.    Impression and Recommendations:    Problem List Items Addressed This Visit   None Visit Diagnoses     Recurrent UTI    -  Primary   Relevant Orders   Urine Culture       Recurrent UTI-we did discuss making sure that the current infection is completely clear especially since she really only had about 1 week between the last 2 UTIs.  She should finish her antibiotic next Tuesday she was put on it for 7 days.  When she has been off for about 5 to 7 days want a repeat a urine culture  to make sure it is clear.  Once it has fully cleared then we will does start prophylaxis.  We discussed options of using a low-dose antibiotic versus a topical estrogen cream.  I think she is more interested in the antibiotic option at this point.  Orders Placed This Encounter  Procedures   Urine Culture    No orders of the defined types were placed in this encounter.   I discussed the assessment and treatment plan with the patient. The patient was provided an opportunity to ask questions and all were answered. The patient agreed with the plan and demonstrated an understanding of the instructions.   The patient was advised to call back or seek an in-person evaluation if the symptoms worsen or if the condition fails to improve as anticipated.   Nani Gasser, MD

## 2023-08-22 ENCOUNTER — Other Ambulatory Visit: Payer: Self-pay | Admitting: Family Medicine

## 2023-08-30 DIAGNOSIS — N390 Urinary tract infection, site not specified: Secondary | ICD-10-CM | POA: Diagnosis not present

## 2023-09-01 ENCOUNTER — Telehealth: Payer: Medicare Other | Admitting: Physician Assistant

## 2023-09-01 DIAGNOSIS — N390 Urinary tract infection, site not specified: Secondary | ICD-10-CM

## 2023-09-01 DIAGNOSIS — R109 Unspecified abdominal pain: Secondary | ICD-10-CM

## 2023-09-01 DIAGNOSIS — M549 Dorsalgia, unspecified: Secondary | ICD-10-CM

## 2023-09-01 NOTE — Progress Notes (Signed)
I have spent 5 minutes in review of e-visit questionnaire, review and updating patient chart, medical decision making and response to patient.   Laure Kidney, PA-C

## 2023-09-01 NOTE — Progress Notes (Signed)
Based on what you shared with me, I feel your condition warrants further evaluation as soon as possible at an Emergency department.    NOTE: There will be NO CHARGE for this eVisit   If you are having a true medical emergency please call 911.      Emergency Department-[facility]  Get Driving Directions  [phone]  [address]  [place], [place] [address]  Open 24/7/365      [place] [facility] [address]  Get Driving Directions  [address]  [place], [place] [address]  Open 24/7/365    Emergency Department- [facility]  Get Driving Directions  [phone]  [address]  [place], [place] [address]  Open 24/7/365      [facility]  Get Driving Directions  [phone]  [address]  [place], [place] [address]  Open 24/7/365    [place]  Emergency Department- [facility]  Get Driving Directions  [phone]  [address]  [place], [place] [address]  Open 24/7/365    [place]  Emergency Department- [facility] [place]  Get Driving Directions  [address]  [place], [place] [address]  Open 24/7/365    [place]  Emergency Department- [facility]  Get Driving Directions  [phone]  [address]  [place], [place] [address]  Open 24/7/365    

## 2023-09-03 LAB — URINE CULTURE

## 2023-09-03 MED ORDER — SULFAMETHOXAZOLE-TRIMETHOPRIM 800-160 MG PO TABS: 1.0000 | ORAL_TABLET | Freq: Two times a day (BID) | ORAL | 0 refills | Status: DC

## 2023-09-03 NOTE — Addendum Note (Signed)
Addended by: Nani Gasser D on: 09/03/2023 07:49 PM   Modules accepted: Orders

## 2023-09-03 NOTE — Progress Notes (Signed)
HI Carolyn Hendrix your urine culture is positive for E coli. Sent over antibiotic, Bactrim DS to pharmacy. Repeat urine culture in 5 days after copleted antibiotic.

## 2023-09-05 ENCOUNTER — Other Ambulatory Visit: Payer: Self-pay | Admitting: *Deleted

## 2023-09-09 ENCOUNTER — Encounter: Payer: Self-pay | Admitting: Family Medicine

## 2023-09-09 DIAGNOSIS — E7800 Pure hypercholesterolemia, unspecified: Secondary | ICD-10-CM

## 2023-09-10 ENCOUNTER — Encounter: Payer: Self-pay | Admitting: Family Medicine

## 2023-09-10 DIAGNOSIS — R8290 Unspecified abnormal findings in urine: Secondary | ICD-10-CM

## 2023-09-10 DIAGNOSIS — N390 Urinary tract infection, site not specified: Secondary | ICD-10-CM

## 2023-09-10 MED ORDER — PRAVASTATIN SODIUM 40 MG PO TABS: 40.0000 mg | ORAL_TABLET | Freq: Every day | ORAL | 3 refills | Status: DC

## 2023-09-11 ENCOUNTER — Ambulatory Visit (INDEPENDENT_AMBULATORY_CARE_PROVIDER_SITE_OTHER): Payer: Medicare Other | Admitting: Family Medicine

## 2023-09-11 VITALS — BP 136/75 | HR 91 | Temp 97.9°F

## 2023-09-11 DIAGNOSIS — G8929 Other chronic pain: Secondary | ICD-10-CM | POA: Diagnosis not present

## 2023-09-11 DIAGNOSIS — R102 Pelvic and perineal pain: Secondary | ICD-10-CM

## 2023-09-11 DIAGNOSIS — M545 Low back pain, unspecified: Secondary | ICD-10-CM

## 2023-09-11 DIAGNOSIS — N390 Urinary tract infection, site not specified: Secondary | ICD-10-CM | POA: Diagnosis not present

## 2023-09-11 DIAGNOSIS — R8290 Unspecified abnormal findings in urine: Secondary | ICD-10-CM | POA: Diagnosis not present

## 2023-09-11 MED ORDER — SULFAMETHOXAZOLE-TRIMETHOPRIM 800-160 MG PO TABS: 1.0000 | ORAL_TABLET | Freq: Two times a day (BID) | ORAL | 0 refills | Status: DC

## 2023-09-11 NOTE — Telephone Encounter (Signed)
Patient called and states she is on the way.

## 2023-09-11 NOTE — Telephone Encounter (Signed)
Orders Placed This Encounter  ?Procedures  ? Urine Culture  ?  ?

## 2023-09-11 NOTE — Progress Notes (Signed)
Still symptomatic we will go ahead and place her on Bactrim she does not want to wait for 3 days for the culture to come back this visit was originally planned is just a follow-up to make sure that the current UTI had been cleared before we put her on prophylaxis for recurrent UTIs.  If culture is still positive this time then we will place referral to urology.  Meds ordered this encounter  Medications   sulfamethoxazole-trimethoprim (BACTRIM DS) 800-160 MG tablet    Sig: Take 1 tablet by mouth 2 (two) times daily.    Dispense:  6 tablet    Refill:  0

## 2023-09-11 NOTE — Progress Notes (Signed)
   Established Patient Office Visit  Subjective   Patient ID: SIMA GENNETTE, female    DOB: 02/14/1941  Age: 82 y.o. MRN: [id number]  Chief Complaint  Patient presents with   Pelvic Pain   Back Pain    HPI  LIONEL EYERMAN complains of pelvic pressure and low back pain for a couple months. Patient reports recent antibiotic use. She has not had recent catheterization. She states she had a temperature of 99 one day last week. She took a home test for UTI and it came back positive for WBC. She reports 4 UTI's in the last few months. Patient has not taken Azo.    ROS    Objective:     BP 136/75   Pulse 91   Temp 97.9 F (36.6 C) (Oral)   SpO2 96%    Physical Exam   No results found for any visits on 09/11/23.    The ASCVD Risk score (Arnett DK, et al., 2019) failed to calculate for the following reasons:   The 2019 ASCVD risk score is only valid for ages 54 to 68    Assessment & Plan:     Problem List Items Addressed This Visit       Unprioritized   Low back pain - Primary   Other Visit Diagnoses     Pelvic pressure in female           Return if symptoms worsen or fail to improve.    Esmond Harps, CMA

## 2023-09-14 LAB — URINE CULTURE

## 2023-09-14 MED ORDER — NITROFURANTOIN MONOHYD MACRO 100 MG PO CAPS: 100.0000 mg | ORAL_CAPSULE | Freq: Two times a day (BID) | ORAL | 0 refills | Status: DC

## 2023-09-14 NOTE — Progress Notes (Signed)
HI Lalanya,  The urine culture did come back positive for different bacteria called Enterococcus faecalis.  This usually comes from stools.  We are going to need to switch her antibiotics so please stop the current 1 that you are taking and I am going to send over nitrofurantoin instead.

## 2023-09-25 DIAGNOSIS — Z79891 Long term (current) use of opiate analgesic: Secondary | ICD-10-CM | POA: Diagnosis not present

## 2023-09-25 DIAGNOSIS — M47816 Spondylosis without myelopathy or radiculopathy, lumbar region: Secondary | ICD-10-CM | POA: Diagnosis not present

## 2023-09-25 DIAGNOSIS — G894 Chronic pain syndrome: Secondary | ICD-10-CM | POA: Diagnosis not present

## 2023-09-25 DIAGNOSIS — M461 Sacroiliitis, not elsewhere classified: Secondary | ICD-10-CM | POA: Diagnosis not present

## 2023-10-06 ENCOUNTER — Other Ambulatory Visit: Payer: Self-pay | Admitting: Family Medicine

## 2023-10-08 ENCOUNTER — Ambulatory Visit (INDEPENDENT_AMBULATORY_CARE_PROVIDER_SITE_OTHER): Payer: Medicare Other | Admitting: Family Medicine

## 2023-10-08 ENCOUNTER — Encounter: Payer: Self-pay | Admitting: Family Medicine

## 2023-10-08 VITALS — BP 135/69 | HR 92 | Ht 66.0 in | Wt 148.0 lb

## 2023-10-08 DIAGNOSIS — M47897 Other spondylosis, lumbosacral region: Secondary | ICD-10-CM | POA: Diagnosis not present

## 2023-10-08 DIAGNOSIS — N390 Urinary tract infection, site not specified: Secondary | ICD-10-CM | POA: Diagnosis not present

## 2023-10-08 DIAGNOSIS — F339 Major depressive disorder, recurrent, unspecified: Secondary | ICD-10-CM | POA: Diagnosis not present

## 2023-10-08 DIAGNOSIS — R350 Frequency of micturition: Secondary | ICD-10-CM | POA: Diagnosis not present

## 2023-10-08 DIAGNOSIS — N3000 Acute cystitis without hematuria: Secondary | ICD-10-CM

## 2023-10-08 DIAGNOSIS — F5101 Primary insomnia: Secondary | ICD-10-CM | POA: Diagnosis not present

## 2023-10-08 LAB — POCT URINALYSIS DIP (CLINITEK)
Bilirubin, UA: NEGATIVE
Glucose, UA: NEGATIVE mg/dL
Ketones, POC UA: NEGATIVE mg/dL
Nitrite, UA: POSITIVE — AB
POC PROTEIN,UA: 30 — AB
Spec Grav, UA: 1.02 (ref 1.010–1.025)
Urobilinogen, UA: 0.2 U/dL
pH, UA: 6 (ref 5.0–8.0)

## 2023-10-08 MED ORDER — NITROFURANTOIN MONOHYD MACRO 100 MG PO CAPS: 100.0000 mg | ORAL_CAPSULE | Freq: Two times a day (BID) | ORAL | 0 refills | Status: DC

## 2023-10-08 MED ORDER — SERTRALINE HCL 100 MG PO TABS: 150.0000 mg | ORAL_TABLET | Freq: Every day | ORAL | 1 refills | Status: DC

## 2023-10-08 MED ORDER — PREMARIN 0.625 MG/GM VA CREA: TOPICAL_CREAM | VAGINAL | 12 refills | Status: AC

## 2023-10-08 NOTE — Progress Notes (Signed)
Established Patient Office Visit  Subjective  Patient ID: Carolyn Hendrix, female    DOB: 10/05/1941  Age: 82 y.o. MRN: [id number]  Chief Complaint  Patient presents with   Depression    Depression f/u = requesting to change to 150mg  daily  sertraline   UTI  history    Patient requesting testing to see that UTI cleared .     HPI She repots she his having frequent urination for a couple of months. No dysuria or hematuria.  Did a home test.  Last treated in November for UTI>  Follow-up depression-she is currently on 150 mg of sertraline daily will need an updated and corrected prescription.  Follow-up insomnia-currently on trazodone at bedtime.  She is scheduled for bilateral SI joint injections in January.  Off of oxycodone now on Lyrica but not convinced is really helping with her back she does feel like its helped with her neuropathy symptoms in her feet at night.    ROS    Objective:     BP 135/69 (BP Location: Left Arm, Patient Position: Sitting, Cuff Size: Normal)   Pulse 92   Ht 5\' 6"  (1.676 m)   Wt 148 lb (67.1 kg)   SpO2 95%   BMI 23.89 kg/m    Physical Exam Vitals and nursing note reviewed.  Constitutional:      Appearance: Normal appearance.  HENT:     Head: Normocephalic and atraumatic.  Eyes:     Conjunctiva/sclera: Conjunctivae normal.  Cardiovascular:     Rate and Rhythm: Normal rate and regular rhythm.  Pulmonary:     Effort: Pulmonary effort is normal.     Breath sounds: Normal breath sounds.  Skin:    General: Skin is warm and dry.  Neurological:     Mental Status: She is alert.  Psychiatric:        Mood and Affect: Mood normal.      Results for orders placed or performed in visit on 10/08/23  POCT URINALYSIS DIP (CLINITEK)  Result Value Ref Range   Color, UA yellow yellow   Clarity, UA cloudy (A) clear   Glucose, UA negative negative mg/dL   Bilirubin, UA negative negative   Ketones, POC UA negative negative mg/dL   Spec Grav,  UA 4.098 1.010 - 1.025   Blood, UA moderate (A) negative   pH, UA 6.0 5.0 - 8.0   POC PROTEIN,UA =30 (A) negative, trace   Urobilinogen, UA 0.2 0.2 or 1.0 E.U./dL   Nitrite, UA Positive (A) Negative   Leukocytes, UA Moderate (2+) (A) Negative      The ASCVD Risk score (Arnett DK, et al., 2019) failed to calculate for the following reasons:   The 2019 ASCVD risk score is only valid for ages 34 to 65    Assessment & Plan:   Problem List Items Addressed This Visit       Musculoskeletal and Integument   Lumbosacral spondylosis   She follows with pain management they recently actually took her off of her oxycodone and they are trying her on Lyrica.  She says she is not really sure it is helping her back but she says it really has helped her neuropathy.        Other   Primary insomnia   Doing well with trazodone.      Major depression, recurrent, chronic (HCC)   With current regimen we will update her prescription.  Follow-up in 6 months.      Relevant Medications  sertraline (ZOLOFT) 100 MG tablet   Other Visit Diagnoses       Urinary frequency    -  Primary   Relevant Orders   POCT URINALYSIS DIP (CLINITEK) (Completed)   Urine Culture     Acute cystitis without hematuria         Recurrent UTI       Relevant Medications   nitrofurantoin, macrocrystal-monohydrate, (MACROBID) 100 MG capsule   conjugated estrogens (PREMARIN) vaginal cream       UTI-urinalysis is positive today for nitrites and blood and leukocytes.  Will treat with Macrobid and will send for culture will call with results once available.  She is still having some frequency but no dysuria or hematuria.  Recurrent UTI-we discussed starting a topical estrogen we discussed how it works she is open to trying it.  Prescription sent to pharmacy.  No follow-ups on file.    Nani Gasser, MD

## 2023-10-08 NOTE — Assessment & Plan Note (Signed)
With current regimen we will update her prescription.  Follow-up in 6 months.

## 2023-10-08 NOTE — Assessment & Plan Note (Signed)
She follows with pain management they recently actually took her off of her oxycodone and they are trying her on Lyrica.  She says she is not really sure it is helping her back but she says it really has helped her neuropathy.

## 2023-10-08 NOTE — Assessment & Plan Note (Signed)
Doing well with trazodone  

## 2023-10-10 LAB — URINE CULTURE

## 2023-10-11 NOTE — Progress Notes (Signed)
Carolyn Hendrix, urine culture came back positive for E. coli.  Antibiotic that I put you on was correct so just make sure to complete that course.  Hopefully you are able to pick up the topical estrogen cream.  Lease go ahead and start that if you have not already.

## 2023-10-26 DIAGNOSIS — M461 Sacroiliitis, not elsewhere classified: Secondary | ICD-10-CM | POA: Diagnosis not present

## 2023-10-29 ENCOUNTER — Ambulatory Visit (INDEPENDENT_AMBULATORY_CARE_PROVIDER_SITE_OTHER): Payer: Medicare Other | Admitting: Sports Medicine

## 2023-10-29 ENCOUNTER — Other Ambulatory Visit (INDEPENDENT_AMBULATORY_CARE_PROVIDER_SITE_OTHER): Payer: Medicare Other

## 2023-10-29 DIAGNOSIS — M75102 Unspecified rotator cuff tear or rupture of left shoulder, not specified as traumatic: Secondary | ICD-10-CM | POA: Diagnosis not present

## 2023-10-29 DIAGNOSIS — M12812 Other specific arthropathies, not elsewhere classified, left shoulder: Secondary | ICD-10-CM | POA: Diagnosis not present

## 2023-10-29 NOTE — Progress Notes (Signed)
    Procedures performed today:    Procedure: Real-time Ultrasound Guided injection of the left glenohumeral joint Device: Samsung HS60  Verbal informed consent obtained.  Time-out conducted.  Noted no overlying erythema, induration, or other signs of local infection.  Skin prepped in a sterile fashion.  Local anesthesia: Topical Ethyl chloride.  With sterile technique and under real time ultrasound guidance: Arthritic joint noted, 1 cc Kenalog  40, 2 cc lidocaine, 2 cc bupivacaine injected easily Completed without difficulty  Advised to call if fevers/chills, erythema, induration, drainage, or persistent bleeding.  Images permanently stored and available for review in PACS.  Impression: Technically successful ultrasound guided injection.  Independent interpretation of notes and tests performed by another provider:   None.  Brief History, Exam, Impression, and Recommendations:    Rotator cuff tear arthropathy, left Exquisitely pleasant 83 year old female with rotator cuff tear arthropathy, her last injection was approximately 3 and half months ago, left glenohumeral, repeated today. She does get narcotics from PCP. I have recommended shoulder arthroplasty in the past. Return to see me as needed.    ____________________________________________ Debby PARAS. Curtis, M.D., ABFM., CAQSM., AME. Primary Care and Sports Medicine [facility]  Adjunct Professor of [facility] Medicine  [employer]  Rest[employer] Manager, [employer]

## 2023-10-29 NOTE — Assessment & Plan Note (Signed)
 Exquisitely pleasant 83 year old female with rotator cuff tear arthropathy, her last injection was approximately 3 and half months ago, left glenohumeral, repeated today. She does get narcotics from PCP. I have recommended shoulder arthroplasty in the past. Return to see me as needed.

## 2023-10-30 DIAGNOSIS — M75102 Unspecified rotator cuff tear or rupture of left shoulder, not specified as traumatic: Secondary | ICD-10-CM | POA: Diagnosis not present

## 2023-10-30 DIAGNOSIS — M12812 Other specific arthropathies, not elsewhere classified, left shoulder: Secondary | ICD-10-CM | POA: Diagnosis not present

## 2023-10-30 MED ORDER — TRIAMCINOLONE ACETONIDE 40 MG/ML IJ SUSP
40.0000 mg | Freq: Once | INTRAMUSCULAR | Status: AC
  Administered 2023-10-30: 40 mg via INTRAMUSCULAR

## 2023-10-30 NOTE — Code Documentation (Signed)
 done

## 2023-10-30 NOTE — Addendum Note (Signed)
 Addended by: Carren Rang A on: 10/30/2023 10:21 AM   Modules accepted: Orders

## 2023-11-02 ENCOUNTER — Other Ambulatory Visit: Payer: Self-pay | Admitting: Family Medicine

## 2023-11-02 DIAGNOSIS — F5101 Primary insomnia: Secondary | ICD-10-CM

## 2023-11-25 ENCOUNTER — Other Ambulatory Visit: Payer: Self-pay | Admitting: Family Medicine

## 2023-11-25 DIAGNOSIS — F339 Major depressive disorder, recurrent, unspecified: Secondary | ICD-10-CM

## 2023-11-27 ENCOUNTER — Other Ambulatory Visit: Payer: Self-pay

## 2023-11-27 ENCOUNTER — Ambulatory Visit
Admission: RE | Admit: 2023-11-27 | Discharge: 2023-11-27 | Disposition: A | Discharge status: Home / Self Care | Payer: Medicare Other | Source: Ambulatory Visit | Attending: Family Medicine | Admitting: Family Medicine

## 2023-11-27 VITALS — BP 123/72 | HR 86 | Temp 98.2°F | Resp 17

## 2023-11-27 DIAGNOSIS — N3001 Acute cystitis with hematuria: Secondary | ICD-10-CM

## 2023-11-27 LAB — POCT URINALYSIS DIP (MANUAL ENTRY)
Bilirubin, UA: NEGATIVE
Glucose, UA: NEGATIVE mg/dL
Ketones, POC UA: NEGATIVE mg/dL
Nitrite, UA: POSITIVE — AB
Protein Ur, POC: NEGATIVE mg/dL
Spec Grav, UA: 1.015 (ref 1.010–1.025)
Urobilinogen, UA: 0.2 U/dL
pH, UA: 6.5 (ref 5.0–8.0)

## 2023-11-27 MED ORDER — CEFDINIR 300 MG PO CAPS: 300.0000 mg | ORAL_CAPSULE | Freq: Two times a day (BID) | ORAL | 0 refills | Status: AC

## 2023-11-27 NOTE — Discharge Instructions (Addendum)
 Patient to take medication as directed with food to completion.  Encouraged to increase daily water intake to 64 ounces per day while taking this medication.  Advised we will follow-up with urine culture results once received.  Advised if symptoms worsen and/or unresolved please follow-up with your PCP [facility] urology, or here for further evaluation.

## 2023-11-27 NOTE — ED Provider Notes (Signed)
 [facility]    CSN: [id number] Arrival date & time: 11/27/23  1247      History   Chief Complaint Chief Complaint  Patient presents with   Urinary Retention    APPT; possible UTI    HPI Carolyn Hendrix is a 83 y.o. female.   HPI  Past Medical History:  Diagnosis Date   Allergy    Anxiety June 2021   Still ongoing   Arthritis    Avulsion fracture of right talus 06/27/2017   Cataract    Chronic LBP    Lumbar DDD   Colitis, ischemic (HCC)    Depression    Endometriosis    GERD (gastroesophageal reflux disease)    Hematuria    Hyperlipidemia    IBS (irritable bowel syndrome)    Fr Hurrelbrink- on Miralax and Citrucel daily   Migraines    MVP (mitral valve prolapse)     Patient Active Problem List   Diagnosis Date Noted   Tremor 02/06/2023   Pelvic fracture (HCC) 05/12/2021   Low iron 02/22/2021   Palpitation 12/23/2020   Dysequilibrium 08/10/2020   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   Primary osteoarthritis of both hips 10/08/2019   Pseudogout of knee 06/10/2019   Osteoporosis 12/04/2018   Rotator cuff tear arthropathy, left 05/11/2017   Hyperlipidemia 11/10/2016   S/P insertion of spinal cord stimulator 02/23/2016   Segmental and somatic dysfunction of lumbar region 06/11/2015   Gluten intolerance 09/23/2013   Postlaminectomy syndrome, not elsewhere classified 09/03/2013   Low back pain 03/29/2013   Lumbosacral spondylosis 03/29/2013   Disorder of sacrum 03/29/2013   Chronic pain 03/29/2013   Constipation 03/29/2013   History of mallet deformity correction, possible osteomyelitis 03/20/2013   Primary insomnia 03/18/2013   OSTEOARTHRITIS, HIP, RIGHT 02/14/2010   Osteoarthritis 02/14/2010   Anemia 06/02/2009   Memory impairment 04/21/2009   IRRITABLE BOWEL SYNDROME 06/02/2008   Malaise and fatigue 06/02/2008   GERD 03/30/2008   Diverticulosis of colon 03/30/2008   Major depression, recurrent, chronic (HCC) 02/17/2008   Endometriosis  02/17/2008   DISC DISEASE, LUMBAR 02/17/2008   ISCHEMIC COLITIS, HX OF 02/17/2008    Past Surgical History:  Procedure Laterality Date   ABDOMINAL HYSTERECTOMY  1991   w/ bilat oophorectomy for endometriosis   APPENDECTOMY     BREAST BIOPSY     ESOPHAGOGASTRODUODENOSCOPY  2006   EYE SURGERY  01-2021   Cataracts   fused 5th digit right hand Right    HEMILAMINOTOMY LUMBAR SPINE  11/18/1991   Dr. Elsie Nettle   lapartomy  1995   OTHER SURGICAL HISTORY  1992, 1994   right breast biopsy- benign     SPINAL FUSION  08/18/1997   Dr. Willma Harari at [facility], L4-5 post fusion with iliac crest bone graft   TONSILLECTOMY AND ADENOIDECTOMY      OB History   No obstetric history on file.      Home Medications    Prior to Admission medications   Medication Sig Start Date End Date Taking? Authorizing Provider  cefdinir  (OMNICEF ) 300 MG capsule Take 1 capsule (300 mg total) by mouth 2 (two) times daily for 7 days. 11/27/23 12/04/23 Yes Sanoe Hazan, FNP  acyclovir  ointment (ZOVIRAX ) 5 % Apply 1 application topically every 3 (three) hours. 06/21/20   Alvan Dorothyann BIRCH, MD  alendronate  (FOSAMAX ) 70 MG tablet Take 1 tablet (70 mg total) by mouth once a week. Take with a full glass of water on an empty stomach. 06/08/23  Alvan Dorothyann BIRCH, MD  conjugated estrogens  (PREMARIN ) vaginal cream Pea sized amount on the urethral area nightly x 2 weeks and then 3 x a week. 10/08/23   Alvan Dorothyann BIRCH, MD  dicyclomine  (BENTYL ) 10 MG capsule TAKE 1 CAPSULE BY MOUTH THREE TIMES A DAY AS NEEDED FOR SPASMS 10/12/23   Alvan Dorothyann BIRCH, MD  glucosamine-chondroitin 500-400 MG tablet Take 1 tablet by mouth 2 (two) times daily.    [provider]  Magnesium 300 MG CAPS 1 capsule with a meal    [provider]  methocarbamol (ROBAXIN) 750 MG tablet Take 1 tablet by mouth every 12 (twelve) hours. 02/03/21   Berline Mile, MD  Multiple Vitamins-Minerals (SENTRY) TABS See admin  instructions.    [provider]  nitrofurantoin , macrocrystal-monohydrate, (MACROBID ) 100 MG capsule Take 1 capsule (100 mg total) by mouth 2 (two) times daily. 10/08/23   Alvan Dorothyann BIRCH, MD  omeprazole  (PRILOSEC) 20 MG capsule TAKE 1 CAPSULE BY MOUTH EVERY DAY 03/27/23   Alvan Dorothyann BIRCH, MD  pravastatin  (PRAVACHOL ) 40 MG tablet Take 1 tablet (40 mg total) by mouth at bedtime. 09/10/23   Alvan Dorothyann BIRCH, MD  Probiotic Product (PROBIOTIC-10 PO) See admin instructions.    [provider]  sertraline  (ZOLOFT ) 100 MG tablet Take 1.5 tablets (150 mg total) by mouth daily. 10/08/23   Alvan Dorothyann BIRCH, MD  traMADol (ULTRAM-ER) 200 MG 24 hr tablet Take 200 mg by mouth daily. 05/20/21   [provider]  traZODone  (DESYREL ) 50 MG tablet TAKE 1 TABLET BY MOUTH EVERYDAY AT BEDTIME 11/05/23   Alvan Dorothyann BIRCH, MD    Family History Family History  Problem Relation Age of Onset   Alzheimer's disease Mother    Other Father        ACS? - 29's   Depression Father    Ataxia Sister     Social History Social History   Tobacco Use   Smoking status: Former    Current packs/day: 0.00    Average packs/day: 1.5 packs/day for 10.0 years (15.0 ttl pk-yrs)    Types: Cigarettes    Start date: 10/23/1970    Quit date: 10/23/1980    Years since quitting: 43.1   Smokeless tobacco: Never   Tobacco comments:    Quit 1982  Vaping Use   Vaping status: Never Used  Substance Use Topics   Alcohol use: No   Drug use: No     Allergies   Cymbalta  [duloxetine  hcl] and Morphine   Review of Systems Review of Systems   Physical Exam Triage Vital Signs ED Triage Vitals  Encounter Vitals Group     BP 11/27/23 1314 123/72     Systolic BP Percentile --      Diastolic BP Percentile --      Pulse Rate 11/27/23 1314 86     Resp 11/27/23 1314 17     Temp 11/27/23 1314 98.2 F (36.8 C)     Temp Source 11/27/23 1314 Oral     SpO2 11/27/23 1314 97 %     Weight --       Height --      Head Circumference --      Peak Flow --      Pain Score 11/27/23 1315 0     Pain Loc --      Pain Education --      Exclude from Growth Chart --    No data found.  Updated Vital Signs BP 123/72 (  BP Location: Right Arm)   Pulse 86   Temp 98.2 F (36.8 C) (Oral)   Resp 17   SpO2 97%      Physical Exam Vitals and nursing note reviewed.  Constitutional:      Appearance: Normal appearance. She is normal weight.  HENT:     Head: Normocephalic and atraumatic.     Mouth/Throat:     Mouth: Mucous membranes are moist.     Pharynx: Oropharynx is clear.  Eyes:     Extraocular Movements: Extraocular movements intact.     Conjunctiva/sclera: Conjunctivae normal.     Pupils: Pupils are equal, round, and reactive to light.  Cardiovascular:     Rate and Rhythm: Normal rate and regular rhythm.     Pulses: Normal pulses.     Heart sounds: Normal heart sounds.  Pulmonary:     Effort: Pulmonary effort is normal.     Breath sounds: Normal breath sounds. No wheezing, rhonchi or rales.  Musculoskeletal:        General: Normal range of motion.     Cervical back: Normal range of motion and neck supple.  Skin:    General: Skin is warm and dry.  Neurological:     General: No focal deficit present.     Mental Status: She is alert and oriented to person, place, and time. Mental status is at baseline.  Psychiatric:        Mood and Affect: Mood normal.        Behavior: Behavior normal.        Thought Content: Thought content normal.      UC Treatments / Results  Labs (all labs ordered are listed, but only abnormal results are displayed) Labs Reviewed  POCT URINALYSIS DIP (MANUAL ENTRY) - Abnormal; Notable for the following components:      Result Value   Blood, UA small (*)    Nitrite, UA Positive (*)    Leukocytes, UA Moderate (2+) (*)    All other components within normal limits  URINE CULTURE    EKG   Radiology No results  found.  Procedures Procedures (including critical care time)  Medications Ordered in UC Medications - No data to display  Initial Impression / Assessment and Plan / UC Course  I have reviewed the triage vital signs and the nursing notes.  Pertinent labs & imaging results that were available during my care of the patient were reviewed by me and considered in my medical decision making (see chart for details).    MDM: 1.  Acute cystitis with hematuria-UA revealed above, urine culture, ordered Rx'd cefdinir  300 mg capsule: Take 1 capsule twice daily x 7 days. Patient to take medication as directed with food to completion.  Encouraged to increase daily water intake to 64 ounces per day while taking this medication.  Advised we will follow-up with urine culture results once received.  Advised if symptoms worsen and/or unresolved please follow-up with your PCP [facility] urology, or here for further evaluation.  Final Clinical Impressions(s) / UC Diagnoses   Final diagnoses:  Acute cystitis with hematuria     Discharge Instructions      Patient to take medication as directed with food to completion.  Encouraged to increase daily water intake to 64 ounces per day while taking this medication.  Advised we will follow-up with urine culture results once received.  Advised if symptoms worsen and/or unresolved please follow-up with your PCP [facility] urology, or here for further  evaluation.     ED Prescriptions     Medication Sig Dispense Auth. Provider   cefdinir  (OMNICEF ) 300 MG capsule Take 1 capsule (300 mg total) by mouth 2 (two) times daily for 7 days. 14 capsule [place] Kretz, FNP      PDMP not reviewed this encounter.   Teddy Sharper, FNP 11/27/23 1355

## 2023-11-27 NOTE — ED Triage Notes (Addendum)
Pt c/o lower pelvic pain x 1 week. Hx of UTIs. At home test pos for UTI on Monday.

## 2023-11-28 ENCOUNTER — Other Ambulatory Visit: Payer: Self-pay

## 2023-11-28 ENCOUNTER — Telehealth: Payer: Self-pay

## 2023-11-28 DIAGNOSIS — N390 Urinary tract infection, site not specified: Secondary | ICD-10-CM

## 2023-11-28 NOTE — Telephone Encounter (Signed)
 Copied from CRM [id number]. Topic: General - Call Back - No Documentation >> Nov 26, 2023  2:57 PM Ivette P wrote: Reason for CRM: Pt missed a phone call and would like a call back from Dr. Parnell Nurse. Pt requesting callback Mliss (daughter)  [phone]  Recurring UTI's having another one and is 72. Wanted to know about doing a urine test and schedule a follow- up.

## 2023-11-28 NOTE — Telephone Encounter (Signed)
 Copied from CRM [id number]. Topic: Referral - Request for Referral >> Nov 27, 2023  3:39 PM Carlatta H wrote: Did the patient discuss referral with their provider in the last year? Yes (If No - schedule appointment) (If Yes - send message)  Appointment offered? No  Type of order/referral and detailed reason for visit: Patient went to the urgent care and it was confirm that she does have a UTI  Preference of office, provider, location: Urology at [facility]  If referral order, have you been seen by this specialty before? No (If Yes, this issue or another issue? When? Where?  Can we respond through MyChart? No

## 2023-11-28 NOTE — Telephone Encounter (Signed)
 Okay to place urology referral per her preference of location below.  Okay to use recurrent UTI

## 2023-11-28 NOTE — Telephone Encounter (Signed)
 Referral order placed in patient chart.

## 2023-11-29 LAB — URINE CULTURE: Culture: >=100000 — AB

## 2023-12-06 ENCOUNTER — Telehealth: Payer: Medicare Other | Admitting: Family Medicine

## 2023-12-06 ENCOUNTER — Encounter: Payer: Self-pay | Admitting: Family Medicine

## 2023-12-06 DIAGNOSIS — N390 Urinary tract infection, site not specified: Secondary | ICD-10-CM | POA: Diagnosis not present

## 2023-12-06 DIAGNOSIS — R251 Tremor, unspecified: Secondary | ICD-10-CM

## 2023-12-06 DIAGNOSIS — R197 Diarrhea, unspecified: Secondary | ICD-10-CM

## 2023-12-06 MED ORDER — PRIMIDONE 50 MG PO TABS: 25.0000 mg | ORAL_TABLET | Freq: Every day | ORAL | 1 refills | Status: DC

## 2023-12-06 NOTE — Progress Notes (Signed)
She was seen in UC for UTI and she finished the ABX (Cefdinir 300 mg BID x 7 days) on Monday.   Pt has an appt with Urology Dr. Pete Glatter on 2/19.   She stated that her hands tremble really bad to the point that it becomes difficult for her to write. She also reports that her legs tremble if she gets in a hurry,anxious. She said that she has mention this before.    As an FYI she wanted our office to know that she is unhappy with our phone answering system. She said that she cannot get in touch with anyone now.

## 2023-12-06 NOTE — Progress Notes (Addendum)
 Virtual Visit via Video Note  I connected with Carolyn Hendrix on 12/11/23 at  9:10 AM EST by a video enabled telemedicine application and verified that I am speaking with the correct person using two identifiers.   I discussed the limitations of evaluation and management by telemedicine and the availability of in person appointments. The patient expressed understanding and agreed to proceed.  Patient location: at home Provider location: in office  Established Patient Office Visit  Subjective  Patient ID: Carolyn Hendrix, female    DOB: 14-Mar-1941  Age: 83 y.o. MRN: [id number]  Chief Complaint  Patient presents with   essential tremors    HPI F/U tremor -she is at the point where she feels like her tremors getting worse especially if she gets anxious when she tried to get ready and rush.  She think she would like to go ahead and start medication for tremor to see if that is helpful.  We had previously discussed that is an option but she been holding off.  She notices it sometimes can make it difficult for her to eat.  Treated on 2/6 for UTI.  He says this is her sixth or seventh UTIs since August.  She does have an appointment with Dr. Pete Glatter, urology in about a week and a half.  She did complete her antibiotics and she is still using her Premarin cream.  She actually just completed her 7 days of antibiotics on Monday and says she still having some intermittent watery diarrhea.  She did take an Imodium today to see if it would help.    ROS    Objective:     There were no vitals taken for this visit.   Physical Exam Vitals reviewed.  Constitutional:      Appearance: Normal appearance.  HENT:     Head: Normocephalic.  Pulmonary:     Effort: Pulmonary effort is normal.  Neurological:     Mental Status: She is alert and oriented to person, place, and time.  Psychiatric:        Mood and Affect: Mood normal.        Behavior: Behavior normal.      No results  found for any visits on 12/06/23.    The ASCVD Risk score (Arnett DK, et al., 2019) failed to calculate for the following reasons:   The 2019 ASCVD risk score is only valid for ages 28 to 75    Assessment & Plan:   Problem List Items Addressed This Visit       Other   Tremor - Primary   We discussed options for treatment.  Will start with primidone we will follow-up in about 8 weeks to make sure that she is tolerating well okay to start with a half or up to a whole tab as needed.  Call if any concerns or problems.  I do feel like her tremor is most consistent with an essential tremor.      Relevant Medications   primidone (MYSOLINE) 50 MG tablet   Other Visit Diagnoses       Recurrent UTI         Diarrhea, unspecified type           Recurrent UTI-she does have an appointment coming up with Dr. Pete Glatter which I think will be helpful she is still using her Premarin.  Diarrhea-we did discuss that if it is not better after the weekend please let me know it could be just a  side effect of the antibiotics but it also could be an indication of a secondary infection such as C. difficile.  She will give me a call back next week if we need to get stool cultures  No follow-ups on file.   I spent 20 minutes on the day of the encounter to include pre-visit record review, face-to-face time with the patient and post visit ordering of test.  Nani Gasser, MD    I discussed the assessment and treatment plan with the patient. The patient was provided an opportunity to ask questions and all were answered. The patient agreed with the plan and demonstrated an understanding of the instructions.   The patient was advised to call back or seek an in-person evaluation if the symptoms worsen or if the condition fails to improve as anticipated.

## 2023-12-06 NOTE — Assessment & Plan Note (Addendum)
We discussed options for treatment.  Will start with primidone we will follow-up in about 8 weeks to make sure that she is tolerating well okay to start with a half or up to a whole tab as needed.  Call if any concerns or problems.  I do feel like her tremor is most consistent with an essential tremor.

## 2023-12-10 ENCOUNTER — Telehealth: Payer: Medicare Other | Admitting: Family Medicine

## 2023-12-11 ENCOUNTER — Ambulatory Visit: Payer: Medicare Other | Admitting: Urology

## 2023-12-11 ENCOUNTER — Encounter: Payer: Self-pay | Admitting: Urology

## 2023-12-11 VITALS — BP 148/70 | HR 94 | Ht 66.0 in | Wt 150.0 lb

## 2023-12-11 DIAGNOSIS — Z87440 Personal history of urinary (tract) infections: Secondary | ICD-10-CM | POA: Diagnosis not present

## 2023-12-11 DIAGNOSIS — N390 Urinary tract infection, site not specified: Secondary | ICD-10-CM | POA: Insufficient documentation

## 2023-12-11 DIAGNOSIS — Z09 Encounter for follow-up examination after completed treatment for conditions other than malignant neoplasm: Secondary | ICD-10-CM | POA: Diagnosis not present

## 2023-12-11 LAB — BLADDER SCAN AMB NON-IMAGING

## 2023-12-11 MED ORDER — CEPHALEXIN 500 MG PO CAPS: 500.0000 mg | ORAL_CAPSULE | Freq: Every day | ORAL | 2 refills | Status: DC

## 2023-12-11 NOTE — Addendum Note (Signed)
 Addended by: Nani Gasser D on: 12/11/2023 08:30 AM   Modules accepted: Level of Service

## 2023-12-11 NOTE — Progress Notes (Signed)
 Assessment: 1. Frequent UTI     Plan: I personally reviewed the patient's chart including provider notes, and lab results. Methods to reduce the risk of UTIs discussed with the patient and her granddaughter including increase fluid intake, timed and double voiding, daily cranberry supplement, daily probiotic, and vaginal hormone cream. Recommend continuing the vaginal hormone cream 2-3 times per week. Discussed the role of a low-dose daily prophylactic antibiotic for UTI prevention. Begin cephalexin 500 mg p.o. daily. Return to office in 6 weeks.  Chief Complaint:  Chief Complaint  Patient presents with   Frequent UTI    History of Present Illness:  Carolyn Hendrix is a 83 y.o. female who is seen in consultation from Agapito Games, MD for evaluation of recurrent UTI's. She has a recent history of frequent UTIs beginning in August 2024.  She was diagnosed with a UTI in August 2024 and again in October 2024.  These were diagnosed and treated through [facility].  No culture results available. UTI symptoms include lower abdominal pain, low back pain, frequency and urgency.  She does not have dysuria or gross hematuria.  No associated flank pain, fever, or chills.  Urine culture results: 1/24 No growth 11/24 >100K E. Coli 11/24 50-100K Enterococcus 12/24 >100K E. Coli 2/25 >100K E. Coli - treated with Cefdinir, completed on 12/03/2023  No current UTI symptoms.  No history of kidney stones. She is status post a hysterectomy for endometriosis. She was recently started on vaginal hormone cream with Premarin.  She has been doing this for approximately 3 weeks.   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 Lorain Childes   lapartomy  1995   OTHER SURGICAL HISTORY  1992, 1994   right breast biopsy- benign     SPINAL FUSION  08/18/1997   Dr. Dorita Fray at [facility], L4-5 post fusion with iliac crest bone graft   TONSILLECTOMY AND ADENOIDECTOMY      Allergies:  Allergies  Allergen Reactions   Cymbalta [Duloxetine Hcl] Diarrhea   Morphine Hives    Family History:  Family History  Problem Relation Age of Onset   Alzheimer's disease Mother    Other Father        ACS? - 63's   Depression Father    Ataxia Sister     Social History:  Social History   Tobacco Use   Smoking status: Former    Current packs/day: 0.00    Average packs/day: 1.5 packs/day for 10.0 years (15.0 ttl pk-yrs)  Types: Cigarettes    Start date: 10/23/1970    Quit date: 10/23/1980    Years since quitting: 43.1   Smokeless tobacco: Never   Tobacco comments:    Quit 1982  Vaping Use   Vaping status: Never Used  Substance Use Topics   Alcohol use: No   Drug use: No    Review of symptoms:  Constitutional:  Negative for unexplained weight loss, night sweats, fever, chills ENT:  Negative for nose bleeds, sinus pain, painful swallowing CV:  Negative for chest pain, shortness of breath, exercise intolerance, palpitations, loss of consciousness Resp:  Negative for cough, wheezing, shortness of breath GI:  Negative for nausea, vomiting, diarrhea, bloody stools GU:  Positives noted in HPI; otherwise negative  for gross hematuria, dysuria, urinary incontinence Neuro:  Negative for seizures, poor balance, limb weakness, slurred speech Psych:  Negative for lack of energy, depression, anxiety Endocrine:  Negative for polydipsia, polyuria, symptoms of hypoglycemia (dizziness, hunger, sweating) Hematologic:  Negative for anemia, purpura, petechia, prolonged or excessive bleeding, use of anticoagulants  Allergic:  Negative for difficulty breathing or choking as a result of exposure to anything; no shellfish allergy; no allergic response (rash/itch) to materials, foods  Physical exam: BP (!) 148/70   Pulse 94   Ht 5\' 6"  (1.676 m)   Wt 150 lb (68 kg)   BMI 24.21 kg/m  GENERAL APPEARANCE:  Well appearing, well developed, well nourished, NAD HEENT: Atraumatic, Normocephalic, oropharynx clear. NECK: Supple without lymphadenopathy or thyromegaly. LUNGS: Clear to auscultation bilaterally. HEART: Regular Rate and Rhythm without murmurs, gallops, or rubs. ABDOMEN: Soft, non-tender, No Masses. EXTREMITIES: Moves all extremities well.  Without clubbing, cyanosis, or edema. NEUROLOGIC:  Alert and oriented x 3, normal gait, CN II-XII grossly intact.  MENTAL STATUS:  Appropriate. BACK:  Non-tender to palpation.  No CVAT SKIN:  Warm, dry and intact.    Results: U/A: negative  PVR: 14 ml

## 2023-12-12 ENCOUNTER — Ambulatory Visit: Payer: Medicare Other | Admitting: Urology

## 2023-12-13 LAB — URINALYSIS
Bilirubin, UA: NEGATIVE
Glucose, UA: NEGATIVE
Ketones, UA: NEGATIVE
Leukocytes,UA: NEGATIVE
Nitrite, UA: NEGATIVE
Protein,UA: NEGATIVE
RBC, UA: NEGATIVE
Specific Gravity, UA: 1.02 (ref 1.005–1.030)
Urobilinogen, Ur: 0.2 mg/dL (ref 0.2–1.0)
pH, UA: 6 (ref 5.0–7.5)

## 2023-12-17 DIAGNOSIS — M461 Sacroiliitis, not elsewhere classified: Secondary | ICD-10-CM | POA: Diagnosis not present

## 2023-12-17 DIAGNOSIS — G894 Chronic pain syndrome: Secondary | ICD-10-CM | POA: Diagnosis not present

## 2023-12-17 DIAGNOSIS — M47816 Spondylosis without myelopathy or radiculopathy, lumbar region: Secondary | ICD-10-CM | POA: Diagnosis not present

## 2023-12-17 DIAGNOSIS — Z79891 Long term (current) use of opiate analgesic: Secondary | ICD-10-CM | POA: Diagnosis not present

## 2024-01-03 NOTE — Telephone Encounter (Signed)
 This has been addressed.

## 2024-01-24 ENCOUNTER — Ambulatory Visit: Payer: Medicare Other | Admitting: Urology

## 2024-01-24 ENCOUNTER — Encounter: Payer: Self-pay | Admitting: Urology

## 2024-01-24 VITALS — BP 136/80 | HR 73 | Ht 66.0 in | Wt 150.0 lb

## 2024-01-24 DIAGNOSIS — N390 Urinary tract infection, site not specified: Secondary | ICD-10-CM

## 2024-01-24 DIAGNOSIS — Z87440 Personal history of urinary (tract) infections: Secondary | ICD-10-CM

## 2024-01-24 DIAGNOSIS — Z09 Encounter for follow-up examination after completed treatment for conditions other than malignant neoplasm: Secondary | ICD-10-CM | POA: Diagnosis not present

## 2024-01-24 LAB — URINALYSIS, ROUTINE W REFLEX MICROSCOPIC
Bilirubin, UA: NEGATIVE
Glucose, UA: NEGATIVE
Ketones, UA: NEGATIVE
Leukocytes,UA: NEGATIVE
Nitrite, UA: NEGATIVE
Protein,UA: NEGATIVE
Specific Gravity, UA: 1.015 (ref 1.005–1.030)
Urobilinogen, Ur: 0.2 mg/dL (ref 0.2–1.0)
pH, UA: 5.5 (ref 5.0–7.5)

## 2024-01-24 LAB — MICROSCOPIC EXAMINATION

## 2024-01-24 NOTE — Progress Notes (Signed)
 Assessment: 1. Frequent UTI     Plan: Continue methods to reduce the risk of UTIs including increase fluid intake, timed and double voiding, daily cranberry supplement, daily probiotic, and vaginal hormone cream. Continue the vaginal hormone cream 2-3 times per week. Continue cephalexin 500 mg p.o. daily. Return to office in 2 months  Chief Complaint:  Chief Complaint  Patient presents with   Frequent UTI    History of Present Illness:  Carolyn Hendrix is a 83 y.o. female who is seen for continued evaluation of recurrent UTI's. She has a recent history of frequent UTIs beginning in August 2024.  She was diagnosed with a UTI in August 2024 and again in October 2024.  These were diagnosed and treated [facility].  No culture results available. UTI symptoms include lower abdominal pain, low back pain, frequency and urgency.  She does not have dysuria or gross hematuria.  No associated flank pain, fever, or chills.  Urine culture results: 1/24 No growth 11/24 >100K E. Coli 11/24 50-100K Enterococcus 12/24 >100K E. Coli 2/25 >100K E. Coli - treated with Cefdinir, completed on 12/03/2023  No current UTI symptoms.  No history of kidney stones. She is status post a hysterectomy for endometriosis. She was started on vaginal hormone cream with Premarin.  She was started on daily cephalexin for UTI prevention in February 2025.  She returns today for follow-up.  She continues on daily cephalexin.  She is doing quite well without any UTI symptoms.  She also is using Premarin vaginal cream 2-3 times per week.  No dysuria or gross hematuria.  Portions of the above documentation were copied from a prior visit for review purposes only.   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 Lorain Childes   lapartomy  1995   OTHER SURGICAL HISTORY  1992, 1994   right breast biopsy- benign     SPINAL FUSION  08/18/1997   Dr. Dorita Fray at [facility], L4-5 post fusion with iliac crest bone graft   TONSILLECTOMY AND ADENOIDECTOMY      Allergies:  Allergies  Allergen Reactions   Cymbalta [Duloxetine Hcl] Diarrhea   Morphine Hives    Family History:  Family History  Problem Relation Age of Onset   Alzheimer's disease Mother    Other Father        ACS? - 85's   Depression Father    Ataxia Sister     Social History:  Social History   Tobacco Use   Smoking status:  Former    Current packs/day: 0.00    Average packs/day: 1.5 packs/day for 10.0 years (15.0 ttl pk-yrs)    Types: Cigarettes    Start date: 10/23/1970    Quit date: 10/23/1980    Years since quitting: 43.2   Smokeless tobacco: Never   Tobacco comments:    Quit 1982  Vaping Use   Vaping status: Never Used  Substance Use Topics   Alcohol use: No   Drug use: No    ROS: Constitutional:  Negative for fever, chills, weight loss CV: Negative for chest pain, previous MI, hypertension Respiratory:  Negative for shortness of breath, wheezing, sleep apnea, frequent cough GI:  Negative for nausea, vomiting, bloody stool, GERD  Physical exam: BP 136/80   Pulse 73   Ht 5\' 6"  (1.676 m)   Wt 150 lb (68 kg)   BMI 24.21 kg/m  GENERAL  APPEARANCE:  Well appearing, well developed, well nourished, NAD HEENT:  Atraumatic, normocephalic, oropharynx clear NECK:  Supple without lymphadenopathy or thyromegaly ABDOMEN:  Soft, non-tender, no masses EXTREMITIES:  Moves all extremities well, without clubbing, cyanosis, or edema NEUROLOGIC:  Alert and oriented x 3, normal gait, CN II-XII grossly intact MENTAL STATUS:  appropriate BACK:  Non-tender to palpation, No CVAT SKIN:  Warm, dry, and intact  Results: U/A: 0-5 WBCs, 0-2 RBCs, few bacteria

## 2024-01-29 DIAGNOSIS — M461 Sacroiliitis, not elsewhere classified: Secondary | ICD-10-CM | POA: Diagnosis not present

## 2024-01-30 ENCOUNTER — Encounter: Payer: Self-pay | Admitting: Family Medicine

## 2024-03-03 ENCOUNTER — Telehealth: Payer: Self-pay | Admitting: Urology

## 2024-03-03 DIAGNOSIS — N390 Urinary tract infection, site not specified: Secondary | ICD-10-CM

## 2024-03-03 NOTE — Telephone Encounter (Signed)
 Pt called and stated that she thinks the cephALEXin  (KEFLEX ) 500 MG capsule  she is on is causing side effects. Such as nausea and diarrhea she would like to know what to do next please advise.

## 2024-03-04 ENCOUNTER — Other Ambulatory Visit: Payer: Self-pay

## 2024-03-04 ENCOUNTER — Ambulatory Visit: Payer: Self-pay | Admitting: Urology

## 2024-03-04 ENCOUNTER — Other Ambulatory Visit

## 2024-03-04 DIAGNOSIS — N390 Urinary tract infection, site not specified: Secondary | ICD-10-CM

## 2024-03-04 LAB — MICROSCOPIC EXAMINATION: WBC, UA: >30 /HPF — AB (ref 0–5)

## 2024-03-04 LAB — URINALYSIS, ROUTINE W REFLEX MICROSCOPIC
Bilirubin, UA: NEGATIVE
Glucose, UA: NEGATIVE
Ketones, UA: NEGATIVE
Nitrite, UA: POSITIVE — AB
Protein,UA: NEGATIVE
Specific Gravity, UA: 1.015 (ref 1.005–1.030)
Urobilinogen, Ur: 0.2 mg/dL (ref 0.2–1.0)
pH, UA: 6 (ref 5.0–7.5)

## 2024-03-04 MED ORDER — CEFDINIR 300 MG PO CAPS: 300.0000 mg | ORAL_CAPSULE | Freq: Two times a day (BID) | ORAL | 0 refills | Status: DC

## 2024-03-04 NOTE — Addendum Note (Signed)
 Addended by: Leonard Raker on: 03/04/2024 09:13 AM   Modules accepted: Orders

## 2024-03-04 NOTE — Telephone Encounter (Signed)
 Spoke with pt about providing a urine sample. Pt added to lab schedule and orders placed.

## 2024-03-04 NOTE — Progress Notes (Signed)
 Per Dr. Willye Harvey, a ucx was ordered.

## 2024-03-04 NOTE — Telephone Encounter (Signed)
 Spoke with pt in reference to keflex . Pt stated that she was at her daughter's when s/s started. Pt described s/s to be nausea, diarrhea, stomach pain radiating into her groin, and dysuria. Pt stated that she stopped the keflex  at her daughters and has been off abx x4d now. Pt states her s/s are not much better other than diarrhea has stopped. Pt states she took at home UTI test and it came back positive. Pt voiced concern of having a UTI. Please advise.

## 2024-03-08 LAB — URINE CULTURE

## 2024-03-10 MED ORDER — CIPROFLOXACIN HCL 500 MG PO TABS: 500.0000 mg | ORAL_TABLET | Freq: Two times a day (BID) | ORAL | 0 refills | Status: AC

## 2024-03-13 ENCOUNTER — Ambulatory Visit (INDEPENDENT_AMBULATORY_CARE_PROVIDER_SITE_OTHER)

## 2024-03-13 VITALS — Ht 66.0 in | Wt 150.0 lb

## 2024-03-13 DIAGNOSIS — Z0000 Encounter for general adult medical examination without abnormal findings: Secondary | ICD-10-CM

## 2024-03-13 NOTE — Progress Notes (Signed)
 Subjective:   ABRIE EGLOFF is a 83 y.o. female who presents for Medicare Annual (Subsequent) preventive examination.  Visit Complete: Virtual I connected with  Lesa Rape on 03/13/24 by a audio enabled telemedicine application and verified that I am speaking with the correct person using two identifiers.  Patient Location: Home  Provider Location: Office/Clinic  I discussed the limitations of evaluation and management by telemedicine. The patient expressed understanding and agreed to proceed.  Vital Signs: Because this visit was a virtual/telehealth visit, some criteria may be missing or patient reported. Any vitals not documented were not able to be obtained and vitals that have been documented are patient reported.  Patient Medicare AWV questionnaire was completed by the patient on 03/13/2024; I have confirmed that all information answered by patient is correct and no changes since this date.  Cardiac Risk Factors include: advanced age (>61men, >40 women);dyslipidemia;sedentary lifestyle;smoking/ tobacco exposure     Objective:    Today's Vitals   03/13/24 1059  Weight: 150 lb (68 kg)  Height: 5\' 6"  (1.676 m)  PainSc: 7    Body mass index is 24.21 kg/m.     03/13/2024   11:16 AM 03/05/2023    9:09 AM 02/27/2022   10:57 AM 11/29/2020   11:16 AM 11/25/2019   11:09 AM 11/19/2018   11:17 AM 05/23/2017    3:21 PM  Advanced Directives  Does Patient Have a Medical Advance Directive? Yes Yes Yes Yes Yes Yes Yes  Type of Estate agent of [place];Living will Living will Living will Healthcare Power of [place];Living will Healthcare Power of [place];Living will Healthcare Power of [place];Living will Healthcare Power of [place];Living will  Does patient want to make changes to medical advance directive? No - Patient declined No - Patient declined No - Patient declined No - Patient declined No - Patient declined No - Patient declined   Copy of Healthcare  Power of Attorney in Chart? No - copy requested   No - copy requested No - copy requested No - copy requested Yes    Current Medications (verified) Outpatient Encounter Medications as of 03/13/2024  Medication Sig   acyclovir  ointment (ZOVIRAX ) 5 % Apply 1 application topically every 3 (three) hours.   alendronate  (FOSAMAX ) 70 MG tablet Take 1 tablet (70 mg total) by mouth once a week. Take with a full glass of water on an empty stomach.   ciprofloxacin  (CIPRO ) 500 MG tablet Take 1 tablet (500 mg total) by mouth every 12 (twelve) hours for 5 days.   conjugated estrogens  (PREMARIN ) vaginal cream Pea sized amount on the urethral area nightly x 2 weeks and then 3 x a week.   dicyclomine  (BENTYL ) 10 MG capsule TAKE 1 CAPSULE BY MOUTH THREE TIMES A DAY AS NEEDED FOR SPASMS   glucosamine-chondroitin 500-400 MG tablet Take 1 tablet by mouth 2 (two) times daily.   Magnesium 300 MG CAPS 1 capsule with a meal   methocarbamol (ROBAXIN) 750 MG tablet Take 1 tablet by mouth every 12 (twelve) hours.   Multiple Vitamins-Minerals (SENTRY) TABS See admin instructions.   omeprazole  (PRILOSEC) 20 MG capsule TAKE 1 CAPSULE BY MOUTH EVERY DAY   pravastatin  (PRAVACHOL ) 40 MG tablet Take 1 tablet (40 mg total) by mouth at bedtime.   Probiotic Product (PROBIOTIC-10 PO) See admin instructions.   sertraline  (ZOLOFT ) 100 MG tablet Take 1.5 tablets (150 mg total) by mouth daily.   traMADol (ULTRAM-ER) 200 MG 24 hr tablet Take 200 mg by mouth  daily.   traZODone  (DESYREL ) 50 MG tablet TAKE 1 TABLET BY MOUTH EVERYDAY AT BEDTIME   primidone  (MYSOLINE ) 50 MG tablet Take 0.5-1 tablets (25-50 mg total) by mouth at bedtime. (Patient not taking: Reported on 03/13/2024)   [DISCONTINUED] cephALEXin  (KEFLEX ) 500 MG capsule Take 1 capsule (500 mg total) by mouth daily.   No facility-administered encounter medications on file as of 03/13/2024.    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 Delena Feil   lapartomy  1995   OTHER SURGICAL HISTORY  1992, 1994   right breast biopsy- benign     SPINAL FUSION  08/18/1997   Dr. Jerline Moon at [facility], L4-5 post fusion with iliac crest bone graft   TONSILLECTOMY AND ADENOIDECTOMY     Family History  Problem Relation Age of Onset   Alzheimer's disease Mother    Other Father        ACS? - 52's   Depression Father    Ataxia Sister    Social History   Socioeconomic History   Marital status: Widowed    Spouse name: Sammie Crigler   Number of children: 2   Years of education: 12   Highest education level: 12th grade  Occupational History   Occupation: retired    Comment: Print production planner for city of [employer]  Tobacco Use   Smoking status: Former    Current packs/day: 0.00    Average packs/day: 1.5 packs/day for 10.0 years (15.0 ttl pk-yrs)    Types: Cigarettes    Start date: 10/23/1970    Quit date: 10/23/1980    Years since quitting: 43.4   Smokeless tobacco: Never   Tobacco comments:    Quit 1982  Vaping Use   Vaping status: Never Used  Substance and Sexual Activity   Alcohol use: No   Drug use: No   Sexual activity: Not Currently    Birth control/protection: Abstinence,  Surgical, None  Other Topics Concern   Not on file  Social History Narrative   Her husband passed away in 10-07-2020. She is getting better at taking care of herself as she had to take care of her husband for the past 2.5 years. She lives alone with her dog. Walk occ. Her family lives close by. She enjoy doing puzzles.     Social Drivers of Corporate investment banker Strain: Low Risk  (03/13/2024)   Overall Financial Resource Strain (CARDIA)    Difficulty of Paying Living Expenses: Not hard at all  Food Insecurity: No  Food Insecurity (03/13/2024)   Hunger Vital Sign    Worried About Running Out of Food in the Last Year: Never true    Ran Out of Food in the Last Year: Never true  Transportation Needs: No Transportation Needs (03/13/2024)   PRAPARE - Administrator, Civil Service (Medical): No    Lack of Transportation (Non-Medical): No  Physical Activity: Inactive (03/13/2024)   Exercise Vital Sign    Days of Exercise per Week: 0 days    Minutes of Exercise per Session: 0 min  Stress: No Stress Concern Present (03/13/2024)   [employer] of Occupational Health - Occupational Stress Questionnaire    Feeling of Stress : Not at all  Social Connections: Moderately Integrated (03/13/2024)   Social Connection and Isolation Panel [NHANES]    Frequency of Communication with Friends and Family: More than three times a week    Frequency of Social Gatherings with Friends and Family: More than three times a week    Attends Religious Services: More than 4 times per year    Active Member of [employer] or Organizations: Yes    Attends Banker Meetings: More than 4 times per year    Marital Status: Widowed    Tobacco Counseling Counseling given: Not Answered Tobacco comments: Quit 1982   Clinical Intake:  Pre-visit preparation completed: Yes  Pain : 0-10 Pain Score: 7  Pain Type: Chronic pain Pain Location: Back Pain Orientation: Lower Pain Descriptors /  Indicators: Aching Pain Onset: More than a month ago Pain Frequency: Constant Pain Relieving Factors: pain medication  Pain Relieving Factors: pain medication  BMI - recorded: 24.21 Nutritional Status: BMI of 19-24  Normal Nutritional Risks: None Diabetes: No  How often do you need to have someone help you when you read instructions, pamphlets, or other written materials from your doctor or pharmacy?: 1 - Never What is the last grade level you completed in school?: 12  Interpreter Needed?: No      Activities of Daily Living    03/13/2024   11:06 AM 03/06/2024    3:56 PM  In your present state of health, do you have any difficulty performing the following activities:  Hearing? 0 0  Vision? 0 0  Difficulty concentrating or making decisions? 0 0  Walking or climbing stairs? 0 0  Dressing or bathing? 0 0  Doing errands, shopping? 0 0  Preparing Food and eating ? N N  Using the Toilet? N N  In the past six months, have you accidently leaked urine? Y Y  Do you have problems with loss of bowel control? N N  Managing your Medications? N N  Managing your Finances? N N  Housekeeping or managing your Housekeeping? N N    Patient Care Team: Cydney Draft, MD as PCP - General (Family Medicine) Willye Harvey, Ponce Brisker., MD as Referring Physician (Urology)  Indicate any recent Medical Services you may have received from other than [facility] providers in the past year (date may be approximate).     Assessment:   This is a routine wellness examination for Karlisha.  Hearing/Vision screen No results found.   Goals Addressed             This Visit's Progress    Patient Stated       Patient states she would like to be free of the UTI.       Depression Screen    03/13/2024   11:13 AM 12/06/2023  9:10 AM 10/08/2023    1:46 PM 06/08/2023    1:54 PM 03/05/2023    9:09 AM 02/06/2023    3:09 PM 11/23/2022    2:48 PM  PHQ 2/9 Scores  PHQ - 2 Score 1 4 1 2  0 0 4  PHQ- 9 Score   8 3 3   0 10    Fall Risk    03/13/2024   11:17 AM 03/06/2024    3:56 PM 10/08/2023    1:46 PM 06/08/2023    1:53 PM 03/05/2023    9:13 AM  Fall Risk   Falls in the past year? 1 0  0 0  Number falls in past yr: 0 0 0 0 0  Injury with Fall? 0  0 0 1  Risk for fall due to : Impaired balance/gait  No Fall Risks No Fall Risks History of fall(s)  Follow up Education provided;Falls evaluation completed  Falls evaluation completed Falls evaluation completed Falls evaluation completed;Education provided;Falls prevention discussed    MEDICARE RISK AT HOME: Medicare Risk at Home Any stairs in or around the home?: No Home free of loose throw rugs in walkways, pet beds, electrical cords, etc?: Yes Adequate lighting in your home to reduce risk of falls?: Yes Life alert?: No Use of a cane, walker or w/c?: Yes Grab bars in the bathroom?: Yes Shower chair or bench in shower?: Yes Elevated toilet seat or a handicapped toilet?: Yes  TIMED UP AND GO:  Was the test performed?  No    Cognitive Function:    09/24/2014    7:33 AM  MMSE - Mini Mental State Exam  Orientation to time 0  Orientation to Place 0  Registration 0  Attention/ Calculation 0  Recall 0  Language- name 2 objects 0  Language- repeat 0  Language- follow 3 step command 0  Language- read & follow direction 0  Write a sentence 0  Copy design 0  Total score 0      03/23/2022    4:45 PM  Montreal Cognitive Assessment   Visuospatial/ Executive (0/5) 2  Naming (0/3) 3  Attention: Read list of digits (0/2) 2  Attention: Read list of letters (0/1) 1  Attention: Serial 7 subtraction starting at 100 (0/3) 3  Language: Repeat phrase (0/2) 2  Language : Fluency (0/1) 1  Abstraction (0/2) 0  Delayed Recall (0/5) 3  Orientation (0/6) 6  Total 23  Adjusted Score (based on education) 23      03/13/2024   11:19 AM 03/05/2023    9:14 AM 02/27/2022   11:05 AM 11/29/2020   11:32 AM 11/25/2019   11:14 AM  6CIT Screen  What Year? 0  points 0 points 0 points 0 points 0 points  What month? 0 points 0 points 0 points 0 points 0 points  What time? 0 points 0 points 0 points 0 points 0 points  Count back from 20 0 points 0 points 0 points 0 points 0 points  Months in reverse 0 points 0 points 0 points 0 points 0 points  Repeat phrase 0 points 0 points 0 points 0 points 0 points  Total Score 0 points 0 points 0 points 0 points 0 points    Immunizations Immunization History  Administered Date(s) Administered   Fluad Quad(high Dose 65+) 06/13/2019, 08/10/2020, 07/26/2021   Influenza Split 08/30/2011, 08/07/2012   Influenza Whole 08/19/2008, 08/03/2010   Influenza, Seasonal, Injecte, Preservative Fre 07/22/2013   Influenza,inj,Quad PF,6+ Mos 09/23/2014, 06/25/2015, 09/08/2016  Influenza-Unspecified 06/27/2017, 08/11/2018   PFIZER(Purple Top)SARS-COV-2 Vaccination 01/01/2020, 01/22/2020, 08/24/2020   Pneumococcal Conjugate-13 09/23/2014   Pneumococcal Polysaccharide-23 06/02/2009   Td 05/16/2006   Tdap 12/18/2016   Zoster Recombinant(Shingrix) 11/22/2018, 05/14/2019   Zoster, Live 05/30/2011    TDAP status: Up to date  Flu Vaccine status: Due, Education has been provided regarding the importance of this vaccine. Advised may receive this vaccine at local pharmacy or Health Dept. Aware to provide a copy of the vaccination record if obtained from local pharmacy or Health Dept. Verbalized acceptance and understanding.  Pneumococcal vaccine status: Up to date  Covid-19 vaccine status: Information provided on how to obtain vaccines.   Qualifies for Shingles Vaccine? Yes   Zostavax completed Yes   Shingrix Completed?: Yes  Screening Tests Health Maintenance  Topic Date Due   COVID-19 Vaccine (4 - 2024-25 season) 06/24/2023   INFLUENZA VACCINE  05/23/2024   Medicare Annual Wellness (AWV)  03/13/2025   DTaP/Tdap/Td (3 - Td or Tdap) 12/18/2026   Pneumonia Vaccine 78+ Years old  Completed   DEXA SCAN  Completed    Zoster Vaccines- Shingrix  Completed   HPV VACCINES  Aged Out   Meningococcal B Vaccine  Aged Out   Colonoscopy  Discontinued    Health Maintenance  Health Maintenance Due  Topic Date Due   COVID-19 Vaccine (4 - 2024-25 season) 06/24/2023    Colorectal cancer screening: Type of screening: Colonoscopy. Completed 11/17/2015. Repeat every 10 years  Mammogram status: No longer required due to age.  Bone Density status: Completed 05/30/2023. Results reflect: Bone density results: OSTEOPENIA. Repeat every 2 years.  Lung Cancer Screening: (Low Dose CT Chest recommended if Age 49-80 years, 20 pack-year currently smoking OR have quit w/in 15years.) does not qualify.   Lung Cancer Screening Referral: n/a  Additional Screening:  Hepatitis C Screening: does not qualify; Completed   Vision Screening: Recommended annual ophthalmology exams for early detection of glaucoma and other disorders of the eye. Is the patient up to date with their annual eye exam?  Yes  Who is the provider or what is the name of the office in which the patient attends annual eye exams? [facility] If pt is not established with a provider, would they like to be referred to a provider to establish care? N/a.   Dental Screening: Recommended annual dental exams for proper oral hygiene   Community Resource Referral / Chronic Care Management: CRR required this visit?  No   CCM required this visit?  No     Plan:     I have personally reviewed and noted the following in the patient's chart:   Medical and social history Use of alcohol, tobacco or illicit drugs  Current medications and supplements including opioid prescriptions. Patient is currently taking opioid prescriptions. Information provided to patient regarding non-opioid alternatives. Patient advised to discuss non-opioid treatment plan with their provider. Functional ability and status Nutritional status Physical activity Advanced directives List of  other physicians Hospitalizations, surgeries, and ER visits in previous 12 months. None Vitals Screenings to include cognitive, depression, and falls Referrals and appointments  In addition, I have reviewed and discussed with patient certain preventive protocols, quality metrics, and best practice recommendations. A written personalized care plan for preventive services as well as general preventive health recommendations were provided to patient.     Aubrey Leaf, [place]   03/13/2024   After Visit Summary: (MyChart) Due to this being a telephonic visit, the after visit summary with patients  personalized plan was offered to patient via MyChart   Nurse Notes:     MARVINA DANNER is a 83 y.o. female patient of Metheney, Corita Diego, MD who had a Medicare Annual Wellness Visit today via telephone. Ester is Retired and lives alone. She has 2 children. She reports that she is socially active and does interact with friends/family regularly. She is minimally physically active and enjoys puzzles.

## 2024-03-13 NOTE — Patient Instructions (Signed)
  Carolyn Hendrix , Thank you for taking time to come for your Medicare Wellness Visit. I appreciate your ongoing commitment to your health goals. Please review the following plan we discussed and let me know if I can assist you in the future.   These are the goals we discussed:  Goals       Exercise 3x per week (30 min per time)      Try and walk 3 times a week for at least 30 minutes at a time. This will relieve you stress level as well.      Patient Stated (pt-stated)      11/29/2020 AWV Goal: Exercise for General Health  Patient will verbalize understanding of the benefits of increased physical activity: Exercising regularly is important. It will improve your overall fitness, flexibility, and endurance. Regular exercise also will improve your overall health. It can help you control your weight, reduce stress, and improve your bone density. Over the next year, patient will increase physical activity as tolerated with a goal of at least 150 minutes of moderate physical activity per week.  You can tell that you are exercising at a moderate intensity if your heart starts beating faster and you start breathing faster but can still hold a conversation. Moderate-intensity exercise ideas include: Walking 1 mile (1.6 km) in about 15 minutes Biking Hiking Golfing Dancing Water aerobics Patient will verbalize understanding of everyday activities that increase physical activity by providing examples like the following: Yard work, such as: Insurance underwriter Gardening Washing windows or floors Patient will be able to explain general safety guidelines for exercising:  Before you start a new exercise program, talk with your health care provider. Do not exercise so much that you hurt yourself, feel dizzy, or get very short of breath. Wear comfortable clothes and wear shoes with good support. Drink plenty of water while  you exercise to prevent dehydration or heat stroke. Work out until your breathing and your heartbeat get faster.       Patient Stated (pt-stated)      Would like to work on her mental health and physical health.       Patient Stated (pt-stated)      Patient would like to be able to walk better and be pain free.      Patient Stated      Patient states she would like to be free of the UTI.         This is a list of the screening recommended for you and due dates:  Health Maintenance  Topic Date Due   COVID-19 Vaccine (4 - 2024-25 season) 06/24/2023   Flu Shot  05/23/2024   Medicare Annual Wellness Visit  03/13/2025   DTaP/Tdap/Td vaccine (3 - Td or Tdap) 12/18/2026   Pneumonia Vaccine  Completed   DEXA scan (bone density measurement)  Completed   Zoster (Shingles) Vaccine  Completed   HPV Vaccine  Aged Out   Meningitis B Vaccine  Aged Out   Colon Cancer Screening  Discontinued

## 2024-03-18 DIAGNOSIS — M461 Sacroiliitis, not elsewhere classified: Secondary | ICD-10-CM | POA: Diagnosis not present

## 2024-03-18 DIAGNOSIS — M47816 Spondylosis without myelopathy or radiculopathy, lumbar region: Secondary | ICD-10-CM | POA: Diagnosis not present

## 2024-03-18 DIAGNOSIS — M5136 Other intervertebral disc degeneration, lumbar region with discogenic back pain only: Secondary | ICD-10-CM | POA: Diagnosis not present

## 2024-03-18 DIAGNOSIS — M48061 Spinal stenosis, lumbar region without neurogenic claudication: Secondary | ICD-10-CM | POA: Diagnosis not present

## 2024-03-18 DIAGNOSIS — G894 Chronic pain syndrome: Secondary | ICD-10-CM | POA: Diagnosis not present

## 2024-03-22 ENCOUNTER — Other Ambulatory Visit: Payer: Self-pay | Admitting: Family Medicine

## 2024-03-22 DIAGNOSIS — F339 Major depressive disorder, recurrent, unspecified: Secondary | ICD-10-CM

## 2024-03-24 ENCOUNTER — Other Ambulatory Visit: Payer: Self-pay | Admitting: Family Medicine

## 2024-03-27 ENCOUNTER — Encounter: Payer: Self-pay | Admitting: Urology

## 2024-03-27 ENCOUNTER — Ambulatory Visit: Admitting: Urology

## 2024-03-27 VITALS — BP 138/80 | HR 86 | Ht 66.0 in | Wt 152.0 lb

## 2024-03-27 DIAGNOSIS — R8290 Unspecified abnormal findings in urine: Secondary | ICD-10-CM

## 2024-03-27 DIAGNOSIS — Z87440 Personal history of urinary (tract) infections: Secondary | ICD-10-CM

## 2024-03-27 DIAGNOSIS — N390 Urinary tract infection, site not specified: Secondary | ICD-10-CM | POA: Diagnosis not present

## 2024-03-27 LAB — MICROSCOPIC EXAMINATION: WBC, UA: >30 /HPF — AB (ref 0–5)

## 2024-03-27 LAB — URINALYSIS, ROUTINE W REFLEX MICROSCOPIC
Bilirubin, UA: NEGATIVE
Glucose, UA: NEGATIVE
Ketones, UA: NEGATIVE
Nitrite, UA: NEGATIVE
Protein,UA: NEGATIVE
Specific Gravity, UA: 1.015 (ref 1.005–1.030)
Urobilinogen, Ur: 0.2 mg/dL (ref 0.2–1.0)
pH, UA: 7 (ref 5.0–7.5)

## 2024-03-27 MED ORDER — CIPROFLOXACIN HCL 500 MG PO TABS: 500.0000 mg | ORAL_TABLET | Freq: Two times a day (BID) | ORAL | 0 refills | Status: AC

## 2024-03-27 NOTE — Progress Notes (Signed)
 Assessment: 1. Frequent UTI     Plan: Continue methods to reduce the risk of UTIs including increase fluid intake, timed and double voiding, daily cranberry supplement, daily probiotic, and vaginal hormone cream. Continue the vaginal hormone cream 2-3 times per week. Urine culture sent today Begin Cipro  500 mg twice daily x 5 days.  Prescription sent. Will contact her with results. Consider methenamine 1 g twice daily for UTI prevention. Return to office in 3 months  Chief Complaint:  Chief Complaint  Patient presents with   Frequent UTI    History of Present Illness:  Carolyn Hendrix is a 83 y.o. female who is seen for continued evaluation of recurrent UTI's. She has a recent history of frequent UTIs beginning in August 2024.  She was diagnosed with a UTI in August 2024 and again in October 2024.  These were diagnosed and treated through [facility].  No culture results available. UTI symptoms include lower abdominal pain, low back pain, frequency and urgency.  She does not have dysuria or gross hematuria.  No associated flank pain, fever, or chills.  Urine culture results: 1/24 No growth 11/24 >100K E. Coli 11/24 50-100K Enterococcus 12/24 >100K E. Coli 2/25 >100K E. Coli - treated with Cefdinir , completed on 12/03/2023 5/25 >100K Citrobacter   PVR = 14 ml.  She is status post a hysterectomy for endometriosis. She was started on vaginal hormone cream with Premarin .  She was started on daily cephalexin  for UTI prevention in February 2025. She was doing well on the daily cephalexin  at her visit in April 2025. She discontinued the cephalexin  secondary to diarrhea.  She developed UTI symptoms in mid May 2025.  Urine culture grew >100 K Citrobacter.  She was initially treated with cefdinir  but changed to Cipro .  She returns today for follow-up.  She is not having any UTI symptoms at the present time.  No dysuria or gross hematuria.  Portions of the above  documentation were copied from a prior visit for review purposes only.   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 Delena Feil   lapartomy  1995   OTHER SURGICAL HISTORY  1992, 1994   right breast biopsy- benign     SPINAL FUSION  08/18/1997   Dr. Jerline Moon at [facility], L4-5 post fusion with iliac crest bone graft   TONSILLECTOMY AND ADENOIDECTOMY      Allergies:  Allergies  Allergen Reactions   Cymbalta  [Duloxetine  Hcl] Diarrhea   Morphine Hives    Family History:  Family History  Problem Relation Age of Onset   Alzheimer's disease Mother    Other Father        ACS? - 37's   Depression Father    Ataxia Sister     Social History:  Social History   Tobacco Use   Smoking status: Former    Current packs/day: 0.00    Average packs/day: 1.5 packs/day for 10.0 years (15.0 ttl pk-yrs)    Types: Cigarettes    Start date: 10/23/1970    Quit date: 10/23/1980    Years since quitting: 43.4   Smokeless tobacco: Never   Tobacco comments:    Quit 1982  Vaping Use   Vaping status: Never Used  Substance Use Topics   Alcohol use: No   Drug use: No    ROS: Constitutional:  Negative for fever, chills, weight  loss CV: Negative for chest pain, previous MI, hypertension Respiratory:  Negative for shortness of breath, wheezing, sleep apnea, frequent cough GI:  Negative for nausea, vomiting, bloody stool, GERD  Physical exam: BP 138/80   Pulse 86   Ht 5\' 6"  (1.676 m)   Wt 152 lb (68.9 kg)   BMI 24.53 kg/m  GENERAL APPEARANCE:  Well appearing, well developed, well nourished, NAD HEENT:  Atraumatic, normocephalic, oropharynx clear NECK:  Supple without lymphadenopathy or thyromegaly ABDOMEN:  Soft, non-tender, no masses EXTREMITIES:  Moves all extremities well, without clubbing, cyanosis, or edema NEUROLOGIC:  Alert and oriented x 3, normal gait, CN II-XII grossly intact MENTAL STATUS:  appropriate BACK:  Non-tender to palpation, No CVAT SKIN:  Warm, dry, and intact  Results: U/A: >30 WBCs 3-10 RBCs, moderate bacteria

## 2024-03-30 LAB — URINE CULTURE

## 2024-03-31 ENCOUNTER — Ambulatory Visit: Payer: Self-pay | Admitting: Urology

## 2024-03-31 DIAGNOSIS — N390 Urinary tract infection, site not specified: Secondary | ICD-10-CM

## 2024-03-31 MED ORDER — METHENAMINE HIPPURATE 1 G PO TABS: 1.0000 g | ORAL_TABLET | Freq: Two times a day (BID) | ORAL | 5 refills | Status: DC

## 2024-04-10 ENCOUNTER — Ambulatory Visit (INDEPENDENT_AMBULATORY_CARE_PROVIDER_SITE_OTHER): Admitting: Family Medicine

## 2024-04-10 ENCOUNTER — Encounter: Payer: Self-pay | Admitting: Family Medicine

## 2024-04-10 VITALS — BP 117/62 | HR 90 | Ht 66.0 in | Wt 152.0 lb

## 2024-04-10 DIAGNOSIS — G250 Essential tremor: Secondary | ICD-10-CM | POA: Diagnosis not present

## 2024-04-10 DIAGNOSIS — G5793 Unspecified mononeuropathy of bilateral lower limbs: Secondary | ICD-10-CM | POA: Diagnosis not present

## 2024-04-10 DIAGNOSIS — R251 Tremor, unspecified: Secondary | ICD-10-CM

## 2024-04-10 DIAGNOSIS — F339 Major depressive disorder, recurrent, unspecified: Secondary | ICD-10-CM

## 2024-04-10 DIAGNOSIS — E611 Iron deficiency: Secondary | ICD-10-CM

## 2024-04-10 DIAGNOSIS — N182 Chronic kidney disease, stage 2 (mild): Secondary | ICD-10-CM | POA: Insufficient documentation

## 2024-04-10 DIAGNOSIS — E7800 Pure hypercholesterolemia, unspecified: Secondary | ICD-10-CM

## 2024-04-10 DIAGNOSIS — M810 Age-related osteoporosis without current pathological fracture: Secondary | ICD-10-CM | POA: Diagnosis not present

## 2024-04-10 DIAGNOSIS — R5381 Other malaise: Secondary | ICD-10-CM

## 2024-04-10 NOTE — Assessment & Plan Note (Addendum)
 Continue bisphosphonate.  Last DEXA August 2024 showing an improved T-score of -1.1

## 2024-04-10 NOTE — Assessment & Plan Note (Signed)
 Will check B vitamins and CBC as well as iron today.

## 2024-04-10 NOTE — Patient Instructions (Signed)
 You are due for an RSV vaccine.  It is covered under your Medicare part D so you have to get this 1 done at the pharmacy for it to be covered by your insurance.

## 2024-04-10 NOTE — Assessment & Plan Note (Signed)
 I am still not quite clear if this is more of a benign essential tremor or more of a physiologic tremor worsened by anxiety initially when I saw her back in February I thought it was more of a essential tremor but it sounds like she did not have a good response to primidone .  Into talking to her today I get more sense that it rarely happens at home when she is feeling well and only happens when she is stressed or out of the house or planning on getting ready to leave the house.  And sometimes will even have palpitations around the same time.  So I am really thinking this is maybe stress anxiety induced.  We discussed how stress can increase adrenaline which can sometimes exacerbate if it is the illogic tremor.

## 2024-04-10 NOTE — Assessment & Plan Note (Signed)
 She is currently on Zoloft  and trazodone .  She already takes 1-1/2 tabs of the sertraline  which should be helping some with anxiety as well.

## 2024-04-10 NOTE — Progress Notes (Signed)
 Established Patient Office Visit  Subjective  Patient ID: Carolyn Hendrix, female    DOB: July 14, 1941  Age: 83 y.o. MRN: [id number]  Chief Complaint  Patient presents with   Medical Management of Chronic Issues    Pt would like to discuss the tremors that she has. She stated that whenever she gets busy doing something she starts to shake. She said that the medication makes her dizzy.    HPI  At her last visit in February we discussed options to treat her tremor seems to get worse when she is anxious or getting out of the house.  We discussed doing a trial of primidone  and then following up to see if she felt like it was helpful had encouraged her to start with a half and then okay to go up if needed.   Tried it for about 2 months but felt like the was making her feel more dizzy so she had reached out test we tapered off and stopped it she definitely felt like it was worsening her chronic dizziness and felt better off of it and she really did not folic it helped her tremor all that much.  The tremor is mostly when she is getting ready to leave the house or when she is out and about it rarely happens at home.  She sometimes will even feel her heart race a little bit.  She is also had numbness and tingling in both of her feet.  She sometimes gets a burning sensation particularly in the bottoms of her feet at night.  She was recently started on Lyrica  by her pain management doctor.  She says it has helped some more with her back pain and she has been tolerating it well.  She has been more tired.  She wonders if it is just Ainge.  But she knows having chronic pain really wears on her as well.    She wants to know if she is also due for any vaccines today    ROS    Objective:     BP 117/62   Pulse 90   Ht 5' 6 (1.676 m)   Wt 152 lb (68.9 kg)   SpO2 96%   BMI 24.53 kg/m    Physical Exam Vitals and nursing note reviewed.  Constitutional:      Appearance: Normal appearance.   HENT:     Head: Normocephalic and atraumatic.   Eyes:     Conjunctiva/sclera: Conjunctivae normal.    Cardiovascular:     Rate and Rhythm: Normal rate and regular rhythm.  Pulmonary:     Effort: Pulmonary effort is normal.     Breath sounds: Normal breath sounds.   Skin:    General: Skin is warm and dry.   Neurological:     Mental Status: She is alert.   Psychiatric:        Mood and Affect: Mood normal.      No results found for any visits on 04/10/24.    The ASCVD Risk score (Arnett DK, et al., 2019) failed to calculate for the following reasons:   The 2019 ASCVD risk score is only valid for ages 44 to 76    Assessment & Plan:   Problem List Items Addressed This Visit       Nervous and Auditory   Neuropathy of both feet   Will check for mineral deficiency.  It seems to bother her a little bit more at night.  I do not have the  additional information but it sounds like it could be coming from her low back.  She has had chronic pain and issues with lumbosacral spondylosis and degenerative disc disease.  We could certainly rule out deficiency cause as well.      Relevant Medications   pregabalin  (LYRICA ) 25 MG capsule   Other Relevant Orders   B12   Vitamin B6   Vitamin B1   Fe+TIBC+Fer     Musculoskeletal and Integument   Osteoporosis   Continue bisphosphonate.  Last DEXA August 2024 showing an improved T-score of -1.1      Relevant Orders   CMP14+EGFR   CBC with Differential/Platelet   B12   Vitamin B6   Vitamin B1     Genitourinary   CKD (chronic kidney disease) stage 2, GFR 60-89 ml/min   Due to recheck renal function.  Her GFR tends to bounce between 50 and 60s.      Relevant Orders   CMP14+EGFR   CBC with Differential/Platelet   B12   Vitamin B6   Vitamin B1     Other   Tremor of unknown origin - Primary   I am still not quite clear if this is more of a benign essential tremor or more of a physiologic tremor worsened by anxiety  initially when I saw her back in February I thought it was more of a essential tremor but it sounds like she did not have a good response to primidone .  Into talking to her today I get more sense that it rarely happens at home when she is feeling well and only happens when she is stressed or out of the house or planning on getting ready to leave the house.  And sometimes will even have palpitations around the same time.  So I am really thinking this is maybe stress anxiety induced.  We discussed how stress can increase adrenaline which can sometimes exacerbate if it is the illogic tremor.      Malaise and fatigue   Will check B vitamins and CBC as well as iron today.      Major depression, recurrent, chronic (HCC)   She is currently on Zoloft  and trazodone .  She already takes 1-1/2 tabs of the sertraline  which should be helping some with anxiety as well.      Low iron   Hyperlipidemia   Relevant Orders   Lipid panel    No follow-ups on file.    Duaine German, MD

## 2024-04-10 NOTE — Assessment & Plan Note (Signed)
 Due to recheck renal function.  Her GFR tends to bounce between 50 and 60s.

## 2024-04-10 NOTE — Assessment & Plan Note (Signed)
 Will check for mineral deficiency.  It seems to bother her a little bit more at night.  I do not have the additional information but it sounds like it could be coming from her low back.  She has had chronic pain and issues with lumbosacral spondylosis and degenerative disc disease.  We could certainly rule out deficiency cause as well.

## 2024-04-11 LAB — CBC WITH DIFFERENTIAL/PLATELET
Hematocrit: 43.7 % (ref 34.0–46.6)
Hemoglobin: 14 g/dL (ref 11.1–15.9)
Lymphocytes Absolute: 1.6 10*3/uL (ref 0.7–3.1)
Lymphs: 22 %
MCV: 95 fL (ref 79–97)
Neutrophils: 69 %
Platelets: 323 10*3/uL (ref 150–450)
WBC: 7.1 10*3/uL (ref 3.4–10.8)

## 2024-04-11 LAB — CMP14+EGFR
AST: 22 IU/L (ref 0–40)
Calcium: 9.1 mg/dL (ref 8.7–10.3)
Potassium: 5 mmol/L (ref 3.5–5.2)
eGFR: 71 mL/min/{1.73_m2} (ref >59)

## 2024-04-11 LAB — LIPID PANEL
Chol/HDL Ratio: 4.6 ratio — ABNORMAL HIGH (ref 0.0–4.4)
LDL Chol Calc (NIH): 117 mg/dL — ABNORMAL HIGH (ref 0–99)

## 2024-04-11 LAB — VITAMIN B12: Vitamin B-12: 492 pg/mL (ref 232–1245)

## 2024-04-14 DIAGNOSIS — M47816 Spondylosis without myelopathy or radiculopathy, lumbar region: Secondary | ICD-10-CM | POA: Diagnosis not present

## 2024-04-14 DIAGNOSIS — M5459 Other low back pain: Secondary | ICD-10-CM | POA: Diagnosis not present

## 2024-04-15 LAB — LIPID PANEL
Cholesterol, Total: 195 mg/dL (ref 100–199)
HDL: 42 mg/dL (ref >39)
Triglycerides: 203 mg/dL — ABNORMAL HIGH (ref 0–149)
VLDL Cholesterol Cal: 36 mg/dL (ref 5–40)

## 2024-04-15 LAB — CBC WITH DIFFERENTIAL/PLATELET
Basophils Absolute: 0.1 10*3/uL (ref 0.0–0.2)
Basos: 1 %
EOS (ABSOLUTE): 0.1 10*3/uL (ref 0.0–0.4)
Eos: 2 %
Immature Grans (Abs): 0 10*3/uL (ref 0.0–0.1)
Immature Granulocytes: 0 %
MCH: 30.4 pg (ref 26.6–33.0)
MCHC: 32 g/dL (ref 31.5–35.7)
Monocytes Absolute: 0.4 10*3/uL (ref 0.1–0.9)
Monocytes: 6 %
Neutrophils Absolute: 4.9 10*3/uL (ref 1.4–7.0)
RBC: 4.6 x10E6/uL (ref 3.77–5.28)
RDW: 13.4 % (ref 11.7–15.4)

## 2024-04-15 LAB — CMP14+EGFR
ALT: 14 IU/L (ref 0–32)
Albumin: 4.5 g/dL (ref 3.8–4.8)
Alkaline Phosphatase: 103 IU/L (ref 44–121)
BUN/Creatinine Ratio: 20 (ref 12–28)
BUN: 17 mg/dL (ref 8–27)
Bilirubin Total: 0.2 mg/dL (ref 0.0–1.2)
CO2: 25 mmol/L (ref 20–29)
Chloride: 103 mmol/L (ref 96–106)
Creatinine, Ser: 0.83 mg/dL (ref 0.57–1.00)
Globulin, Total: 2.2 g/dL (ref 1.5–4.5)
Glucose: 92 mg/dL (ref 70–99)
Sodium: 143 mmol/L (ref 134–144)
Total Protein: 6.7 g/dL (ref 6.0–8.5)

## 2024-04-15 LAB — VITAMIN B6: Vitamin B6: 43.8 ug/L (ref 3.4–65.2)

## 2024-04-15 LAB — VITAMIN B1: Thiamine: 133.7 nmol/L (ref 66.5–200.0)

## 2024-04-15 LAB — IRON,TIBC AND FERRITIN PANEL
Ferritin: 39 ng/mL (ref 15–150)
Iron Saturation: 23 % (ref 15–55)
Iron: 70 ug/dL (ref 27–139)
Total Iron Binding Capacity: 311 ug/dL (ref 250–450)
UIBC: 241 ug/dL (ref 118–369)

## 2024-04-16 ENCOUNTER — Ambulatory Visit: Payer: Self-pay | Admitting: Family Medicine

## 2024-04-16 DIAGNOSIS — R790 Abnormal level of blood mineral: Secondary | ICD-10-CM | POA: Insufficient documentation

## 2024-04-16 NOTE — Progress Notes (Signed)
 Hi Carolyn Hendrix, LDL and triglycerides are up a little bit.  Metabolic panel and blood count look great.  B12 looks good.  Vitamin B6 and B1 are normal.  Iron stores are on the low end.  We usually like to have the ferritin above 40.  So would encourage you to take either a multivitamin with iron or if you want to take a little over-the-counter extra iron that would work as well.  Also try to eat iron rich foods to help increase absorption as well.

## 2024-04-17 DIAGNOSIS — H526 Other disorders of refraction: Secondary | ICD-10-CM | POA: Diagnosis not present

## 2024-04-17 DIAGNOSIS — H26493 Other secondary cataract, bilateral: Secondary | ICD-10-CM | POA: Diagnosis not present

## 2024-04-17 DIAGNOSIS — H43813 Vitreous degeneration, bilateral: Secondary | ICD-10-CM | POA: Diagnosis not present

## 2024-04-17 DIAGNOSIS — Z961 Presence of intraocular lens: Secondary | ICD-10-CM | POA: Diagnosis not present

## 2024-04-17 DIAGNOSIS — H26492 Other secondary cataract, left eye: Secondary | ICD-10-CM | POA: Diagnosis not present

## 2024-05-06 ENCOUNTER — Other Ambulatory Visit (INDEPENDENT_AMBULATORY_CARE_PROVIDER_SITE_OTHER)

## 2024-05-06 ENCOUNTER — Ambulatory Visit (INDEPENDENT_AMBULATORY_CARE_PROVIDER_SITE_OTHER): Admitting: Sports Medicine

## 2024-05-06 DIAGNOSIS — M75102 Unspecified rotator cuff tear or rupture of left shoulder, not specified as traumatic: Secondary | ICD-10-CM

## 2024-05-06 DIAGNOSIS — M12812 Other specific arthropathies, not elsewhere classified, left shoulder: Secondary | ICD-10-CM

## 2024-05-06 MED ORDER — TRIAMCINOLONE ACETONIDE 40 MG/ML IJ SUSP
40.0000 mg | Freq: Once | INTRAMUSCULAR | Status: AC
  Administered 2024-05-06: 40 mg via INTRA_ARTICULAR

## 2024-05-06 NOTE — Progress Notes (Signed)
    Procedures performed today:    Procedure: Real-time Ultrasound Guided injection of the left glenohumeral joint Device: Samsung HS60  Verbal informed consent obtained.  Time-out conducted.  Noted no overlying erythema, induration, or other signs of local infection.  Skin prepped in a sterile fashion.  Local anesthesia: Topical Ethyl chloride.  With sterile technique and under real time ultrasound guidance: Arthritic joint noted, 1 cc Kenalog  40, 2 cc lidocaine, 2 cc bupivacaine injected easily Completed without difficulty  Advised to call if fevers/chills, erythema, induration, drainage, or persistent bleeding.  Images permanently stored and available for review in PACS.  Impression: Technically successful ultrasound guided injection.  Independent interpretation of notes and tests performed by another provider:   None.  Brief History, Exam, Impression, and Recommendations:    Rotator cuff tear arthropathy, left This pleasant 83 year old female returns, she has known rotator cuff tear arthropathy, last injection was in January of this year, she gets narcotics from her PCP. I have recommended shoulder arthroplasty in the past. Per her request we repeated the glenohumeral joint injection today, return to see me as needed.    ____________________________________________ Debby PARAS. Curtis, M.D., ABFM., CAQSM., AME. Primary Care and Sports Medicine  [facility]  Adjunct Professor of [facility] [facility] [employer] [facility] Medicine  [employer]  Restaurant manager, fast food

## 2024-05-06 NOTE — Assessment & Plan Note (Signed)
 This pleasant 83 year old female returns, she has known rotator cuff tear arthropathy, last injection was in January of this year, she gets narcotics from her PCP. I have recommended shoulder arthroplasty in the past. Per her request we repeated the glenohumeral joint injection today, return to see me as needed.

## 2024-05-06 NOTE — Addendum Note (Signed)
 Addended by: OLEY CHIQUITA CROME on: 05/06/2024 04:30 PM   Modules accepted: Orders

## 2024-05-13 DIAGNOSIS — H26491 Other secondary cataract, right eye: Secondary | ICD-10-CM | POA: Diagnosis not present

## 2024-05-25 ENCOUNTER — Other Ambulatory Visit: Payer: Self-pay | Admitting: Family Medicine

## 2024-05-28 ENCOUNTER — Encounter: Payer: Self-pay | Admitting: Family Medicine

## 2024-06-13 DIAGNOSIS — M47816 Spondylosis without myelopathy or radiculopathy, lumbar region: Secondary | ICD-10-CM | POA: Diagnosis not present

## 2024-06-13 DIAGNOSIS — G894 Chronic pain syndrome: Secondary | ICD-10-CM | POA: Diagnosis not present

## 2024-06-13 DIAGNOSIS — M461 Sacroiliitis, not elsewhere classified: Secondary | ICD-10-CM | POA: Diagnosis not present

## 2024-06-13 DIAGNOSIS — M5136 Other intervertebral disc degeneration, lumbar region with discogenic back pain only: Secondary | ICD-10-CM | POA: Diagnosis not present

## 2024-06-13 DIAGNOSIS — Z79891 Long term (current) use of opiate analgesic: Secondary | ICD-10-CM | POA: Diagnosis not present

## 2024-06-20 ENCOUNTER — Other Ambulatory Visit: Payer: Self-pay | Admitting: Family Medicine

## 2024-06-20 DIAGNOSIS — F5101 Primary insomnia: Secondary | ICD-10-CM

## 2024-06-24 ENCOUNTER — Encounter: Payer: Self-pay | Admitting: Sports Medicine

## 2024-06-26 ENCOUNTER — Ambulatory Visit: Admitting: Urology

## 2024-06-26 ENCOUNTER — Encounter: Payer: Self-pay | Admitting: Urology

## 2024-06-26 VITALS — BP 123/72 | HR 103 | Ht 66.0 in | Wt 154.0 lb

## 2024-06-26 DIAGNOSIS — N390 Urinary tract infection, site not specified: Secondary | ICD-10-CM

## 2024-06-26 DIAGNOSIS — Z87440 Personal history of urinary (tract) infections: Secondary | ICD-10-CM

## 2024-06-26 DIAGNOSIS — R8290 Unspecified abnormal findings in urine: Secondary | ICD-10-CM

## 2024-06-26 LAB — URINALYSIS, ROUTINE W REFLEX MICROSCOPIC
Bilirubin, UA: NEGATIVE
Glucose, UA: NEGATIVE
Ketones, UA: NEGATIVE
Nitrite, UA: POSITIVE — AB
Protein,UA: NEGATIVE
Specific Gravity, UA: 1.015 (ref 1.005–1.030)
Urobilinogen, Ur: 0.2 mg/dL (ref 0.2–1.0)
pH, UA: 5.5 (ref 5.0–7.5)

## 2024-06-26 LAB — MICROSCOPIC EXAMINATION
Epithelial Cells (non renal): NONE SEEN /HPF (ref 0–10)
Mucus, UA: POSITIVE — AB
WBC, UA: >30 /HPF — AB (ref 0–5)

## 2024-06-26 MED ORDER — CIPROFLOXACIN HCL 500 MG PO TABS
500.0000 mg | ORAL_TABLET | Freq: Two times a day (BID) | ORAL | 0 refills | Status: AC
Start: 2024-06-26 — End: 2024-07-01

## 2024-06-26 NOTE — Progress Notes (Signed)
 Assessment: 1. Frequent UTI   2. Abnormal urine findings     Plan: Continue methods to reduce the risk of UTIs including increase fluid intake, timed and double voiding, daily cranberry supplement, daily probiotic, and vaginal hormone cream. Continue the vaginal hormone cream 2-3 times per week. Urine culture sent today. Begin Cipro  500 mg twice daily x 5 days.  Prescription sent. Will recommend daily antibiotic prophylaxis pending culture results. Discontinue methenamine . Return to office in 6 weeks.   Chief Complaint:  Chief Complaint  Patient presents with   Frequent UTI    History of Present Illness:  Carolyn Hendrix is a 83 y.o. female who is seen for continued evaluation of recurrent UTI's. She has a recent history of frequent UTIs beginning in August 2024.  She was diagnosed with a UTI in August 2024 and again in October 2024.  These were diagnosed and treated [facility].  No culture results available. UTI symptoms include lower abdominal pain, low back pain, frequency and urgency.  She does not have dysuria or gross hematuria.  No associated flank pain, fever, or chills.  Urine culture results: 1/24 No growth 11/24 >100K E. Coli 11/24 50-100K Enterococcus 12/24 >100K E. Coli 2/25 >100K E. Coli - treated with Cefdinir , completed on 12/03/2023 5/25 >100K Citrobacter   PVR = 14 ml.  She is status post a hysterectomy for endometriosis. She was started on vaginal hormone cream with Premarin .  She was started on daily cephalexin  for UTI prevention in February 2025. She was doing well on the daily cephalexin  at her visit in April 2025. She discontinued the cephalexin  secondary to diarrhea.  She developed UTI symptoms in mid May 2025.  Urine culture grew >100 K Citrobacter.  She was initially treated with cefdinir  but changed to Cipro .  Urine culture from 03/30/2024 grew >100 K Enterococcus.  Treated with Cipro . She was then started on methenamine  1 g  twice daily for UTI prevention.  She returns today for follow-up.  She continues on methenamine  1 g twice daily and Premarin  vaginal cream. She was not having any symptoms of dysuria, gross hematuria, or flank pain.  She does have some lower back pain.  No change in urine odor or appearance.  No fevers or chills.  Portions of the above documentation were copied from a prior visit for review purposes only.   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 Elsie Nettle   lapartomy  1995   OTHER SURGICAL HISTORY  1992, 1994   right breast biopsy- benign     SPINAL FUSION  08/18/1997   [facility]. Willma [facility]ari at [facility], L4-5 post fusion with iliac crest bone graft   TONSILLECTOMY AND ADENOIDECTOMY      Allergies:  Allergies  Allergen Reactions   Cymbalta  [Duloxetine  Hcl] Diarrhea   Morphine Hives    Family History:  Family History  Problem Relation Age of Onset   Alzheimer's disease Mother    Other Father        ACS? - 97's   Depression Father    Ataxia Sister     Social History:  Social History   Tobacco Use   Smoking status: Former    Current  packs/day: 0.00    Average packs/day: 1.5 packs/day for 10.0 years (15.0 ttl pk-yrs)    Types: Cigarettes    Start date: 10/23/1970    Quit date: 10/23/1980    Years since quitting: 43.7   Smokeless tobacco: Never   Tobacco comments:    Quit 1982  Vaping Use   Vaping status: Never Used  Substance Use Topics   Alcohol use: No   Drug use: No    ROS: Constitutional:  Negative for fever, chills, weight loss CV: Negative for chest pain, previous MI, hypertension Respiratory:  Negative for shortness of breath, wheezing, sleep apnea, frequent cough GI:  Negative for nausea, vomiting, bloody stool, GERD  Physical exam: BP 123/72   Pulse (!) 103   Ht 5' 6 (1.676 m)   Wt 154 lb (69.9 kg)   BMI 24.86 kg/m  GENERAL APPEARANCE:  Well appearing, well developed, well nourished, NAD HEENT:  Atraumatic, normocephalic, oropharynx clear NECK:  Supple without lymphadenopathy or thyromegaly ABDOMEN:  Soft, non-tender, no masses EXTREMITIES:  Moves all extremities well, without clubbing, cyanosis, or edema NEUROLOGIC:  Alert and oriented x 3, normal gait, CN II-XII grossly intact MENTAL STATUS:  appropriate BACK:  Non-tender to palpation, No CVAT SKIN:  Warm, dry, and intact  Results: U/A: >30 WBCs, 3-10 RBCs, many bacteria, nitrite positive

## 2024-06-30 ENCOUNTER — Ambulatory Visit: Payer: Self-pay | Admitting: Urology

## 2024-06-30 DIAGNOSIS — M461 Sacroiliitis, not elsewhere classified: Secondary | ICD-10-CM | POA: Diagnosis not present

## 2024-06-30 DIAGNOSIS — N390 Urinary tract infection, site not specified: Secondary | ICD-10-CM

## 2024-06-30 LAB — URINE CULTURE

## 2024-06-30 MED ORDER — NITROFURANTOIN MACROCRYSTAL 50 MG PO CAPS: 50.0000 mg | ORAL_CAPSULE | Freq: Every day | ORAL | 3 refills | Status: DC

## 2024-08-04 ENCOUNTER — Telehealth: Payer: Self-pay

## 2024-08-04 NOTE — Telephone Encounter (Signed)
 Plesae rec her ot see sports med in [place] or [facility] in [place]

## 2024-08-04 NOTE — Telephone Encounter (Signed)
 Copied from CRM [id number]. Topic: Clinical - Medical Advice >> Aug 04, 2024  4:05 PM Yolanda T wrote: Reason for CRM: Patient said she has pain goes down the front and back of her right shoulder and it pops every time she moves it. She wants to see if Dr Geraline would need to xray her shoulder or if she need to give her an injection. Please f/u with patient

## 2024-08-05 NOTE — Telephone Encounter (Signed)
 Spoke to pt and advised her of recommendations. She stated that she will reach out to an orthopedic surgeon

## 2024-08-06 DIAGNOSIS — M25512 Pain in left shoulder: Secondary | ICD-10-CM | POA: Diagnosis not present

## 2024-08-06 DIAGNOSIS — M25511 Pain in right shoulder: Secondary | ICD-10-CM | POA: Diagnosis not present

## 2024-08-06 DIAGNOSIS — M19011 Primary osteoarthritis, right shoulder: Secondary | ICD-10-CM | POA: Diagnosis not present

## 2024-08-06 DIAGNOSIS — M19012 Primary osteoarthritis, left shoulder: Secondary | ICD-10-CM | POA: Diagnosis not present

## 2024-08-06 DIAGNOSIS — G8929 Other chronic pain: Secondary | ICD-10-CM | POA: Diagnosis not present

## 2024-08-07 ENCOUNTER — Ambulatory Visit: Admitting: Urology

## 2024-08-12 DIAGNOSIS — M19011 Primary osteoarthritis, right shoulder: Secondary | ICD-10-CM | POA: Diagnosis not present

## 2024-08-13 ENCOUNTER — Other Ambulatory Visit: Payer: Self-pay | Admitting: Family Medicine

## 2024-08-13 DIAGNOSIS — M810 Age-related osteoporosis without current pathological fracture: Secondary | ICD-10-CM

## 2024-08-14 ENCOUNTER — Encounter: Payer: Self-pay | Admitting: Urology

## 2024-08-14 ENCOUNTER — Ambulatory Visit: Admitting: Urology

## 2024-08-14 VITALS — BP 170/82 | HR 101

## 2024-08-14 DIAGNOSIS — Z87440 Personal history of urinary (tract) infections: Secondary | ICD-10-CM

## 2024-08-14 DIAGNOSIS — N390 Urinary tract infection, site not specified: Secondary | ICD-10-CM | POA: Diagnosis not present

## 2024-08-14 DIAGNOSIS — Z09 Encounter for follow-up examination after completed treatment for conditions other than malignant neoplasm: Secondary | ICD-10-CM

## 2024-08-14 LAB — URINALYSIS, ROUTINE W REFLEX MICROSCOPIC
Bilirubin, UA: NEGATIVE
Glucose, UA: NEGATIVE
Ketones, UA: NEGATIVE
Leukocytes,UA: NEGATIVE
Nitrite, UA: NEGATIVE
Protein,UA: NEGATIVE
Specific Gravity, UA: 1.02 (ref 1.005–1.030)
Urobilinogen, Ur: 0.2 mg/dL (ref 0.2–1.0)
pH, UA: 7 (ref 5.0–7.5)

## 2024-08-14 LAB — MICROSCOPIC EXAMINATION

## 2024-08-14 NOTE — Progress Notes (Signed)
 Assessment: 1. Frequent UTI     Plan: Continue methods to reduce the risk of UTIs including increase fluid intake, timed and double voiding, daily cranberry supplement, daily probiotic, and vaginal hormone cream. Continue the vaginal hormone cream 2-3 times per week. Continue nitrofurantoin  50 mg daily.   Return to office in 2 months   Chief Complaint:  Chief Complaint  Patient presents with   Frequent UTI    History of Present Illness:  Carolyn Hendrix is a 83 y.o. female who is seen for continued evaluation of recurrent UTI's. She has a recent history of frequent UTIs beginning in August 2024.  She was diagnosed with a UTI in August 2024 and again in October 2024.  These [facility] diagnosed and [facility].  No culture results available. UTI symptoms include lower abdominal pain, low back pain, frequency and urgency.  She does not have dysuria or gross hematuria.  No associated flank pain, fever, or chills.  Urine culture results: 1/24 No growth 11/24 >100K E. Coli 11/24 50-100K Enterococcus 12/24 >100K E. Coli 2/25 >100K E. Coli - treated with Cefdinir , completed on 12/03/2023 5/25 >100K Citrobacter   PVR = 14 ml.  She is status post a hysterectomy for endometriosis. She was started on vaginal hormone cream with Premarin .  She was started on daily cephalexin  for UTI prevention in February 2025. She was doing well on the daily cephalexin  at her visit in April 2025. She discontinued the cephalexin  secondary to diarrhea.  She developed UTI symptoms in mid May 2025.  Urine culture grew >100 K Citrobacter.  She was initially treated with cefdinir  but changed to Cipro .  Urine culture from 03/30/2024 grew >100 K Enterococcus.  Treated with Cipro . She was then started on methenamine  1 g twice daily for UTI prevention.  At her visit in September 2025, she continued on methenamine  1 g twice daily and Premarin  vaginal cream. She was not having any symptoms of  dysuria, gross hematuria, or flank pain.  She would ordered some lower back pain.  No change in urine odor or appearance.  No fevers or chills. Urine culture grew >100 K E. coli.  She was treated with Cipro  x 5 days and started on daily Macrobid  50 mg for UTI prevention.  Portions of the above documentation were copied from a prior visit for review purposes only.   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 Elsie Nettle   lapartomy  1995   OTHER SURGICAL HISTORY  1992, 1994   right breast biopsy- benign     SPINAL FUSION  08/18/1997   Dr. Willma Harari at [facility], L4-5 post fusion with iliac crest bone graft   TONSILLECTOMY AND ADENOIDECTOMY      Allergies:  Allergies  Allergen Reactions   Cymbalta  [Duloxetine  Hcl] Diarrhea   Morphine Hives    Family History:  Family History  Problem Relation Age of Onset   Alzheimer's disease Mother    Other Father        ACS? - 12's   Depression Father    Ataxia Sister     Social History:  Social History   Tobacco Use   Smoking status: Former    Current packs/day: 0.00    Average packs/day: 1.5 packs/day for  10.0 years (15.0 ttl pk-yrs)    Types: Cigarettes    Start date: 10/23/1970    Quit date: 10/23/1980    Years since quitting: 43.8   Smokeless tobacco: Never   Tobacco comments:    Quit 1982  Vaping Use   Vaping status: Never Used  Substance Use Topics   Alcohol use: No   Drug use: No    ROS: Constitutional:  Negative for fever, chills, weight loss CV: Negative for chest pain, previous MI, hypertension Respiratory:  Negative for shortness of breath, wheezing, sleep apnea, frequent cough GI:  Negative for nausea, vomiting, bloody stool, GERD  Physical exam: BP (!) 170/82   Pulse (!) 101  GENERAL APPEARANCE:  Well appearing, well developed, well nourished, NAD HEENT:  Atraumatic, normocephalic, oropharynx clear NECK:  Supple without lymphadenopathy or thyromegaly ABDOMEN:  Soft, non-tender, no masses EXTREMITIES:  Moves all extremities well, without clubbing, cyanosis, or edema NEUROLOGIC:  Alert and oriented x 3, normal gait, CN II-XII grossly intact MENTAL STATUS:  appropriate BACK:  Non-tender to palpation, No CVAT SKIN:  Warm, dry, and intact  Results: U/A: 0-5 WBCs, 3-10 RBCs

## 2024-08-19 DIAGNOSIS — M19012 Primary osteoarthritis, left shoulder: Secondary | ICD-10-CM | POA: Diagnosis not present

## 2024-08-28 ENCOUNTER — Ambulatory Visit (INDEPENDENT_AMBULATORY_CARE_PROVIDER_SITE_OTHER): Admitting: Family Medicine

## 2024-08-28 ENCOUNTER — Encounter: Payer: Self-pay | Admitting: Family Medicine

## 2024-08-28 VITALS — BP 128/69 | HR 92 | Ht 66.0 in | Wt 154.0 lb

## 2024-08-28 DIAGNOSIS — Z23 Encounter for immunization: Secondary | ICD-10-CM | POA: Diagnosis not present

## 2024-08-28 DIAGNOSIS — F5101 Primary insomnia: Secondary | ICD-10-CM | POA: Diagnosis not present

## 2024-08-28 DIAGNOSIS — F331 Major depressive disorder, recurrent, moderate: Secondary | ICD-10-CM

## 2024-08-28 DIAGNOSIS — N182 Chronic kidney disease, stage 2 (mild): Secondary | ICD-10-CM

## 2024-08-28 MED ORDER — HYDROXYZINE PAMOATE 25 MG PO CAPS: 25.0000 mg | ORAL_CAPSULE | Freq: Two times a day (BID) | ORAL | 0 refills | Status: AC | PRN

## 2024-08-28 NOTE — Progress Notes (Signed)
 Established Patient Office Visit  Patient ID: Carolyn Hendrix, female    DOB: [birth year]  Age: 83 y.o. MRN: [id number] PCP: Alvan Dorothyann BIRCH, MD  Chief Complaint  Patient presents with   mood    Subjective:     HPI  Discussed the use of AI scribe software for clinical note transcription with the patient, who gave verbal consent to proceed.  History of Present Illness Carolyn Hendrix is an 83 year old female who presents with anxiety and depression management.  Depressive symptoms - Ongoing depressed mood with lack of interest in activities - Difficulty engaging in previously enjoyable activities - Considering increasing Zoloft  from 150 mg to 200 mg daily, especially during winter and holiday season - Grieving the recent loss of her pet, which has left a significant void in her life - Feels reclusive and is considering reconnecting with a church group to improve social interactions - Finds some solace in visiting family and reading more  Anxiety symptoms - Experiences anxiety episodes that sometimes cause a sensation of being unable to breathe - Anxiety leads to crying spells - Feels overwhelmed by her symptoms  Sleep disturbance and dizziness - Takes trazodone  50 mg at night for sleep - Experiences morning dizziness, attributed to either trazodone  or sinus issues - Takes Robaxin at night and in the morning, considering discontinuing to assess its impact on dizziness  Blood pressure variability - Recent blood pressure readings of 139/59 and 145/59 - Currently on 25 mg of an unspecified antihypertensive medication - Previously prescribed hydralazine, cannot recall its effects, open to retrying if needed     ROS    Objective:     BP 128/69   Pulse 92   Ht 5' 6 (1.676 m)   Wt 154 lb (69.9 kg)   SpO2 97%   BMI 24.86 kg/m    Physical Exam Vitals and nursing note reviewed.  Constitutional:      Appearance: Normal appearance.  HENT:     Head:  Normocephalic and atraumatic.  Eyes:     Conjunctiva/sclera: Conjunctivae normal.  Cardiovascular:     Rate and Rhythm: Normal rate and regular rhythm.  Pulmonary:     Effort: Pulmonary effort is normal.     Breath sounds: Normal breath sounds.  Skin:    General: Skin is warm and dry.  Neurological:     Mental Status: She is alert.  Psychiatric:        Mood and Affect: Mood normal.      No results found for any visits on 08/28/24.    The ASCVD Risk score (Arnett DK, et al., 2019) failed to calculate for the following reasons:   The 2019 ASCVD risk score is only valid for ages 47 to 20    Assessment & Plan:   Problem List Items Addressed This Visit       Genitourinary   CKD (chronic kidney disease) stage 2, GFR 60-89 ml/min - Primary     Other   Primary insomnia   Encouraged her to hold trazodone  or at least cut in half to see if AM dizziness is better       MDD (major depressive disorder), recurrent episode, moderate (HCC)   Relevant Medications   hydrOXYzine  (VISTARIL ) 25 MG capsule   Other Visit Diagnoses       Encounter for immunization       Relevant Orders   Flu vaccine HIGH DOSE PF(Fluzone Trivalent) (Completed)       Assessment  and Plan Assessment & Plan Depression Chronic depression with decreased interest in activities, especially during winter and holidays. Currently on Zoloft  150 mg. - Increased Zoloft  to 200 mg daily through the end of January or February, depending on response. - Will reassess Zoloft  dosage after winter and holidays.  Anxiety Episodes of feeling overwhelmed and crying. Discussed non-benzodiazepine options for acute anxiety management. Hydralazine considered as a potential option. - Prescribed hydralazine 20 mg for acute anxiety episodes. - Instructed to discontinue hydralazine if not effective or if adverse effects occur.  General Health Maintenance Due for flu vaccination. Discussed importance of staying active and  engaged with family and community to support mental health. - Administered high-dose flu vaccine. - Encouraged participation in social activities and family visits.    Return in about 3 months (around 11/28/2024) for Mood.    Dorothyann Byars, MD [facility] at [facility]

## 2024-08-28 NOTE — Patient Instructions (Signed)
 OK to increase sertraline  to 200mg  daily for the HOliday.  SABRA

## 2024-08-28 NOTE — Assessment & Plan Note (Signed)
 Encouraged her to hold trazodone  or at least cut in half to see if AM dizziness is better

## 2024-08-29 ENCOUNTER — Other Ambulatory Visit: Payer: Self-pay | Admitting: Family Medicine

## 2024-09-16 ENCOUNTER — Other Ambulatory Visit: Payer: Self-pay | Admitting: Family Medicine

## 2024-09-16 DIAGNOSIS — F331 Major depressive disorder, recurrent, moderate: Secondary | ICD-10-CM

## 2024-09-17 ENCOUNTER — Other Ambulatory Visit: Payer: Self-pay | Admitting: Family Medicine

## 2024-09-17 DIAGNOSIS — F339 Major depressive disorder, recurrent, unspecified: Secondary | ICD-10-CM

## 2024-10-05 ENCOUNTER — Other Ambulatory Visit: Payer: Self-pay | Admitting: Family Medicine

## 2024-10-05 DIAGNOSIS — E7800 Pure hypercholesterolemia, unspecified: Secondary | ICD-10-CM

## 2024-10-13 ENCOUNTER — Encounter: Payer: Self-pay | Admitting: Family Medicine

## 2024-10-13 MED ORDER — OSELTAMIVIR PHOSPHATE 75 MG PO CAPS: 75.0000 mg | ORAL_CAPSULE | Freq: Every day | ORAL | 0 refills | Status: AC

## 2024-10-22 ENCOUNTER — Ambulatory Visit: Admitting: Urology

## 2024-10-22 ENCOUNTER — Encounter: Payer: Self-pay | Admitting: Urology

## 2024-10-22 VITALS — BP 135/75 | HR 102

## 2024-10-22 DIAGNOSIS — N390 Urinary tract infection, site not specified: Secondary | ICD-10-CM

## 2024-10-22 DIAGNOSIS — N3941 Urge incontinence: Secondary | ICD-10-CM

## 2024-10-22 DIAGNOSIS — R8290 Unspecified abnormal findings in urine: Secondary | ICD-10-CM

## 2024-10-22 DIAGNOSIS — Z87440 Personal history of urinary (tract) infections: Secondary | ICD-10-CM | POA: Diagnosis not present

## 2024-10-22 LAB — URINALYSIS, ROUTINE W REFLEX MICROSCOPIC
Bilirubin, UA: NEGATIVE
Glucose, UA: NEGATIVE
Ketones, UA: NEGATIVE
Nitrite, UA: NEGATIVE
Protein,UA: NEGATIVE
Specific Gravity, UA: 1.01 (ref 1.005–1.030)
Urobilinogen, Ur: 0.2 mg/dL (ref 0.2–1.0)
pH, UA: 6 (ref 5.0–7.5)

## 2024-10-22 LAB — MICROSCOPIC EXAMINATION

## 2024-10-22 MED ORDER — NITROFURANTOIN MACROCRYSTAL 50 MG PO CAPS: 50.0000 mg | ORAL_CAPSULE | Freq: Every day | ORAL | 3 refills | Status: AC

## 2024-10-22 NOTE — Progress Notes (Signed)
 "  Assessment: 1. Frequent UTI   2. Abnormal urine findings   3. Urge incontinence     Plan: Continue methods to reduce the risk of UTIs including increase fluid intake, timed and double voiding, daily cranberry supplement, daily probiotic, and vaginal hormone cream. Continue the vaginal hormone cream 2-3 times per week. Continue nitrofurantoin  50 mg daily.   Urine culture sent today.  Will hold off on treatment as she is not currently symptomatic. I discussed options for management of her urge incontinence including avoidance of dietary irritants, behavioral modification, and medical therapy.  She would like to monitor her symptoms at this time. Return to office in 3 months   Chief Complaint:  Chief Complaint  Patient presents with   Frequent UTI    History of Present Illness:  Carolyn Hendrix is a 83 y.o. female who is seen for continued evaluation of recurrent UTI's. She has a recent history of frequent UTIs beginning in August 2024.  She was diagnosed with a UTI in August 2024 and again in October 2024.  These were diagnosed and treated through [facility].  No culture results available. UTI symptoms include lower abdominal pain, low back pain, frequency and urgency.  She does not have dysuria or gross hematuria.  No associated flank pain, fever, or chills.  Urine culture results: 1/24 No growth 11/24 >100K E. Coli 11/24 50-100K Enterococcus 12/24 >100K E. Coli 2/25 >100K E. Coli - treated with Cefdinir , completed on 12/03/2023 5/25 >100K Citrobacter   PVR = 14 ml.  She is status post a hysterectomy for endometriosis. She was started on vaginal hormone cream with Premarin .  She was started on daily cephalexin  for UTI prevention in February 2025. She was doing well on the daily cephalexin  at her visit in April 2025. She discontinued the cephalexin  secondary to diarrhea.  She developed UTI symptoms in mid May 2025.  Urine culture grew >100 K Citrobacter.  She was  initially treated with cefdinir  but changed to Cipro .  Urine culture from 03/30/2024 grew >100 K Enterococcus.  Treated with Cipro . She was then started on methenamine  1 g twice daily for UTI prevention.  At her visit in September 2025, she continued on methenamine  1 g twice daily and Premarin  vaginal cream. She was not having any symptoms of dysuria, gross hematuria, or flank pain.  She would ordered some lower back pain.  No change in urine odor or appearance.  No fevers or chills. Urine culture grew >100 K E. coli.  She was treated with Cipro  x 5 days and started on daily Macrobid  50 mg for UTI prevention.  At her visit in October 2025, she was doing well on UTI prevention as well as the daily Macrobid .  She returns today for scheduled follow-up.  She continues on Macrodantin  50 mg daily.  No recent UTI symptoms.  No dysuria or gross hematuria. She has noted some gradual worsening of urgency and some urge incontinence, primarily at night.  She has used a incontinence pad.  Portions of the above documentation were copied from a prior visit for review purposes only.   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 Elsie Nettle   lapartomy  1995   OTHER SURGICAL HISTORY  1992, 1994   right breast biopsy- benign     SPINAL FUSION  08/18/1997   Dr. Willma Harari at [facility], L4-5 post fusion with iliac crest bone graft   TONSILLECTOMY AND ADENOIDECTOMY      Allergies:  Allergies  Allergen Reactions   Cymbalta  [Duloxetine  Hcl] Diarrhea   Morphine Hives    Family History:  Family History  Problem Relation Age of Onset   Alzheimer's disease Mother    Other Father        ACS? - 61's   Depression Father    Ataxia Sister     Social History:  Social History   Tobacco Use   Smoking status: Former    Current packs/day: 0.00    Average packs/day: 1.5 packs/day for 10.0 years (15.0 ttl pk-yrs)    Types: Cigarettes    Start date: 10/23/1970    Quit date: 10/23/1980    Years since quitting: 44.0   Smokeless tobacco: Never   Tobacco comments:    Quit 1982  Vaping Use   Vaping status: Never Used  Substance Use Topics   Alcohol use: No   Drug use: No    ROS: Constitutional:  Negative for fever, chills, weight loss CV: Negative for chest pain, previous MI, hypertension Respiratory:  Negative for shortness of breath, wheezing, sleep apnea, frequent cough GI:  Negative for nausea, vomiting, bloody stool, GERD  Physical exam: BP 135/75   Pulse (!) 102  GENERAL APPEARANCE:  Well appearing, well developed, well nourished, NAD HEENT:  Atraumatic, normocephalic, oropharynx clear NECK:  Supple without lymphadenopathy or thyromegaly ABDOMEN:  Soft, non-tender, no masses EXTREMITIES:  Moves all extremities well, without clubbing, cyanosis, or edema NEUROLOGIC:  Alert and oriented x 3, normal gait, CN II-XII grossly intact MENTAL STATUS:  appropriate BACK:  Non-tender to palpation, No CVAT SKIN:  Warm, dry, and intact  Results: U/A: 6-10 WBCs, 3-10 RBCs, moderate bacteria, nitrite negative "

## 2024-10-24 ENCOUNTER — Ambulatory Visit: Payer: Self-pay | Admitting: Urology

## 2024-10-24 LAB — URINE CULTURE

## 2024-12-01 ENCOUNTER — Ambulatory Visit: Admitting: Family Medicine

## 2025-01-21 ENCOUNTER — Ambulatory Visit: Admitting: Urology

## 2025-03-17 ENCOUNTER — Ambulatory Visit
